# Patient Record
Sex: Female | Born: 1970 | Race: Black or African American | Hispanic: No | Marital: Single | Smoking: Former smoker
Health system: Southern US, Community
[De-identification: ages and names within clinical notes are randomized; demographics above are authoritative.]

## PROBLEM LIST (undated history)

## (undated) DIAGNOSIS — K219 Gastro-esophageal reflux disease without esophagitis: Secondary | ICD-10-CM

## (undated) DIAGNOSIS — I10 Essential (primary) hypertension: Secondary | ICD-10-CM

## (undated) DIAGNOSIS — I420 Dilated cardiomyopathy: Secondary | ICD-10-CM

## (undated) DIAGNOSIS — J302 Other seasonal allergic rhinitis: Secondary | ICD-10-CM

## (undated) DIAGNOSIS — M47816 Spondylosis without myelopathy or radiculopathy, lumbar region: Secondary | ICD-10-CM

## (undated) DIAGNOSIS — I509 Heart failure, unspecified: Secondary | ICD-10-CM

## (undated) DIAGNOSIS — Z5189 Encounter for other specified aftercare: Secondary | ICD-10-CM

## (undated) DIAGNOSIS — M479 Spondylosis, unspecified: Secondary | ICD-10-CM

## (undated) DIAGNOSIS — M47819 Spondylosis without myelopathy or radiculopathy, site unspecified: Secondary | ICD-10-CM

## (undated) HISTORY — DX: Spondylosis without myelopathy or radiculopathy, site unspecified: M47.819

## (undated) HISTORY — DX: Encounter for other specified aftercare: Z51.89

## (undated) HISTORY — DX: Spondylosis without myelopathy or radiculopathy, lumbar region: M47.816

## (undated) HISTORY — DX: Spondylosis, unspecified: M47.9

## (undated) HISTORY — PX: TUBAL LIGATION: SHX77

## (undated) HISTORY — PX: ABDOMINAL HYSTERECTOMY: SHX81

## (undated) HISTORY — PX: SPINE SURGERY: SHX786

---

## 1998-01-28 ENCOUNTER — Encounter: Admission: RE | Admit: 1998-01-28 | Discharge: 1998-04-28 | Payer: Self-pay | Admitting: Orthopedic Surgery

## 1999-03-15 ENCOUNTER — Other Ambulatory Visit: Admission: RE | Admit: 1999-03-15 | Discharge: 1999-03-15 | Payer: Self-pay | Admitting: *Deleted

## 1999-04-11 HISTORY — PX: CHOLECYSTECTOMY: SHX55

## 2000-01-05 ENCOUNTER — Encounter: Payer: Self-pay | Admitting: Gastroenterology

## 2000-01-05 ENCOUNTER — Ambulatory Visit (HOSPITAL_COMMUNITY): Admission: RE | Admit: 2000-01-05 | Discharge: 2000-01-05 | Payer: Self-pay | Admitting: Gastroenterology

## 2000-01-24 ENCOUNTER — Observation Stay (HOSPITAL_COMMUNITY): Admission: RE | Admit: 2000-01-24 | Discharge: 2000-01-25 | Payer: Self-pay | Admitting: *Deleted

## 2000-01-24 ENCOUNTER — Encounter (INDEPENDENT_AMBULATORY_CARE_PROVIDER_SITE_OTHER): Payer: Self-pay

## 2000-04-10 HISTORY — PX: CERVICAL SPINE SURGERY: SHX589

## 2000-04-24 ENCOUNTER — Other Ambulatory Visit: Admission: RE | Admit: 2000-04-24 | Discharge: 2000-04-24 | Payer: Self-pay | Admitting: *Deleted

## 2000-08-22 ENCOUNTER — Encounter: Admission: RE | Admit: 2000-08-22 | Discharge: 2000-09-13 | Payer: Self-pay | Admitting: Orthopedic Surgery

## 2000-09-09 ENCOUNTER — Ambulatory Visit (HOSPITAL_COMMUNITY): Admission: RE | Admit: 2000-09-09 | Discharge: 2000-09-09 | Payer: Self-pay | Admitting: Orthopedic Surgery

## 2000-09-09 ENCOUNTER — Encounter: Payer: Self-pay | Admitting: Orthopedic Surgery

## 2000-09-25 ENCOUNTER — Inpatient Hospital Stay (HOSPITAL_COMMUNITY): Admission: RE | Admit: 2000-09-25 | Discharge: 2000-09-26 | Payer: Self-pay | Admitting: Neurosurgery

## 2000-09-25 ENCOUNTER — Encounter: Payer: Self-pay | Admitting: Neurosurgery

## 2000-10-09 ENCOUNTER — Encounter: Payer: Self-pay | Admitting: Neurosurgery

## 2000-10-09 ENCOUNTER — Encounter: Admission: RE | Admit: 2000-10-09 | Discharge: 2000-10-09 | Payer: Self-pay | Admitting: Neurosurgery

## 2004-03-01 ENCOUNTER — Ambulatory Visit (HOSPITAL_COMMUNITY): Admission: RE | Admit: 2004-03-01 | Discharge: 2004-03-01 | Payer: Self-pay | Admitting: Obstetrics & Gynecology

## 2004-05-11 ENCOUNTER — Ambulatory Visit (HOSPITAL_COMMUNITY): Admission: RE | Admit: 2004-05-11 | Discharge: 2004-05-11 | Payer: Self-pay | Admitting: Obstetrics and Gynecology

## 2004-05-29 ENCOUNTER — Ambulatory Visit (HOSPITAL_COMMUNITY): Admission: RE | Admit: 2004-05-29 | Discharge: 2004-05-29 | Payer: Self-pay | Admitting: Obstetrics and Gynecology

## 2004-08-16 ENCOUNTER — Inpatient Hospital Stay (HOSPITAL_COMMUNITY): Admission: AD | Admit: 2004-08-16 | Discharge: 2004-08-19 | Payer: Self-pay | Admitting: Obstetrics and Gynecology

## 2004-12-04 ENCOUNTER — Emergency Department (HOSPITAL_COMMUNITY): Admission: EM | Admit: 2004-12-04 | Discharge: 2004-12-04 | Payer: Self-pay | Admitting: Emergency Medicine

## 2004-12-08 ENCOUNTER — Ambulatory Visit (HOSPITAL_COMMUNITY): Admission: RE | Admit: 2004-12-08 | Discharge: 2004-12-08 | Payer: Self-pay | Admitting: Orthopaedic Surgery

## 2006-07-24 ENCOUNTER — Emergency Department (HOSPITAL_COMMUNITY): Admission: EM | Admit: 2006-07-24 | Discharge: 2006-07-24 | Payer: Self-pay | Admitting: Emergency Medicine

## 2006-09-12 ENCOUNTER — Ambulatory Visit (HOSPITAL_COMMUNITY): Admission: RE | Admit: 2006-09-12 | Discharge: 2006-09-12 | Payer: Self-pay | Admitting: Obstetrics & Gynecology

## 2006-12-06 ENCOUNTER — Ambulatory Visit: Payer: Self-pay | Admitting: Physician Assistant

## 2006-12-06 ENCOUNTER — Inpatient Hospital Stay (HOSPITAL_COMMUNITY): Admission: AD | Admit: 2006-12-06 | Discharge: 2006-12-06 | Payer: Self-pay | Admitting: Obstetrics and Gynecology

## 2006-12-14 ENCOUNTER — Other Ambulatory Visit: Admission: RE | Admit: 2006-12-14 | Discharge: 2006-12-14 | Payer: Self-pay | Admitting: Obstetrics and Gynecology

## 2007-01-04 ENCOUNTER — Inpatient Hospital Stay (HOSPITAL_COMMUNITY): Admission: AD | Admit: 2007-01-04 | Discharge: 2007-01-04 | Payer: Self-pay | Admitting: Obstetrics and Gynecology

## 2007-02-25 ENCOUNTER — Ambulatory Visit: Payer: Self-pay | Admitting: Obstetrics & Gynecology

## 2007-02-25 ENCOUNTER — Inpatient Hospital Stay (HOSPITAL_COMMUNITY): Admission: AD | Admit: 2007-02-25 | Discharge: 2007-02-26 | Payer: Self-pay | Admitting: Obstetrics & Gynecology

## 2007-03-04 ENCOUNTER — Ambulatory Visit: Payer: Self-pay | Admitting: Physician Assistant

## 2007-03-04 ENCOUNTER — Inpatient Hospital Stay (HOSPITAL_COMMUNITY): Admission: AD | Admit: 2007-03-04 | Discharge: 2007-03-06 | Payer: Self-pay | Admitting: Obstetrics and Gynecology

## 2007-03-14 ENCOUNTER — Inpatient Hospital Stay (HOSPITAL_COMMUNITY): Admission: AD | Admit: 2007-03-14 | Discharge: 2007-03-15 | Payer: Self-pay | Admitting: Obstetrics and Gynecology

## 2007-04-29 ENCOUNTER — Ambulatory Visit (HOSPITAL_COMMUNITY): Admission: RE | Admit: 2007-04-29 | Discharge: 2007-04-29 | Payer: Self-pay | Admitting: Obstetrics & Gynecology

## 2007-05-05 ENCOUNTER — Emergency Department (HOSPITAL_COMMUNITY): Admission: EM | Admit: 2007-05-05 | Discharge: 2007-05-05 | Payer: Self-pay | Admitting: Emergency Medicine

## 2007-12-26 ENCOUNTER — Other Ambulatory Visit: Admission: RE | Admit: 2007-12-26 | Discharge: 2007-12-26 | Payer: Self-pay | Admitting: Obstetrics & Gynecology

## 2008-03-19 DIAGNOSIS — I509 Heart failure, unspecified: Secondary | ICD-10-CM

## 2008-03-19 HISTORY — DX: Heart failure, unspecified: I50.9

## 2008-05-05 ENCOUNTER — Encounter: Payer: Self-pay | Admitting: Internal Medicine

## 2008-05-11 ENCOUNTER — Encounter: Payer: Self-pay | Admitting: Internal Medicine

## 2008-06-08 ENCOUNTER — Encounter: Payer: Self-pay | Admitting: Internal Medicine

## 2009-01-25 ENCOUNTER — Other Ambulatory Visit: Admission: RE | Admit: 2009-01-25 | Discharge: 2009-01-25 | Payer: Self-pay | Admitting: Obstetrics and Gynecology

## 2009-02-04 ENCOUNTER — Emergency Department (HOSPITAL_COMMUNITY): Admission: EM | Admit: 2009-02-04 | Discharge: 2009-02-04 | Payer: Self-pay | Admitting: Emergency Medicine

## 2009-06-07 ENCOUNTER — Emergency Department (HOSPITAL_COMMUNITY): Admission: EM | Admit: 2009-06-07 | Discharge: 2009-06-07 | Payer: Self-pay | Admitting: Emergency Medicine

## 2010-06-29 LAB — URINE CULTURE: Colony Count: >=100000

## 2010-06-29 LAB — URINE MICROSCOPIC-ADD ON

## 2010-06-29 LAB — BASIC METABOLIC PANEL
Creatinine, Ser: 0.79 mg/dL (ref 0.4–1.2)
GFR calc Af Amer: >60 mL/min (ref >60)
Sodium: 133 mEq/L — ABNORMAL LOW (ref 135–145)

## 2010-06-29 LAB — DIFFERENTIAL
Basophils Absolute: 0 10*3/uL (ref 0.0–0.1)
Eosinophils Absolute: 0 10*3/uL (ref 0.0–0.7)
Lymphs Abs: 1.1 10*3/uL (ref 0.7–4.0)
Monocytes Absolute: 0.9 10*3/uL (ref 0.1–1.0)
Monocytes Relative: 6 % (ref 3–12)

## 2010-06-29 LAB — URINALYSIS, ROUTINE W REFLEX MICROSCOPIC
Bilirubin Urine: NEGATIVE
Protein, ur: 100 mg/dL — AB
pH: 6.5 (ref 5.0–8.0)

## 2010-06-29 LAB — RAPID URINE DRUG SCREEN, HOSP PERFORMED
Amphetamines: NOT DETECTED
Barbiturates: NOT DETECTED
Benzodiazepines: POSITIVE — AB
Cocaine: NOT DETECTED

## 2010-06-29 LAB — HEPATIC FUNCTION PANEL: Albumin: 3.4 g/dL — ABNORMAL LOW (ref 3.5–5.2)

## 2010-06-29 LAB — LIPASE, BLOOD: Lipase: 14 U/L (ref 11–59)

## 2010-06-29 LAB — CBC
Hemoglobin: 14.9 g/dL (ref 12.0–15.0)
MCHC: 33.8 g/dL (ref 30.0–36.0)
MCV: 90.6 fL (ref 78.0–100.0)
Platelets: 229 10*3/uL (ref 150–400)
RDW: 14.6 % (ref 11.5–15.5)

## 2010-08-02 ENCOUNTER — Emergency Department (HOSPITAL_COMMUNITY)
Admission: EM | Admit: 2010-08-02 | Discharge: 2010-08-02 | Disposition: A | Discharge status: Home / Self Care | Payer: Medicaid Other | Attending: Emergency Medicine | Admitting: Emergency Medicine

## 2010-08-02 DIAGNOSIS — Z79899 Other long term (current) drug therapy: Secondary | ICD-10-CM | POA: Insufficient documentation

## 2010-08-02 DIAGNOSIS — I10 Essential (primary) hypertension: Secondary | ICD-10-CM | POA: Insufficient documentation

## 2010-08-02 DIAGNOSIS — I509 Heart failure, unspecified: Secondary | ICD-10-CM | POA: Insufficient documentation

## 2010-08-02 DIAGNOSIS — N949 Unspecified condition associated with female genital organs and menstrual cycle: Secondary | ICD-10-CM | POA: Insufficient documentation

## 2010-08-02 LAB — DIFFERENTIAL
Basophils Absolute: 0 10*3/uL (ref 0.0–0.1)
Eosinophils Absolute: 0.1 10*3/uL (ref 0.0–0.7)
Lymphocytes Relative: 45 % (ref 12–46)
Monocytes Absolute: 0.4 10*3/uL (ref 0.1–1.0)
Neutrophils Relative %: 46 % (ref 43–77)

## 2010-08-02 LAB — URINALYSIS, ROUTINE W REFLEX MICROSCOPIC
Bilirubin Urine: NEGATIVE
Glucose, UA: NEGATIVE mg/dL
Leukocytes, UA: NEGATIVE
Nitrite: NEGATIVE
Urobilinogen, UA: 0.2 mg/dL (ref 0.0–1.0)
pH: 6.5 (ref 5.0–8.0)

## 2010-08-02 LAB — CBC
Hemoglobin: 12.7 g/dL (ref 12.0–15.0)
MCH: 30 pg (ref 26.0–34.0)
MCHC: 34.2 g/dL (ref 30.0–36.0)
Platelets: 249 10*3/uL (ref 150–400)
RBC: 4.23 MIL/uL (ref 3.87–5.11)

## 2010-08-02 LAB — URINE MICROSCOPIC-ADD ON

## 2010-08-02 LAB — LIPASE, BLOOD: Lipase: 28 U/L (ref 11–59)

## 2010-08-02 LAB — BASIC METABOLIC PANEL
Chloride: 105 mEq/L (ref 96–112)
Creatinine, Ser: 0.83 mg/dL (ref 0.4–1.2)
Sodium: 137 mEq/L (ref 135–145)

## 2010-08-02 LAB — WET PREP, GENITAL
Trich, Wet Prep: NONE SEEN
Yeast Wet Prep HPF POC: NONE SEEN

## 2010-08-03 LAB — GC/CHLAMYDIA PROBE AMP, GENITAL: GC Probe Amp, Genital: NEGATIVE

## 2010-08-23 NOTE — Discharge Summary (Signed)
NAMEDANYIEL, CRESPIN                ACCOUNT NO.:  [id number]   MEDICAL RECORD NO.:  [id number]          PATIENT TYPE:  INP   LOCATION:  A310                          FACILITY:  [facility]   PHYSICIAN:  Tilda Burrow, M.D. DATE OF BIRTH:  24-Jul-1970   DATE OF ADMISSION:  03/14/2007  DATE OF DISCHARGE:  12/05/2008LH                               DISCHARGE SUMMARY   ADMISSION DIAGNOSIS:  Postpartum endometritis.   POSTOPERATIVE DIAGNOSIS:  Postpartum endometritis, improved.   DISCHARGE MEDICATIONS:  1. Cleocin 300 mg p.o. q.6h. x5 days.  2. Keflex 500 mg p.o. q.i.d. x5 days.   Follow up 3 days [facility] at 9 a.m.   HOSPITAL SUMMARY:  This 40 year old female was admitted from the office  with uterine tenderness and mucopurulent discharge.  White count was  12,000 and sed rate was in the 90s.  She received intravenous cefotetan  and Cleocin 600 mg q.6h. IV q.6h.  Within 24 hours there was dramatic  improvement in uterine and lower abdominal tenderness.  She was afebrile  except for a single temperature elevation at admission.  She was  tolerating a regular diet and bowel sounds were active, so she is sent  home at 3 p.m. on the 5th to follow up on Monday in our office with her  other medicines resumed as per prior to the admission.  Those include  prenatal vitamins daily, stool softener daily, iron tablets daily,  labetalol 100 mg b.i.d., and ibuprofen as needed for pain.      Tilda Burrow, M.D.  Electronically Signed     JVF/MEDQ  D:  03/15/2007  T:  03/15/2007  Job:  [id number]   cc:   [facility]

## 2010-08-23 NOTE — H&P (Signed)
Destiny Manning, Destiny Manning                ACCOUNT NO.:  [id number]   MEDICAL RECORD NO.:  [id number]          PATIENT TYPE:  INP   LOCATION:  A310                          FACILITY:  [facility]   PHYSICIAN:  Tilda Burrow, M.D. DATE OF BIRTH:  02-02-1971   DATE OF ADMISSION:  DATE OF DISCHARGE:  LH                              HISTORY & PHYSICAL   ADMISSION DIAGNOSIS:  Postpartum endometritis.   This 40 year old female now 1-1/2 weeks status post recent vaginal  delivery complicated by postpartum hemorrhage and need for manual  removal of extensive blood clots as well as transfusion of two units of  packed cells who was admitted after being seen in our office and found  to have marked uterine tenderness, abdominal guarding and equivocal  rebound tenderness with mucopurulent cervical discharge and cervical  motion tenderness.  She has been having mild chills at home.  Says the  pain had a dramatic onset on November 31, 2008 and persisting.  She has  been having chills, but has not taken her temperature with a  thermometer.  She has not had a bowel movement since she went home from  the hospital and does not recall having a bowel movement while in the  hospital.  She is tolerating a regular diet.  The patient will go home  to make arrangements for child care and will return for admission and IV  antibiotics.   PAST MEDICAL HISTORY:  1. Chronic hypertension requiring Aldomet during pregnancy as well as      Labetalol.  2. Pregnancy notable in that she had a McDonald cerclage which was      removed at [redacted] weeks gestation.   PRENATAL LABS:  Reviewed and are specifically notable for blood type B  positive, hemoglobin 14, hematocrit 43 initially dropping to 10.6 and  32.7 at 28 weeks.  She had negative cultures for gonorrhea, chlamydia  and had group B strep positive culture late in pregnancy and was treated  with antibiotics while she was in labor.   REVIEW OF SYSTEMS:  No respiratory or  musculoskeletal abnormalities  other than pain.  Pain is in the abdomen only.   PHYSICAL EXAMINATION:  Weight 158, blood pressure 138/80, pulse 80.  She  is a somber, moderately to marked uncomfortable female who rests  somewhat supinely as vertical positioning is uncomfortable.  HEENT:  Pupils equal, round, reactive.  NECK:  Supple.  CHEST:  Clear to auscultation.  Breathing unlabored.  ABDOMEN:  Bowel sounds are present with guarding noted in both lower  quadrants.  There is equivocal rebound.  She guards so much with deep  palpation that the findings are equivocal for rebound tenderness.  EXTERNAL GENITALIA:  Normal without obstetric lacerations, multiparous.  There is a generous profuse mucopurulent discharge with malodor.  Wet  prep is performed and is negative for Trichomonas, but does show  extensive white cells.  The cervix is patulous consistent with  postpartum state and shows no evidence of the cerclage.  There is marked  cervical motion tenderness to cervical contact.  RECTAL:  Deferred.  There is  no stool felt in the posterior rectal  vault.   PLAN:  Admit.  IV antibiotics.  Hopeful quick turn around in the  patient's status.  She will pump in order to maintain breast feeding  status while in the hospital.      Tilda Burrow, M.D.  Electronically Signed     JVF/MEDQ  D:  03/14/2007  T:  03/14/2007  Job:  [id number]

## 2010-08-23 NOTE — Discharge Summary (Signed)
NAMEKARRY, BARRILLEAUX                ACCOUNT NO.:  [id number]   MEDICAL RECORD NO.:  [id number]          PATIENT TYPE:  INP   LOCATION:  9128                          FACILITY:  [facility]   PHYSICIAN:  Phil D. Okey Dupre, M.D.     DATE OF BIRTH:  1970/10/28   DATE OF ADMISSION:  03/04/2007  DATE OF DISCHARGE:  03/06/2007                               DISCHARGE SUMMARY   ADMISSION DIAGNOSES:  1. Intrauterine gestation at 39 weeks 1 day.  2. Spontaneous rupture of membranes.  3. Group B streptococcus positive.  4. History of incompetent cervix, status post cerclage in this      pregnancy that was removed at 37 weeks.   DISCHARGE DIAGNOSES:  1. Postpartum day #2, spontaneous vaginal delivery.  2. Postpartum hemorrhage.  3. Anemia, requiring two units packed red blood cell transfusion.   PROCEDURES:  1. NST performed.  2. Transfusion of two units of packed red blood cells on March 06, 2007.  3. Epidural.   COMPLICATIONS:  The patient did have a postpartum hemorrhage of about  500 mL, requiring Pitocin, bimanual massage and Cytotec.  Her hemoglobin  did drop to 5.8, requiring blood transfusion.   CONSULTATIONS:  None.   PERTINENT LABORATORY FINDINGS:  Patient had admission CBC with white  blood cell count 11.4, hemoglobin 11.4, hematocrit 32.6, platelets 288.  RPR was nonreactive.  She had a followup CBC the morning after delivery  with hemoglobin 10.8, hematocrit 30.6, platelet count 281.  This was  followed after a postpartum hemorrhage with hemoglobin 8.  On postpartum  day #2, hemoglobin was again checked; it was 5.8, hematocrit was 16.7,  platelets were 207.  She was typed and crossed with blood type B-  positive, antibody screen negative prior to transfusion.   BRIEF PERTINENT ADMISSION HISTORY:  This is a 40 year old gravida 3,  para 1-0-1-1 at 59 weeks 1 day gestation, who receives prenatal care at  [facility] in [place], who presents with spontaneous rupture  of  membrane and contractions.  She was found to be grossly ruptured and  cervical exam was 3 cm dilation, 70% effaced with -2 station and vertex.  Patient was admitted at this point.   HOSPITAL COURSE:  Patient was admitted to labor and delivery.  She  received penicillin for GBS positive and got epidural shortly after  admission.  Patient did require Pitocin augmentation of her labor, as  she was progressing rather slowly.  She did progress to complete and  deliver a viable infant female, weighing 7 pounds 12 ounces, with Apgars  of 8 at one minute, 9 at five minutes, at 2205 on November 24.  Placenta  delivered intact with three-vessel cord.  Approximately ten hours after  delivery, patient developed postpartum hemorrhage, consisting of several  large clots.  She required Pitocin 20 units added to 500 mL of normal  saline, run wide open, Cytotec 1000 micrograms per rectum and then  another 20 units of additional Pitocin in a liter of LR.  The bleeding  stopped and was minimal.  She  was followed with hemoglobin at that  point, which dropped to 5.8.  On postpartum day #2, she was transfused  two units packed red blood cells due to her significant hemoglobin drop.  Patient was asymptomatic, without syncope, light-headedness, dizziness.  She did have chronic hypertension and was being treated prenatally with  Aldomet and Labetalol.  Those medicines were held when she was anemic.  She was in stable condition to go home with normal physical examination.  Blood pressure was slightly elevated, 130-140s/80s.   DISCHARGE STATUS:  Stable.   DISCHARGE MEDICATIONS:  1. Ferrous sulfate 325 mg one p.o. daily.  2. Colace 100 mg one tablet p.o. twice daily.  3. Motrin 600 mg one tablet by mouth every 6 hours as needed for pain.  4. Percocet 5/325 one to two tablets every 6 hours as needed for pain,      dispense #25, no refills.  5. Aldomet 1000 mg twice daily.  6. Labetalol 100 mg twice daily.  Of  note:  She is to start her      antihypertensives this evening.  7. She can continue her Effexor as prescribed.   DISCHARGE INSTRUCTIONS:  1. Discharged home.  2. Regular diet.  3. Activity as tolerated.  No sex for six weeks or anything in the      vagina during that time.  No lifting greater than 20 pounds for the      next six weeks.   FOLLOWUP:  Patient is to follow up at the [facility] at 3 p.m.  on March 12, 2007.      Karlton Lemon, MD  Electronically Signed     ______________________________  Javier Glazier. Okey Dupre, M.D.    NS/MEDQ  D:  03/06/2007  T:  03/06/2007  Job:  [id number]   cc:   Lazaro Arms, M.D.  Fax: [phone]

## 2010-08-23 NOTE — Op Note (Signed)
Destiny Manning, Destiny Manning                ACCOUNT NO.:  [id number]   MEDICAL RECORD NO.:  [id number]           PATIENT TYPE:  AMB   LOCATION:  DAY                           FACILITY:  [facility]   PHYSICIAN:  Lazaro Arms, M.D.   DATE OF BIRTH:  January 11, 1971   DATE OF PROCEDURE:  09/12/2006  DATE OF DISCHARGE:                               OPERATIVE REPORT   PREOPERATIVE DIAGNOSES:  1. Intrauterine pregnancy at 14-1/2 weeks.  2. Incompetent cervix.   POSTOPERATIVE DIAGNOSES:  1. Intrauterine pregnancy at 14-1/2 weeks.  2. Incompetent cervix.   PROCEDURE:  Placement of a 5 mm tape, McDonald cerclage.   ANESTHESIA:  Spinal.   FINDINGS:  The patient had a incompetent loss in the past with a  cerclage with her last pregnancy which was successful.  She is admitted  again today for placement of her McDonald cerclage.   DESCRIPTION OF PROCEDURE:  The patient was taken to the operating room,  placed in the sitting position and underwent spinal anesthetic and  placed in the dorsal lithotomy position with candy cane stirrups.  The  lower abdomen, inner thighs, perineum and vagina were  prepped in the  usual fashion.  A weight speculum was placed.  Curved Deavers were used.  The cervix was grasped at 12 o'clock to 6 o'clock with ring forceps.  A  5 mm Mersilene tape was used and placed in a purse-string fashion, in a  counter clock-wise fashion with the entry and exits being __________  around the cervix.  It was tied down to a tight finger tip, got about  2.5 cm cervix.  I took it all the way up to the reflexion of the  bladder.  It was not a really long cervix.  I basically got as much back  anteriorly and posteriorly as I could to the reflexion of the bladder  and rectum.  It was marked with a silk suture and sutures were cut.   The patient tolerated the procedure well.  She was taken to the recovery  room in good stable condition.  There was minimal bleeding.      Lazaro Arms, M.D.  Electronically Signed     LHE/MEDQ  D:  09/12/2006  T:  09/12/2006  Job:  [id number]

## 2010-08-23 NOTE — H&P (Signed)
NAMEUGOCHI, HENZLER NO.:  [id number]   MEDICAL RECORD NO.:  [id number]          PATIENT TYPE:  AMB   LOCATION:  DAY                           FACILITY:  [facility]   PHYSICIAN:  Lazaro Arms, M.D.   DATE OF BIRTH:  March 18, 1971   DATE OF ADMISSION:  09/12/2006  DATE OF DISCHARGE:  LH                              HISTORY & PHYSICAL   Destiny Manning is a 40 year old African American female gravida 3, para 1,  abortus 1, last menstrual period of June 30, 2006 with estimated date  of delivery of March 10, 2007.  She is currently at 14 weeks and 5  days gestation who is admitted for placement of McDonald's cerclage.  She had an incompetent cervix loss in 2003.  She had a cerclage in 2006.  She had a successful term pregnancy without any difficulty.  As a  result, she is admitted for a repeat cerclage placement.  She also has  chronic hypertension.  She is on Aldomet 1000 mg b.i.d. and Labetalol  100 mg b.i.d.   PAST MEDICAL HISTORY:  1. Hypertension.  2. History of depression.   PAST SURGICAL HISTORY:  1. Gallbladder and back surgery.  2. Cerclage.   ALLERGIES:  None.   MEDICATIONS:  Are stated as above plus Effexor XR 150 mg a day.   Blood type is B positive.  Rubella is immuned.  Hepatitis B negative.  HIV is negative.  Serology is nonreactive.  GC and Chlamydia are  negative.  Group B strep is positive from previous pregnancy.   HEENT:  Unremarkable.  Thyroid is normal.  LUNGS:  Clear.  HEART:  Regular rate and rhythm without regurgitation or gallop.  BREASTS:  Without masses, discharge or skin changes.  ABDOMEN:  Benign.  Uterus is gravid, 14 week size.  Adnexa is negative.  EXTREMITIES:  Warm with no edema.  CERVIX:  Long and thick.   IMPRESSION:  1. Intrauterine pregnancy at 14-5/7th weeks gestation.  2. Incompetent cervix.   PLAN:  The patient is admitted for placement of McDonald's cerclage.  She understands the risks, benefits, indications and  alternatives and  will proceed.      Lazaro Arms, M.D.  Electronically Signed     LHE/MEDQ  D:  09/11/2006  T:  09/12/2006  Job:  [id number]

## 2010-08-23 NOTE — Op Note (Signed)
Destiny Manning, [place]                ACCOUNT NO.:  [id number]   MEDICAL RECORD NO.:  [id number]          PATIENT TYPE:  AMB   LOCATION:  DAY                           FACILITY:  [facility]   PHYSICIAN:  Lazaro Arms, M.D.   DATE OF BIRTH:  May 31, 1970   DATE OF PROCEDURE:  04/29/2007  DATE OF DISCHARGE:                               OPERATIVE REPORT   PREOPERATIVE DIAGNOSIS:  Multiparous female who desires permanent  sterilization.   POSTOPERATIVE DIAGNOSIS:  Multiparous female who desires permanent  sterilization.   PROCEDURE:  Laparoscopic tubal ligation using electrocautery.   SURGEON:  Dr. Despina Hidden.   ANESTHESIA:  General endotracheal.   FINDINGS:  Normal uterus, tubes, and ovaries.   DESCRIPTION OF OPERATION:  The patient was taken to the operating room  and placed in a supine position and underwent general endotracheal  anesthesia, placed in the dorsal lithotomy position, prepped and draped  in the usual sterile fashion. Hulka tenaculum was placed for uterine  manipulation. Incision was made in the umbilicus. Veress needle was  used. Peritoneal cavity was inflated. A 10/11 nonbladder trocar was  used, direct visualization, placed in the peritoneal cavity without  difficulty. The tubes were identified, burned to no resistance and  beyond the distal isthmic ampullary region of each tube 3.5 cm  bilaterally.  There was good hemostasis. The instruments were removed.  The gas was allowed to escape. The fascia was closed with a single.  The  skin was closed using skin staples. The patient tolerated the procedure  well. She experienced minimal blood loss and taken to the recovery room  in good and stable condition. All counts were correct x3.      Lazaro Arms, M.D.  Electronically Signed     LHE/MEDQ  D:  04/29/2007  T:  04/29/2007  Job:  [id number]

## 2010-08-26 NOTE — Op Note (Signed)
NAMEJASMAINE, Destiny Manning                ACCOUNT NO.:  [id number]   MEDICAL RECORD NO.:  [id number]          PATIENT TYPE:  AMB   LOCATION:  DAY                           FACILITY:  [facility]   PHYSICIAN:  Lazaro Arms, M.D.   DATE OF BIRTH:  10/15/1970   DATE OF PROCEDURE:  03/01/2004  DATE OF DISCHARGE:                                 OPERATIVE REPORT   PREOPERATIVE DIAGNOSIS:  Incompetent cervix, intrauterine pregnancy at 65-  1/2 weeks.   POSTOPERATIVE DIAGNOSIS:  Incompetent cervix, intrauterine pregnancy at 14-  1/2 weeks.   OPERATION PERFORMED:  Placement of McDonald cerclage.   SURGEON:  Lazaro Arms, M.D.   ANESTHESIA:  Spinal.   FINDINGS:  The patient had an 18 week pregnancy loss with her last pregnancy  which was consistent with an incompetent cervix.  As a result, she is  admitted for a placement of a McDonald cerclage prophylactically.   DESCRIPTION OF PROCEDURE:  The patient was taken to the operating room and  placed in the sitting position where she underwent spinal anesthetic.  She  was then placed in dorsal lithotomy position, prepped and draped in the  usual sterile fashion.  Her bladder was drained prior to the procedure.  A  weighted speculum was placed.  Deaver retractors were placed and the cervix  was grasped with a ring forceps.  A 5 mm Mersilene tape was then used and  then placed at five different points from 12 o'clock counterclockwise around  to 12 o'clock again at five different locations.  It was then tied down  tightly and kept together with a 2-0 silk suture to keep it from unraveling  and to also be able to identify it as the cervix gets larger.  The patient  tolerated the procedure well.  She experienced no blood loss.  She was taken  to recovery room in good stable condition.  All counts were correct.     Luth   LHE/MEDQ  D:  03/01/2004  T:  03/01/2004  Job:  [id number]

## 2010-08-26 NOTE — Discharge Summary (Signed)
Destiny Manning, Destiny Manning                ACCOUNT NO.:  [id number]   MEDICAL RECORD NO.:  [id number]          PATIENT TYPE:  OIB   LOCATION:  LDR1                          FACILITY:  [facility]   PHYSICIAN:  Langley Gauss, MD     DATE OF BIRTH:  1971-03-13   DATE OF ADMISSION:  05/29/2004  DATE OF DISCHARGE:  02/19/2006LH                                 DISCHARGE SUMMARY   HISTORY:  A 40 year old gravida 2, para 1.  Her EDC is September 12, 2004, comes  in complaining of five days duration of sinus congestion, as well as non-  productive cough.  She has recently decreased her cigarette smoking  consumption.  Prenatally, she is complicated by positive marijuana screen,  cigarette smoking, also a history of consistent with incompetent cervix.  She did have a McDonald cerclage placed on March 01, 2004, one stitch was  utilized.  The patient has been prescribed a non-antibiotic medication four  days previously which has failed to give any significant resolution of  symptoms.   PAST MEDICAL HISTORY:  She denies any prior history of asthma, she denies  any current wheezing, shortness of breath, or tightness in the chest.  She  has not utilized any other over-the-counter medications.   PHYSICAL EXAMINATION:  GENERAL:  In no acute distress.  HEENT:  Sinuses are nontender, but according to the patient feel full.  NECK:  Supple.  Thyroid is not palpable.  No significant adenopathy.  LUNGS:  Clear to auscultation.   Fetal heart tones had been auscultated by the nursing staff, noted to be  within the 150s.   ASSESSMENT AND PLAN:  Patient with symptoms consistent with acute bacterial  sinusitis with nonproductive cough.  She is at this point in time given a  prescription for a Z-pack as well as Tussionex 5 mL p.o. b.i.d. p.r.n. for  cough.  She is advised that there is sedative properties associated with the  use of the Tussionex, as she is working as a Surveyor, mining, she is  advised that during daytime  she can continue with the Z-Pak, as well as over-  the-counter Sudafed.  She is advised to continue care and follow up with  [facility] as clinically indicated.      DC/MEDQ  D:  05/29/2004  T:  05/29/2004  Job:  [id number]

## 2010-08-26 NOTE — Op Note (Signed)
[facility]  Patient:    Destiny Manning, Destiny Manning                       MRN: [id number] Proc. Date: 01/24/00 Adm. Date:  [id number] Attending:  Stephenie Acres                           Operative Report  PREOPERATIVE DIAGNOSIS:  Symptomatic chololithiasis.  POSTOPERATIVE DIAGNOSIS:  Symptomatic chololithiasis.  OPERATION:  Laparoscopic cholecystectomy.  SURGEON:  Catalina Lunger, M.D.  ASSISTANT:  Milus Mallick, M.D.  ANESTHESIA:  General.  DESCRIPTION OF PROCEDURE:  The patient was taken to the operating room and placed in the supine position.  After adequate anesthesia was induced using endotracheal tube, the abdomen was prepped and draped in a normal sterile fashion.  Using a transverse infraumbilical incision I dissected down to the fascia.  The fascia was opened vertically.  An 0 Vicryl pursestring suture was placed around the fascial defect.  Hasson trocar was placed in the abdomen and the abdomen was insufflated with carbon dioxide.  Under direct visualization a 10 mm port was placed in the subxiphoid region and two 5 mm ports were placed in the right abdomen.  The gallbladder was identified and retracted cephalad. Dissection began at the neck of the gallbladder until the cystic duct was easily identified, measured approximately 1 to 2 mm in diameter.  It was dissected free, triply clipped and divided.  The cystic artery which laid just posterior to this, was identified, triply clipped and divided.  The gallbladder was taken out of the gallbladder bed using Bovie electrocautery. A small rent was made just back of the gallbladder wall and there was some bilious spillage.  After the gallbladder had been removed off the gallbladder bed it was placed in an EndoCatch bag and removed through the umbilical port. The right upper quadrant was copiously irrigated.  Adequate hemostasis was ensured.  All trocars were removed.  The abdomen was  allowed to deflate. Incisions were injected using 0.5% Marcaine.  Fascial defect was closed with the 0 Vicryl pursestring sutures.  Skin incisions were closed with subcuticular 4-0 Monocryl.  Steri-Strips and sterile dressings were applied. The patient tolerated the procedure well and went to PACU in good condition. DD:  01/24/00 TD:  01/25/00 Job: [id number]

## 2010-08-26 NOTE — H&P (Signed)
[place]. [facility]  Patient:    Destiny Manning, Destiny Manning                         MRN: [id number] Adm. Date:  09/25/00 Attending:  Tanya Nones. Jeral Fruit, M.D.                         History and Physical  HISTORY OF PRESENT ILLNESS:  Ms. Dade is a 40 year old female who about 10 years ago fell at home and from then on, she developed neck pain.  The pain has been getting worse lately, is radiating down to the arm.  Patient has had conservative treatment without any improvement.  The pain is worse in the left side of the neck, going to the left arm, all the way down to the left fingers, associated with numbness, stiffness and swelling.  Although she complains of some pain in the right arm, mostly it is in the left one.  She denies any problem with bladder or bowel.  Lately, she had been having some burning sensation in the right leg.  PAST MEDICAL HISTORY:  Cholecystectomy a year ago.  ALLERGIES:  She is not allergic to any medications.  SOCIAL HISTORY:  She smokes one cigarette a day.  She does drink socially. She is 5 feet 3 inches and she is 171 pounds.  FAMILY HISTORY:  Positive for high blood pressure and diabetes.  REVIEW OF SYSTEMS:  Only positive for neck and arm pain.  PHYSICAL EXAMINATION:  HEENT:  Normal.  NECK:  She is able to flex but extension and lateralization produce pain that goes to the left shoulder.  LUNGS:  Clear.  HEART:  Heart sounds normal.  ABDOMEN:  Normal.  EXTREMITIES:  Normal pulses.  NEUROLOGIC:  Mental status normal.  Cranial nerves normal.  Strength:  I can break easily both biceps and both wrist extensors, with normal deltoids and triceps.  There is no atrophy or fasciculation.  Reflexes are symmetrical and 1+.  Sensation is grossly normal.  Patient is able to walk on tiptoes and heels and squat without any problem.  IMAGING STUDY:  The MRI showed that indeed she has a lack of the normal lordosis and she has a large  herniated disk with foraminal stenosis and narrowing of the canal at the level of 5-6.  CLINICAL IMPRESSION:  C5-C6 herniated disk with foraminal stenosis.  RECOMMENDATION:  The patient is being admitted for anterior cervical diskectomy with bone graft and plate.  She knows about the risks such as infection, CSF leak, worsening of the pain, paralysis, failure of the graft, failure of the plate and need for further surgery. DD:  09/25/00 TD:  09/25/00 Job: [id number]

## 2010-08-26 NOTE — Op Note (Signed)
[place]. [facility]  Patient:    Destiny Manning, Destiny Manning                         MRN: [id number] Proc. Date: 09/25/00 Adm. Date:  09/25/00 Attending:  Tanya Nones. Jeral Fruit, M.D.                           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  PROCEDURE:  Anterior 5-6 diskectomy, decompression of the spinal cord, bilateral foraminotomy, a bone graft, and Preimer plate with fluoroscope. Microscope.  SURGEON:  Tanya Nones. Jeral Fruit, M.D.  ASSISTANT:  Stefani Dama, M.D.  CLINICAL HISTORY:  [name] is a lady who has been complaining of neck and left upper extremity pain for almost 10 years.  She has failed conservative treatment.  Clinically she has weakness in both biceps.  X-rays show stenosis and herniated disk and spondylosis at the L5-6 with foraminal stenosis.  The patient wants to go ahead with surgery.  The patient knew of the risks as explained in the history and physical.  PROCEDURE:  The patient was taken to the OR and after intubation which was done ______ position the left side of the neck was prepped with Betadine. Transverse incision through the skin and platysma was carried out until we found the cervical spine.  X-rays showed that we were at the level of 4-5. From then it was easy to identify the 5-6 space.  The anterior ligament was opened and total gross diskectomy with removal of degenerative disk was accomplished.  Indeed we found quite a bit of spondylosis mostly medial and lateral.  With the drill we drilled the bony spur and we did a bilateral foraminotomy using the 1 and 2, and 3 mm Kerrison punch.  Having good decompression of the spinal cord as well as good foraminotomy the end plate was drilled.  A bone graft of 7 mm in height was inserted.  It was from the  bone bank iliac crest.  Then using a Preimer plate using 4 screws was done.  The first one was a little bit longer and we changed to a smaller plate.  The latest x-ray showed good position of the bone graft of the plate as well as screws.  ______ the esophagus, trachea and carotid were normal. The wound was closed with Vicryl and Steri-Strips. DD:  09/25/00 TD:  09/25/00 Job: [id number]

## 2010-08-26 NOTE — Op Note (Signed)
NAME:  GWENLYN, HOTTINGER NO.:  [id number]   MEDICAL RECORD NO.:  [id number]          PATIENT TYPE:  INP   LOCATION:  LDR1                          FACILITY:  [facility]   PHYSICIAN:  Lazaro Arms, M.D.   DATE OF BIRTH:  [birth year]   DATE OF PROCEDURE:  08/16/2004  DATE OF DISCHARGE:                                 OPERATIVE REPORT   Destiny Manning is a 40 year old African-American female, gravida 2, para 0, abortus  1 at term who came in as a spontaneous rupture of membranes in labor. She is  requesting epidural be placed. She is placed in sitting position. Betadine  prep was used. L2-L2 interspace is identified. One percent lidocaine was  injected. The area was field draped. A 17-gauge Tuohy needle was used, and  loss-of-resistance technique employed, and the epidural space is found  without difficulty. Ten cc of 0.125% bupivacaine plain was given as a test  dose without ill effects. In addition, 10 cc was given as a dose of the  epidural. The epidural catheter was fed without difficulty, taped down 5 cm  into the epidural space. Continuous infusion of 0.125% bupivacaine with 2  mg/cc of fentanyl was given at 12 cc an hour. The patient will undergo  routine pregnancy management from here.      LHE/MEDQ  D:  08/16/2004  T:  08/16/2004  Job:  [id number]

## 2010-08-26 NOTE — H&P (Signed)
NAME:  Destiny Manning, Destiny Manning NO.:  [id number]   MEDICAL RECORD NO.:  [id number]          PATIENT TYPE:  AMB   LOCATION:  DAY                           FACILITY:  [facility]   PHYSICIAN:  Lazaro Arms, M.D.   DATE OF BIRTH:  1971-01-24   DATE OF ADMISSION:  DATE OF DISCHARGE:  LH                                HISTORY & PHYSICAL   October is a 40 year old African-American female, gravida 2, para 0, abortus  1, currently at 14-1/[redacted] weeks gestation who lost her first pregnancy in March  2003 due to an incompetent cervix loss at 18 weeks.  The patient never had  any cervical surgery, never had a D&C but indeed did have a confirmed  incompetent cervix while her cervix in the office was 3.2 cm.   PAST MEDICAL HISTORY:  Depression.   PAST SURGICAL HISTORY:  Cervical disk and cholecystectomy.   PAST OB HISTORY:  As stated above.   ALLERGIES:  None.   MEDICATIONS:  Celexa.   REVIEW OF SYSTEMS:  Negative.   LABORATORY AND X-RAY DATA:  She is group B strep negative, and cultures of  both GC and chlamydia are both negative.   Her ultrasounds have been normal   PHYSICAL EXAMINATION:  HEENT:  Unremarkable.  NECK:  Thyroid is normal  LUNGS:  Clear.  HEART:  Regular rate and rhythm without murmur, regurgitation, or gallop.  BREASTS:  Without mass, discharge, skin changes.  ABDOMEN:  Benign.  PELVIC:  Exam is normal.  EXTREMITIES:  Warm with no edema.  NEUROLOGIC:  Exam is grossly intact.   IMPRESSION:  1.  Intrauterine pregnancy at 14-1/[redacted] weeks gestation.  2.  Incompetent cervix.   PLAN:  The patient is admitted for placement of McDonald cerclage.     [place]   LHE/MEDQ  D:  02/29/2004  T:  02/29/2004  Job:  [id number]

## 2010-08-26 NOTE — Op Note (Signed)
NAMEJENITA, RAYFIELD NO.:  [id number]   MEDICAL RECORD NO.:  [id number]          PATIENT TYPE:  INP   LOCATION:  LDR1                          FACILITY:  [facility]   PHYSICIAN:  Lazaro Arms, M.D.   DATE OF BIRTH:  02-Feb-1971   DATE OF PROCEDURE:  DATE OF DISCHARGE:                                 OPERATIVE REPORT   Naimah developed a sensation of pressure at approximately 7:50.  She pushed  effectively for a little over an hour and had a spontaneous vaginal delivery  at 2100.  The mouth and nose were suctioned on the perineum.  The body  delivered without difficulty.  She delivered a 6 pound, 7 ounce female  infant.  Apgars are 9 and 9.  Pitocin 20 units dilated in 1000 cc of  lactated Ringer's.  Was then infused rapidly IV.  The placenta separates  spontaneously and delivered via controlled cortex at 2100.  It was inspected  and appears to be intact with a three-vessel cord.  The fundus was firm, and  very little blood loss was noted.  The vagina was then inspected, and no  lacerations were found.  The epidural catheter was removed with the blue-tip  visualized as being intact.      FC/MEDQ  D:  08/16/2004  T:  08/16/2004  Job:  [id number]

## 2010-08-26 NOTE — H&P (Signed)
Destiny Manning, Destiny Manning                ACCOUNT NO.:  [id number]   MEDICAL RECORD NO.:  [id number]          PATIENT TYPE:  INP   LOCATION:  LDR1                          FACILITY:  [facility]   PHYSICIAN:  Lazaro Arms, M.D.   DATE OF BIRTH:  04-04-1971   DATE OF ADMISSION:  08/16/2004  DATE OF DISCHARGE:  LH                                HISTORY & PHYSICAL   CHIEF COMPLAINT:  Spontaneous rupture of membranes at 0800.   HISTORY OF PRESENT ILLNESS:  Destiny Manning is a 40 year old gravida 2, para 0 with  an 18-week AB due to incompetent cervix with an EDC of 08/25/04 which was  ultrasound assigned due to size being greater than dates.  That correlates  nicely with her secondary to trimester ultrasound also, placing her at 22-  1/[redacted] weeks gestational age.  Destiny Manning began prenatal care in her first  trimester and has had regular visits throughout.  She did have a McDonald  cerclage placed due to a history of incompetent cervix.  Her prenatal course  has been uncomplicated.   PRENATAL LABS:  Blood type B+.  Urine drug screen positive for marijuana.  Rubella is immune.  HB, SAG, HIV, RPR, gonorrhea and Chlamydia were  negative.  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  She also had a positive  group B strep.  Prenatal blood pressures have been 120s-150s/60s-80s.  Protein in her urine has always been negative.  Total weight gain has been  about 30 pounds with appropriate fundal height growth.  Estimated fetal  weight is about 7-1/2 to 8 pounds by [place].  She did have a history of a  first trimester complete previa which has since moved up and is now low  anterior.  She has had no bleeding episodes throughout the pregnancy.   She was placed on Valtrex 1 gm p.o. b.i.d. yesterday for prophylaxis.  After  careful inspection, there are no identifiable lesions at this time.   PAST MEDICAL HISTORY:  Positive for depression and incompetent cervix.  Also  positive for Chlamydia and Trichomonas which were  treated.   PAST SURGICAL HISTORY:  She had a cholecystectomy and cervical disk fixed.   ALLERGIES:  No known drug allergies.   MEDICATIONS:  Celexa 40 mg a day.   SOCIAL HISTORY:  Engaged to be married, unemployed.  Lives with parents.  She has two stepdaughters, the father of the baby is supportive.  She denies  cigarette smoking, alcohol or drug use.   OB HISTORY:  Positive for an incompetent cervix in March 2003 at 18 weeks in  [place].   PHYSICAL EXAMINATION:  HEENT:  Within normal limits.  HEART:  Regular rate and rhythm.  LUNGS:  Clear.  ABDOMEN:  Soft and nontender.  Having mild contractions every 2-3 minutes.  Fetal heart rate is reactive without decelerations, leaking moderate amount  of clear fluid.  No identifiable HSV lesions.  EXTREMITIES:  Legs, trace edema.   IMPRESSION:  1.  Intrauterine pregnancy at 38-1/2 weeks.  2.  Spontaneous rupture of membranes.  3.  Beginning labor.  PLAN:  The patient has just received an epidural and is quite comfortable.  We will start her on Pitocin to augment her labor seeing as how she has been  2-3 cm for the past 4-5 hours despite contractions.  She is now about 3 cm,  100% effaced, -1 station.      FC/MEDQ  D:  08/16/2004  T:  08/16/2004  Job:  [id number]   cc:   [facility]

## 2010-10-13 ENCOUNTER — Encounter (HOSPITAL_COMMUNITY): Payer: Self-pay

## 2010-10-13 ENCOUNTER — Encounter (HOSPITAL_COMMUNITY)
Admission: RE | Admit: 2010-10-13 | Discharge: 2010-10-13 | Disposition: A | Discharge status: Home / Self Care | Payer: Medicaid Other | Source: Ambulatory Visit | Attending: Obstetrics & Gynecology | Admitting: Obstetrics & Gynecology

## 2010-10-13 ENCOUNTER — Other Ambulatory Visit: Payer: Self-pay | Admitting: Obstetrics & Gynecology

## 2010-10-13 HISTORY — DX: Heart failure, unspecified: I50.9

## 2010-10-13 HISTORY — DX: Other seasonal allergic rhinitis: J30.2

## 2010-10-13 HISTORY — DX: Dilated cardiomyopathy: I42.0

## 2010-10-13 HISTORY — DX: Gastro-esophageal reflux disease without esophagitis: K21.9

## 2010-10-13 HISTORY — DX: Essential (primary) hypertension: I10

## 2010-10-13 LAB — URINALYSIS, ROUTINE W REFLEX MICROSCOPIC
Bilirubin Urine: NEGATIVE
Glucose, UA: NEGATIVE mg/dL
Specific Gravity, Urine: 1.01 (ref 1.005–1.030)
Urobilinogen, UA: 0.2 mg/dL (ref 0.0–1.0)

## 2010-10-13 LAB — CBC
MCH: 29.8 pg (ref 26.0–34.0)
Platelets: 264 10*3/uL (ref 150–400)
RBC: 4.36 MIL/uL (ref 3.87–5.11)
WBC: 7 10*3/uL (ref 4.0–10.5)

## 2010-10-13 LAB — COMPREHENSIVE METABOLIC PANEL
Albumin: 4 g/dL (ref 3.5–5.2)
Alkaline Phosphatase: 61 U/L (ref 39–117)
BUN: 13 mg/dL (ref 6–23)
Creatinine, Ser: 0.94 mg/dL (ref 0.50–1.10)
GFR calc Af Amer: >60 mL/min (ref >60)
Glucose, Bld: 85 mg/dL (ref 70–99)
Potassium: 3.9 mEq/L (ref 3.5–5.1)
Total Protein: 7.6 g/dL (ref 6.0–8.3)

## 2010-10-13 LAB — URINE MICROSCOPIC-ADD ON

## 2010-10-13 LAB — SURGICAL PCR SCREEN
MRSA, PCR: NEGATIVE
Staphylococcus aureus: NEGATIVE

## 2010-10-13 NOTE — Patient Instructions (Signed)
20 Destiny Manning  10/13/2010   Your procedure is scheduled on:10/19/10  Report to [facility] at 10:40 AM.  Call this number if you have problems the morning of surgery: [phone]   Remember:   Do not eat food:After Midnight.  Do not drink clear liquids: After Midnight.  Take these medicines the morning of surgery with A SIP OF WATER: digoxin, coreg, aldactone, singulair, ramipril, nexium, ventolin. Please             Bring inhalers.   Do not wear jewelry, make-up or nail polish.  Do not bring valuables to the hospital.  Contacts, dentures or bridgework may not be worn into surgery.  Leave suitcase in the car. After surgery it may be brought to your room.  For patients admitted to the hospital, checkout time is 11:00 AM the day of discharge.   Patients discharged the day of surgery will not be allowed to drive home.  Name and phone number of your driver: Edgardo Roys, Mom  Special Instructions: CHG Shower Shower 2 days before surgery and 1 day before surgery with Hibiclens.   Please read over the following fact sheets that you were given: Pain Booklet, Coughing and Deep Breathing, MRSA Information, Surgical Site Infection Prevention and Anesthesia Post-op Instructions   Instructions Following General Anesthetic, Adult A nurse specialized in giving anesthesia (anesthetist) or a doctor specialized in giving anesthesia (anesthesiologist) gave you a medicine that made you sleep while a procedure was performed. For as long as 24 hours following this procedure, you may feel:  Dizzy.   Weak.   Drowsy.  AFTER YOUR SURGERY 1. After surgery, you will be taken to the recovery area where a nurse will monitor your progress. You will be allowed to go home when you are awake, stable, taking fluids well, and without complications.  2.  3. For the first 24 hours following an anesthetic:   Have a responsible person with you.   Do not drive a car. If you are alone, do not take public transportation.    Do not drink alcohol.   Do not take medicine that has not been prescribed by your caregiver.   Do not sign important papers or make important decisions.   You may resume normal diet and activities as directed.   Change bandages (dressings) as directed.   Only take over-the-counter or prescription medicines for pain, discomfort, or fever as directed by your caregiver.  If you have questions or problems that seem related to the anesthetic, call the hospital and ask for the anesthetist or anesthesiologist on call. SEEK IMMEDIATE MEDICAL CARE IF:  You develop a rash.   You have difficulty breathing.   You have chest pain.   You develop any allergic problems.  Document Released: 07/03/2000 Document Re-Released: 06/21/2009 [facility] Patient Information 2011 [place], [place].   Keeping You Safe Your doctor wants you to stay safe by knowing about your care and by understanding what is happening to you.  ABOUT YOUR MEDICINES:  At each visit, your doctor will ask about all your medicines. This is a check to be sure there are no mistakes with your medicines. Make sure you tell your doctors, nurses, and pharmacist about all the medicine you are taking including:   Prescription medicine.   Over-the-counter (non-prescription) medicines.   Sample medicines.   Vaccines.   Herbs.   Vitamins.   Nutriceuticals.   Dietary supplements.   Health or energy drinks you are taking.   Medicines you breathe in (  such as an inhaler or diskus).   Bring all your medicines with you to all your visits.   Tell your doctors, nurses and pharmacist about any allergies or bad reactions you have had to any medicines or foods.   Make sure you know about all your medicines. If you have any questions ask before you go home. This is what you should know about each medicine:   What it is called (brand name and generic name).   What it is for.   What it looks like.   When to take it.   How much  to take.   How to take it and how long to take it.   The side effects you might have. What you should do about them.   Foods or drinks you should not have while taking the medicine.   Things you should not do while taking the medicine.   If the medicine is safe to take along with all the other medicines, herbs, vitamins or dietary supplements you are taking.   If you are home and have any questions about your medicines:   Call the number on your medicine bottle.   Call the pharmacy where you get your medicines filled.   If you are getting an IV drip, know how long it should take to run in. Let your nurse know if:   It is not dripping.   It is dripping faster than before.   Your arm begins to swell around the IV needle.  ABOUT THE RISK OF INFECTION:  Hand washing is the best way to keep infections from spreading. Be sure your doctors wash their hands before and after they take care of you. You can remind them to do this, if you need to.   If you have a wound that looks like it is not getting better, or is getting worse, be sure to tell your health care provider.  ABOUT MAKING SURE DOCTORS ARE TREATING THE RIGHT PART OF THE RIGHT PERSON:  Your doctor should always ask your name and check your ID bracelet before:   Taking care of you.   Giving you medicine.   Drawing your blood.  In some clinics you may not have an ID bracelet, so your doctor should ask your name and date of birth.  If you are having surgery, make sure you or the doctor marks the place on your body (the site) where the surgery is to be done.  ABOUT THE RISK OF FALLING:  If you use a walker or need help walking at home, tell your doctor.   If you have just had surgery, or are feeling weak or faint, or there is any chance of you falling, call any doctor to help you get up and walk.   If you are in the hospital, talk to your nurse about having a family member or friend stay with you to help keep you from  falling.  ABOUT THINKING OF SUICIDE:  If you have any thoughts about hurting yourself, or that you would be better off dead, tell your doctor right away.  ABOUT YOUR HEALTH CARE:  Read any medical forms you are asked to sign. If you do not understand something, ask your doctor to explain it. Do not sign until you understand and agree.   When your doctor comes in to take care of you, they should always tell you who they are. They should also tell you what kind of doctor they are, such as  a:   Librarian, academic.   Nurse.   Therapist.   Before you leave the hospital, make sure you and your doctor knows how to take care of you at home. This means knowing about:   Medicines.   Treatments (Some examples are: changing your dressing, tube feeding, using oxygen, caring for your stoma).   Problems for which you should call your doctor, and problems for which you should call an ambulance to take you to the Emergency Room.   How to properly work any equipment you will be using at home.   If you think you may feel too sick to watch out for your own safety, ask your nurse if a trusted family member or friend may stay to help you out and ask questions for you.   If, at any time, you feel something is not going right with you or the way you are being taken care of, let your nurse know.  Easy-to-Read style based on content from [facility], [place], [place] Document Released: 03/09/2008 Document Re-Released: 01/22/2009 [facility] Patient Information 2011 [place], [place].PATIENT INSTRUCTIONS POST-ANESTHESIA  IMMEDIATELY FOLLOWING SURGERY:  Do not drive or operate machinery for the first twenty four hours after surgery.  Do not make any important decisions for twenty four hours after surgery or while taking narcotic pain medications or sedatives.  If you develop intractable nausea and vomiting or a severe headache please notify your doctor immediately.  FOLLOW-UP:  Please make an appointment  with your surgeon as instructed. You do not need to follow up with anesthesia unless specifically instructed to do so.  WOUND CARE INSTRUCTIONS (if applicable):  Keep a dry clean dressing on the anesthesia/puncture wound site if there is drainage.  Once the wound has quit draining you may leave it open to air.  Generally you should leave the bandage intact for twenty four hours unless there is drainage.  If the epidural site drains for more than 36-48 hours please call the anesthesia department.  QUESTIONS?:  Please feel free to call your physician or the hospital operator if you have any questions, and they will be happy to assist you.     [facility] Anesthesia Department [address] [place] [phone]

## 2010-10-18 NOTE — H&P (Signed)
Preoperative History and Physical  Destiny Manning is a 40 y.o. [id number] with LMP 08/12/2010, on Megestrol, admitted for a vaginal hysterectomy.  Destiny Manning has been having heavy, prolonged bleeding along with a great deal of pain since March.  She responded to Megace as far as the bleeding was concerned but not the pain.  I also treated her with antibiotics empirically with no positive results.  Ablation would not be appropriate in this clinical situation and as a result will proceed with a vaginal hysterectomy.  The patient has stable cardiomyopathy with good EF%.  Her cardiologist has approved her for surgery and prefers a spinal anesthetic.   PMH:    Past Medical History  Diagnosis Date  . GERD (gastroesophageal reflux disease)     takes Nexium Daily  . Hypertension   . CHF (congestive heart failure) 03/19/08    EF 35-40%  . Asthma dx 06/04/07    ventolin, singulair  . DCM (dilated cardiomyopathy) dx 06/06/07    EF 10-15%  . Seasonal allergies     takes zyrtec    PSH:     Past Surgical History  Procedure Date  . Cholecystectomy 2001    laparoscopic, [facility]  . Cervical spine surgery 2002    [facility], ruptured disc    Pertinent Ob/Gyn History: Menses: flow is excessive with use of 10 pads or tampons on heaviest days, irregular occurring approximately every 14 days without intermenstrual spotting and with severe dysmenorrhea Bleeding: dysfunctional uterine bleeding and menometrorrhagia Contraception: tubal ligation DES exposure: denies Blood transfusions: none Sexually transmitted diseases: no past history Previous GYN Procedures: Lap BTL  Last mammogram: Not yet age 70 Date: N/A Last pap: normal Date: 08/2010     SH:   History  Substance Use Topics  . Smoking status: Former Smoker    Quit date: 03/14/2009  . Smokeless tobacco: Never Used  . Alcohol Use: No    FH:    Family History  Problem Relation Age of Onset  . Anesthesia problems Neg Hx   . Hypotension Neg Hx   .  Pseudochol deficiency Neg Hx   . Malignant hyperthermia Neg Hx      Allergies: No Known Allergies  Medications:   No current facility-administered medications for this encounter. Current outpatient prescriptions:busPIRone (BUSPAR) 15 MG tablet, Take 30 mg by mouth daily.  , Disp: , Rfl: ;  citalopram (CELEXA) 40 MG tablet, Take 40 mg by mouth at bedtime.  , Disp: , Rfl: ;  fluticasone (FLONASE) 50 MCG/ACT nasal spray, Place 1 spray into the nose daily.  , Disp: , Rfl: ;  carvedilol (COREG) 25 MG tablet, Take 25 mg by mouth 2 (two) times daily with a meal. , Disp: , Rfl:  cetirizine (ZYRTEC) 10 MG tablet, Take 10 mg by mouth daily. , Disp: , Rfl: ;  digoxin (LANOXIN) 0.125 MG tablet, Take 125 mcg by mouth daily. , Disp: , Rfl: ;  esomeprazole (NEXIUM) 40 MG capsule, Take 40 mg by mouth daily before breakfast. , Disp: , Rfl: ;  furosemide (LASIX) 20 MG tablet, Take 20 mg by mouth 2 (two) times daily. , Disp: , Rfl:  mometasone (NASONEX) 50 MCG/ACT nasal spray, Place 2 sprays into the nose daily. For allergies, Disp: , Rfl: ;  montelukast (SINGULAIR) 10 MG tablet, Take 10 mg by mouth at bedtime. , Disp: , Rfl: ;  ramipril (ALTACE) 10 MG capsule, Take 10 mg by mouth daily. , Disp: , Rfl: ;  spironolactone (ALDACTONE) 12.5 mg  TABS, Take by mouth 2 (two) times daily. , Disp: , Rfl:    Review of Systems: - History obtained from the patient General ROS: negative Psychological ROS: negative Allergy and Immunology ROS: negative Hematological and Lymphatic ROS: negative Endocrine ROS: negative Breast ROS: negative for breast lumps Respiratory ROS: no cough, shortness of breath, or wheezing Cardiovascular ROS: no chest pain or dyspnea on exertion Gastrointestinal ROS: no abdominal pain, change in bowel habits, or black or bloody stools Genito-Urinary ROS: no dysuria, trouble voiding, or hematuria As per HPI otherwise Musculoskeletal ROS: negative Neurological ROS: negative  PHYSICAL EXAM:  There  were no vitals taken for this visit.  General: General appearance - alert, well appearing, and in no distress Chest - clear to auscultation, no wheezes, rales or rhonchi, symmetric air entry Heart - normal rate, regular rhythm, normal S1, S2, no murmurs, rubs, clicks or gallops Abdomen - soft, nontender, nondistended, no masses or organomegaly Breasts - breasts appear normal, no suspicious masses, no skin or nipple changes or axillary nodes Neurological - alert, oriented, normal speech, no focal findings or movement disorder noted Focused Gynecological Exam:   Normal external genitalia, vagina pink moist without discharge, cervix normal in appearance, uterus tender to midline palpation and soft consistent with adenomyosis, adnexa is normal sized and non tender.  Labs: Recent Results (from the past 168 hour(s))  TYPE AND SCREEN   Collection Time   10/13/10  1:00 PM      Component Value Range   ABO/RH(D) B POS     Antibody Screen NEG     Sample Expiration 10/27/2010    SURGICAL PCR SCREEN   Collection Time   10/13/10  1:14 PM      Component Value Range   MRSA, PCR NEGATIVE  NEGATIVE    Staphylococcus aureus NEGATIVE  NEGATIVE   CBC   Collection Time   10/13/10  1:15 PM      Component Value Range   WBC 7.0  4.0 - 10.5 (K/uL)   RBC 4.36  3.87 - 5.11 (MIL/uL)   Hemoglobin 13.0  12.0 - 15.0 (g/dL)   HCT 16.1  09.6 - 04.5 (%)   MCV 87.4  78.0 - 100.0 (fL)   MCH 29.8  26.0 - 34.0 (pg)   MCHC 34.1  30.0 - 36.0 (g/dL)   RDW 40.9  81.1 - 91.4 (%)   Platelets 264  150 - 400 (K/uL)  COMPREHENSIVE METABOLIC PANEL   Collection Time   10/13/10  1:15 PM      Component Value Range   Sodium 140  135 - 145 (mEq/L)   Potassium 3.9  3.5 - 5.1 (mEq/L)   Chloride 103  96 - 112 (mEq/L)   CO2 30  19 - 32 (mEq/L)   Glucose, Bld 85  70 - 99 (mg/dL)   BUN 13  6 - 23 (mg/dL)   Creatinine, Ser 7.82  0.50 - 1.10 (mg/dL)   Calcium 9.4  8.4 - 95.6 (mg/dL)   Total Protein 7.6  6.0 - 8.3 (g/dL)   Albumin 4.0   3.5 - 5.2 (g/dL)   AST 18  0 - 37 (U/L)   ALT 7  0 - 35 (U/L)   Alkaline Phosphatase 61  39 - 117 (U/L)   Total Bilirubin 0.2 (*) 0.3 - 1.2 (mg/dL)   GFR calc non Af Amer >60  >60 (mL/min)   GFR calc Af Amer >60  >60 (mL/min)  URINALYSIS, ROUTINE W REFLEX MICROSCOPIC   Collection Time  10/13/10  2:30 PM      Component Value Range   Color, Urine YELLOW  YELLOW    Appearance CLEAR  CLEAR    Specific Gravity, Urine 1.010  1.005 - 1.030    pH 7.0  5.0 - 8.0    Glucose, UA NEGATIVE  NEGATIVE (mg/dL)   Hgb urine dipstick SMALL (*) NEGATIVE    Bilirubin Urine NEGATIVE  NEGATIVE    Ketones NEGATIVE  NEGATIVE (mg/dL)   Protein NEGATIVE  NEGATIVE (mg/dL)   Urobilinogen, UA 0.2  0.0 - 1.0 (mg/dL)   Nitrite NEGATIVE  NEGATIVE    Leukocytes, UA NEGATIVE  NEGATIVE   URINE MICROSCOPIC-ADD ON   Collection Time   10/13/10  2:30 PM      Component Value Range   Squamous Epithelial / LPF RARE  RARE    WBC, UA 0-2  <3 (WBC/hpf)   RBC / HPF 3-6  <3 (RBC/hpf)    EKG: No orders found for this or any previous visit.  Imaging Studies: Patient had office ultrasound which revealed normal uterus with adenomyosis, normal sized ovaries, no pelvic abnormalities.    Assessment: 1.  Pelvic pain, unresponsive to conservative measures 2.  Menometrorrhagia 3.  Dysmenorrhea 4.  CHF, stable 5.  Hypertension,stable 6.  Asthma,stable   Plan: We will proceed with TVH due to patients nonresponse to conservative measures for her pain.  Pt understands the risks of surgery including but not limited t  excessive bleeding requiring transfusion or reoperation, post-operative infection requiring prolonged hospitalization or re-hospitalization and antibiotic therapy, and damage to other organs including bladder, bowel, ureters and major vessels. Patient did have a preoperative visit by her cardiologist in [place], [place].  [place]ow H

## 2010-10-19 ENCOUNTER — Ambulatory Visit (HOSPITAL_COMMUNITY)
Admission: RE | Admit: 2010-10-19 | Discharge: 2010-10-19 | Disposition: A | Discharge status: Home / Self Care | Payer: Medicaid Other | Source: Ambulatory Visit | Attending: Obstetrics & Gynecology | Admitting: Obstetrics & Gynecology

## 2010-10-19 ENCOUNTER — Encounter (HOSPITAL_COMMUNITY): Payer: Self-pay | Admitting: *Deleted

## 2010-10-19 ENCOUNTER — Other Ambulatory Visit: Payer: Self-pay | Admitting: Obstetrics & Gynecology

## 2010-10-19 ENCOUNTER — Encounter (HOSPITAL_COMMUNITY)
Admission: RE | Disposition: A | Discharge status: Home / Self Care | Payer: Self-pay | Source: Ambulatory Visit | Attending: Obstetrics & Gynecology

## 2010-10-19 ENCOUNTER — Ambulatory Visit (HOSPITAL_COMMUNITY): Payer: Medicaid Other | Admitting: Anesthesiology

## 2010-10-19 ENCOUNTER — Encounter (HOSPITAL_COMMUNITY): Payer: Self-pay | Admitting: Anesthesiology

## 2010-10-19 DIAGNOSIS — Z87898 Personal history of other specified conditions: Secondary | ICD-10-CM

## 2010-10-19 DIAGNOSIS — N920 Excessive and frequent menstruation with regular cycle: Secondary | ICD-10-CM | POA: Insufficient documentation

## 2010-10-19 DIAGNOSIS — Z01812 Encounter for preprocedural laboratory examination: Secondary | ICD-10-CM | POA: Insufficient documentation

## 2010-10-19 DIAGNOSIS — Z79899 Other long term (current) drug therapy: Secondary | ICD-10-CM | POA: Insufficient documentation

## 2010-10-19 DIAGNOSIS — R109 Unspecified abdominal pain: Secondary | ICD-10-CM | POA: Insufficient documentation

## 2010-10-19 DIAGNOSIS — N946 Dysmenorrhea, unspecified: Secondary | ICD-10-CM | POA: Insufficient documentation

## 2010-10-19 DIAGNOSIS — I10 Essential (primary) hypertension: Secondary | ICD-10-CM | POA: Insufficient documentation

## 2010-10-19 HISTORY — PX: VAGINAL HYSTERECTOMY: SHX2639

## 2010-10-19 SURGERY — HYSTERECTOMY, VAGINAL
Anesthesia: Spinal | Wound class: Clean Contaminated

## 2010-10-19 MED ORDER — PROPOFOL 10 MG/ML IV EMUL
INTRAVENOUS | Status: AC
  Filled 2010-10-19: qty 20

## 2010-10-19 MED ORDER — KETOROLAC TROMETHAMINE 30 MG/ML IJ SOLN
30.0000 mg | Freq: Once | INTRAMUSCULAR | Status: AC
  Administered 2010-10-19: 30 mg via INTRAVENOUS

## 2010-10-19 MED ORDER — OXYCODONE-ACETAMINOPHEN 5-325 MG PO TABS: 1.0000 | ORAL_TABLET | ORAL | Status: AC | PRN

## 2010-10-19 MED ORDER — FENTANYL CITRATE 0.05 MG/ML IJ SOLN
INTRAMUSCULAR | Status: DC | PRN
  Administered 2010-10-19: 20 ug via INTRATHECAL

## 2010-10-19 MED ORDER — CEFAZOLIN SODIUM 1-5 GM-% IV SOLN: 1.0000 g | INTRAVENOUS | Status: DC

## 2010-10-19 MED ORDER — HYDRALAZINE HCL 20 MG/ML IJ SOLN
5.0000 mg | INTRAMUSCULAR | Status: DC | PRN
  Administered 2010-10-19: 5 mg via INTRAVENOUS

## 2010-10-19 MED ORDER — GLYCOPYRROLATE 0.2 MG/ML IJ SOLN
0.2000 mg | Freq: Once | INTRAMUSCULAR | Status: AC
  Administered 2010-10-19: 0.2 mg via INTRAVENOUS

## 2010-10-19 MED ORDER — CEFAZOLIN SODIUM 1-5 GM-% IV SOLN
INTRAVENOUS | Status: DC | PRN
  Administered 2010-10-19: 1 g via INTRAVENOUS

## 2010-10-19 MED ORDER — CEFAZOLIN SODIUM 1-5 GM-% IV SOLN
INTRAVENOUS | Status: AC
  Filled 2010-10-19: qty 50

## 2010-10-19 MED ORDER — MIDAZOLAM HCL 2 MG/2ML IJ SOLN
INTRAMUSCULAR | Status: AC
  Administered 2010-10-19: 2 mg via INTRAVENOUS
  Filled 2010-10-19: qty 2

## 2010-10-19 MED ORDER — LACTATED RINGERS IV SOLN
INTRAVENOUS | Status: DC
  Administered 2010-10-19: 200 mL via INTRAVENOUS
  Filled 2010-10-19: qty 1000

## 2010-10-19 MED ORDER — MIDAZOLAM HCL 5 MG/ML IJ SOLN: INTRAMUSCULAR | Status: DC | PRN

## 2010-10-19 MED ORDER — KETOROLAC TROMETHAMINE 30 MG/ML IJ SOLN
INTRAMUSCULAR | Status: AC
  Administered 2010-10-19: 30 mg via INTRAVENOUS
  Filled 2010-10-19: qty 1

## 2010-10-19 MED ORDER — BUPIVACAINE HCL 0.75 % IJ SOLN
INTRAMUSCULAR | Status: DC | PRN
  Administered 2010-10-19: 1.6 mL via INTRATHECAL

## 2010-10-19 MED ORDER — LACTATED RINGERS IV SOLN
INTRAVENOUS | Status: DC
  Administered 2010-10-19: 12:00:00 via INTRAVENOUS

## 2010-10-19 MED ORDER — GLYCOPYRROLATE 0.2 MG/ML IJ SOLN
INTRAMUSCULAR | Status: AC
  Administered 2010-10-19: 0.2 mg via INTRAVENOUS
  Filled 2010-10-19: qty 1

## 2010-10-19 MED ORDER — FENTANYL CITRATE 0.05 MG/ML IJ SOLN: 25.0000 ug | INTRAMUSCULAR | Status: DC | PRN

## 2010-10-19 MED ORDER — FENTANYL CITRATE 0.05 MG/ML IJ SOLN
INTRAMUSCULAR | Status: DC | PRN
  Administered 2010-10-19: 50 ug via INTRAVENOUS
  Administered 2010-10-19: 30 ug via INTRAVENOUS

## 2010-10-19 MED ORDER — LIDOCAINE HCL (PF) 1 % IJ SOLN
INTRAMUSCULAR | Status: AC
  Filled 2010-10-19: qty 5

## 2010-10-19 MED ORDER — SODIUM CHLORIDE 0.9 % IV SOLN
INTRAVENOUS | Status: DC
  Filled 2010-10-19: qty 1000

## 2010-10-19 MED ORDER — BUPIVACAINE-EPINEPHRINE PF 0.5-1:200000 % IJ SOLN
INTRAMUSCULAR | Status: AC
  Filled 2010-10-19: qty 10

## 2010-10-19 MED ORDER — CIPROFLOXACIN HCL 500 MG PO TABS: 500.0000 mg | ORAL_TABLET | Freq: Two times a day (BID) | ORAL | Status: AC

## 2010-10-19 MED ORDER — IBUPROFEN 800 MG PO TABS: 800.0000 mg | ORAL_TABLET | Freq: Three times a day (TID) | ORAL | Status: AC | PRN

## 2010-10-19 MED ORDER — ONDANSETRON HCL 4 MG/2ML IJ SOLN
4.0000 mg | Freq: Once | INTRAMUSCULAR | Status: DC | PRN
Start: 2010-10-19 — End: 2010-10-19

## 2010-10-19 MED ORDER — HYDRALAZINE HCL 20 MG/ML IJ SOLN
INTRAMUSCULAR | Status: AC
  Administered 2010-10-19: 5 mg via INTRAVENOUS
  Filled 2010-10-19: qty 1

## 2010-10-19 MED ORDER — PROPOFOL 10 MG/ML IV EMUL
INTRAVENOUS | Status: DC | PRN
  Administered 2010-10-19: 50 ug/kg/min via INTRAVENOUS

## 2010-10-19 MED ORDER — MIDAZOLAM HCL 2 MG/2ML IJ SOLN
1.0000 mg | INTRAMUSCULAR | Status: DC | PRN
  Administered 2010-10-19: 2 mg via INTRAVENOUS

## 2010-10-19 MED ORDER — MIDAZOLAM HCL 2 MG/2ML IJ SOLN
INTRAMUSCULAR | Status: AC
  Filled 2010-10-19: qty 2

## 2010-10-19 MED ORDER — BUPIVACAINE-EPINEPHRINE 0.5% -1:200000 IJ SOLN
INTRAMUSCULAR | Status: DC | PRN
  Administered 2010-10-19: 8 mL

## 2010-10-19 MED ORDER — FENTANYL CITRATE 0.05 MG/ML IJ SOLN
INTRAMUSCULAR | Status: AC
  Filled 2010-10-19: qty 2

## 2010-10-19 MED ORDER — MIDAZOLAM HCL 5 MG/5ML IJ SOLN
INTRAMUSCULAR | Status: DC | PRN
  Administered 2010-10-19: 2 mg via INTRAVENOUS
  Administered 2010-10-19 (×2): 1 mg via INTRAVENOUS

## 2010-10-19 SURGICAL SUPPLY — 40 items
BAG HAMPER (MISCELLANEOUS) ×2 IMPLANT
CLOTH BEACON ORANGE TIMEOUT ST (SAFETY) ×2 IMPLANT
COVER LIGHT HANDLE STERIS (MISCELLANEOUS) ×4 IMPLANT
DECANTER SPIKE VIAL GLASS SM (MISCELLANEOUS) ×2 IMPLANT
DRAPE PROXIMA HALF (DRAPES) ×2 IMPLANT
DRAPE STERI URO 9X17 APER PCH (DRAPES) ×2 IMPLANT
ELECT CAUTERY BLADE 6.4 (BLADE) ×2 IMPLANT
ELECT REM PT RETURN 9FT ADLT (ELECTROSURGICAL) ×2
ELECTRODE REM PT RTRN 9FT ADLT (ELECTROSURGICAL) ×1 IMPLANT
FORMALIN 10 PREFIL 480ML (MISCELLANEOUS) ×2 IMPLANT
GAUZE PACKING 2X5 YD STERILE (GAUZE/BANDAGES/DRESSINGS) ×2 IMPLANT
GLOVE BIOGEL PI IND STRL 8 (GLOVE) ×1 IMPLANT
GLOVE BIOGEL PI INDICATOR 8 (GLOVE) ×1
GLOVE ECLIPSE 6.5 STRL STRAW (GLOVE) ×4 IMPLANT
GLOVE ECLIPSE 7.0 STRL STRAW (GLOVE) ×2 IMPLANT
GLOVE ECLIPSE 8.0 STRL XLNG CF (GLOVE) ×2 IMPLANT
GLOVE INDICATOR 7.0 STRL GRN (GLOVE) ×4 IMPLANT
GLOVE INDICATOR 7.5 STRL GRN (GLOVE) ×2 IMPLANT
GOWN BRE IMP SLV AUR XL STRL (GOWN DISPOSABLE) ×4 IMPLANT
GOWN STRL REIN XL XLG (GOWN DISPOSABLE) ×4 IMPLANT
GOWN SURGICAL X LG (GOWNS) ×4 IMPLANT
KIT ROOM TURNOVER AP CYSTO (KITS) ×2 IMPLANT
MANIFOLD NEPTUNE II (INSTRUMENTS) ×2 IMPLANT
NEEDLE HYPO 22GX1.5 SAFETY (NEEDLE) ×2 IMPLANT
NS IRRIG 1000ML POUR BTL (IV SOLUTION) ×2 IMPLANT
PACK PERI GYN (CUSTOM PROCEDURE TRAY) ×2 IMPLANT
PAD ARMBOARD 7.5X6 YLW CONV (MISCELLANEOUS) ×2 IMPLANT
RETRACTOR LONRSTAR 16.6X16.6CM (MISCELLANEOUS) ×1 IMPLANT
RETRACTOR STAY HOOK 5MM (MISCELLANEOUS) ×2 IMPLANT
RETRACTOR STER APS 16.6X16.6CM (MISCELLANEOUS) ×2
SET BASIN LINEN APH (SET/KITS/TRAYS/PACK) ×2 IMPLANT
SET IV ADMIN VERSALIGHT (MISCELLANEOUS) ×2 IMPLANT
SUT MNCRL+ AB 3-0 CT1 36 (SUTURE) ×1 IMPLANT
SUT MONOCRYL AB 3-0 CT1 36IN (SUTURE) ×1
SUT VIC AB 0 CT1 27 (SUTURE) ×3
SUT VIC AB 0 CT1 27XBRD ANTBC (SUTURE) ×1 IMPLANT
SUT VIC AB 0 CT1 27XCR 8 STRN (SUTURE) ×2 IMPLANT
SYR CONTROL 10ML LL (SYRINGE) ×2 IMPLANT
TRAY FOLEY CATH 14FR (SET/KITS/TRAYS/PACK) ×2 IMPLANT
VERSALIGHT (MISCELLANEOUS) ×2 IMPLANT

## 2010-10-19 NOTE — Anesthesia Procedure Notes (Addendum)
Spinal Block  Patient location during procedure: OR Start time: 10/19/2010 12:54 PM Staffing Performed by: resident/CRNA  Preanesthetic Checklist Completed: patient identified, site marked, surgical consent, pre-op evaluation, timeout performed, IV checked, risks and benefits discussed and monitors and equipment checked Spinal Block Patient position: right lateral decubitus Prep: Betadine Patient monitoring: cardiac monitor, continuous pulse ox and blood pressure Approach: midline Location: L3-4 Injection technique: single-shot Needle Needle type: Pencan  Needle gauge: 24 G Needle length: 10 cm Assessment Sensory level: T8 Additional Notes Lot # [id number], Expiration date 04/2011. Spinal -marcaine .75%, 1.6cc with fentanyl at 12:54

## 2010-10-19 NOTE — Transfer of Care (Signed)
Immediate Anesthesia Transfer of Care Note  Patient: Destiny Manning  Procedure(s) Performed:  HYSTERECTOMY VAGINAL  Patient Location: PACU  Anesthesia Type: Spinal  Level of Consciousness: awake, alert  and oriented  Airway & Oxygen Therapy: Patient Spontanous Breathing  Post-op Assessment: Report given to PACU RN and Post -op Vital signs reviewed and stable  Post vital signs: stable  Complications: No apparent anesthesia complications

## 2010-10-19 NOTE — Op Note (Signed)
Preoperative diagnosis:  1.  Midline pelvic pain,  unresponsive to conservative measures                                         2.  Adenomyosis                                         3.  Menometrorrhagia, responsive to high-dose megestrol                                         4.  Dysmenorrhea, severe, resolved with amenorrhea  Postoperative diagnosis:  Same as above  Procedure:  Vaginal hysterectomy  Surgeon:  Lazaro Arms MD  Anesthesia:  Spinal  Findings:  The patient has been experiencing heavy prolonged vaginal bleeding since April.   Historically she has not had a great deal of problems with her period whoever over the past 6 months or so she's had a significant increase in her pelvic and abdominal pain.  As a result I placed her on high-dose megestrol which significantly improved her instrument bleeding and resultant dysmenorrhea; however her midline chronic pelvic pain has continued.  Additionally continuing in high-dose Megace could be detrimental to her cardiomyopathy and congestive heart.  As a result we are proceeding with a vaginal hysterectomy.  Intraoperative findings were essentially negative.  Both ovaries were normal size no masses.  The uterus was normal size soft consistent with adenomyosis which was found in the office on ultrasound.  Description of operation:  The patient was taken from the preoperative area to the operating room in stable condition. She was placed in the sitting position and underwent a spinal anesthetic. Once an adequate level of anesthesia was attained she was placed in the dorsal lithotomy position. Patient was prepped and draped in the usual sterile fashion and a Foley catheter was placed.  A weighted speculum was placed and the cervix was grasped with thyroid tenaculums both anteriorly and posteriorly.  0.5% Marcaine plain was injected in a circumferential fashion about the cervix and the electrocautery unit was used to incise the vagina and push at  all cervix.  The posterior cul-de-sac was then entered sharply without difficulty.  The uterosacral ligaments were clamped cut and inspection suture ligated and held.  The cardinal ligaments were then clamped cut transfixion suture ligated and cut. The anterior peritoneum was identified the anterior cul-de-sac was entered sharply without difficulty. The anterior and posterior leaves of the broad ligament were plicated and the uterine vessels were clamped cut and suture ligated. Serial pedicles were taken of the fundus with each pedicle being clamped cut and suture ligated. The utero-ovarian ligaments were crossclamped the uterus was removed and both pedicles were transfixion suture ligated. There was good hemostasis of all the pedicles. The peritoneum was then closed in a pursestring fashion using 3-0 Vicryl. The anterior posterior vagina was closed in interrupted fashion with good resultant hemostasis. Our closure the lower pelvis and vagina were irrigated vigorously.  The sponge needle and instrument counts were correct x 3.  The patient received 1 g of Ancef and 30 mg of Toradol IV preoperatively prophylactically.  She was taken to the recovery room in good stable  condition awake alert doing well.

## 2010-10-19 NOTE — Anesthesia Postprocedure Evaluation (Signed)
  Anesthesia Post-op Note  Patient: Destiny Manning  Procedure(s) Performed:  HYSTERECTOMY VAGINAL  Patient Location: PACU  Anesthesia Type: Spinal  Level of Consciousness: awake  Airway and Oxygen Therapy: Patient Spontanous Breathing  Post-op Pain: none  Post-op Assessment: Patient's Cardiovascular Status Stable, Respiratory Function Stable, Patent Airway and No signs of Nausea or vomiting  Post-op Vital Signs: stable  Complications: No apparent anesthesia complications

## 2010-10-19 NOTE — Anesthesia Preprocedure Evaluation (Addendum)
Anesthesia Evaluation  Name, MR# and DOB Patient awake  General Assessment Comment  Reviewed: Allergy & Precautions, H&P , Patient's Chart, lab work & pertinent test results and reviewed documented beta blocker date and time   History of Anesthesia Complications Negative for: history of anesthetic complications  Airway Mallampati: I TM Distance: >3 FB Neck ROM: Full    Dental  (+) Teeth Intact   Pulmonary  asthma (inhaler today)    pulmonary exam normal   Cardiovascular hypertension (coreg today 0530), Pt. on home beta blockers and Pt. on medications +CHF (cardiomyopathy EF 40%  poor exercise tolerance) Regular Normal   Neuro/Psych (+) {AN ROS/MED HX NEURO HEADACHES (+) Depression,   GI/Hepatic/Renal (+)  GERD Medicated and Controlled     Endo/Other   Abdominal   Musculoskeletal  Hematology   Peds  Reproductive/Obstetrics   Anesthesia Other Findings             Anesthesia Physical Anesthesia Plan  ASA: III  Anesthesia Plan: Spinal   Post-op Pain Management:    Induction:   Airway Management Planned: Nasal Cannula  Additional Equipment:   Intra-op Plan:   Post-operative Plan:   Informed Consent: I have reviewed the patients History and Physical, chart, labs and discussed the procedure including the risks, benefits and alternatives for the proposed anesthesia with the patient or authorized representative who has indicated his/her understanding and acceptance.     Plan Discussed with:   Anesthesia Plan Comments:         Anesthesia Quick Evaluation

## 2010-10-26 LAB — TYPE AND SCREEN
ABO/RH(D): B POS
Antibody Screen: NEGATIVE

## 2010-10-31 ENCOUNTER — Encounter (HOSPITAL_COMMUNITY): Payer: Self-pay | Admitting: Obstetrics & Gynecology

## 2010-12-29 LAB — CBC
HCT: 36.1
Hemoglobin: 11.8 — ABNORMAL LOW
MCHC: 32.6
RDW: 14.9

## 2010-12-29 LAB — COMPREHENSIVE METABOLIC PANEL
BUN: 9
Calcium: 8.5
Creatinine, Ser: 1.05
Glucose, Bld: 81
Total Protein: 6.4

## 2010-12-29 LAB — HCG, QUANTITATIVE, PREGNANCY: hCG, Beta Chain, Quant, S: <2

## 2011-01-16 LAB — COMPREHENSIVE METABOLIC PANEL
ALT: 18
Albumin: 2.9 — ABNORMAL LOW
Alkaline Phosphatase: 83
BUN: 10
Chloride: 106
Glucose, Bld: 101 — ABNORMAL HIGH
Potassium: 3.7
Total Bilirubin: 0.4

## 2011-01-16 LAB — DIFFERENTIAL
Basophils Absolute: 0
Basophils Relative: 0
Eosinophils Absolute: 0.1 — ABNORMAL LOW
Monocytes Absolute: 1
Neutro Abs: 9.5 — ABNORMAL HIGH

## 2011-01-16 LAB — CBC
HCT: 28.5 — ABNORMAL LOW
Hemoglobin: 9.6 — ABNORMAL LOW
WBC: 12.2 — ABNORMAL HIGH

## 2011-01-17 LAB — CBC
HCT: 16.7 — ABNORMAL LOW
Hemoglobin: 5.8 — CL
MCHC: 35.1
MCV: 89.5
MCV: 91.4
Platelets: 207
Platelets: 281
Platelets: 288
RBC: 1.82 — ABNORMAL LOW
RBC: 3.41 — ABNORMAL LOW
RBC: 3.6 — ABNORMAL LOW
RDW: 14.9
WBC: 13.3 — ABNORMAL HIGH
WBC: 18.4 — ABNORMAL HIGH

## 2011-01-17 LAB — HEMOGLOBIN: Hemoglobin: 8 — ABNORMAL LOW

## 2011-01-17 LAB — CROSSMATCH

## 2011-01-17 LAB — RPR: RPR Ser Ql: NONREACTIVE

## 2011-01-17 LAB — ABO/RH: ABO/RH(D): B POS

## 2011-01-17 LAB — HEMATOCRIT: HCT: 22.7 — ABNORMAL LOW

## 2011-01-19 LAB — URINALYSIS, ROUTINE W REFLEX MICROSCOPIC
Ketones, ur: 15 — AB
Leukocytes, UA: NEGATIVE
Nitrite: NEGATIVE
Protein, ur: NEGATIVE
pH: 6.5

## 2011-01-19 LAB — COMPREHENSIVE METABOLIC PANEL WITH GFR
ALT: 16
AST: 31
Albumin: 2.9 — ABNORMAL LOW
Alkaline Phosphatase: 80
BUN: 8
CO2: 22
Calcium: 8.9
Chloride: 104
Creatinine, Ser: 0.54
GFR calc Af Amer: >60
GFR calc non Af Amer: >60
Glucose, Bld: 88
Potassium: 4
Sodium: 134 — ABNORMAL LOW
Total Bilirubin: 0.6
Total Protein: 6.2

## 2011-01-19 LAB — URINE MICROSCOPIC-ADD ON

## 2011-01-19 LAB — CBC
HCT: 31.3 — ABNORMAL LOW
Hemoglobin: 10.9 — ABNORMAL LOW
MCHC: 34.8
MCV: 90.4
Platelets: 336
RBC: 3.47 — ABNORMAL LOW
RDW: 14.7 — ABNORMAL HIGH
WBC: 10.7 — ABNORMAL HIGH

## 2011-01-19 LAB — LACTATE DEHYDROGENASE: LDH: 118

## 2011-01-19 LAB — URIC ACID: Uric Acid, Serum: 3.8

## 2011-01-20 LAB — COMPREHENSIVE METABOLIC PANEL
ALT: 10
Alkaline Phosphatase: 57
CO2: 26
Glucose, Bld: 96
Potassium: 4
Sodium: 135
Total Protein: 6.1

## 2011-01-20 LAB — URIC ACID: Uric Acid, Serum: 2.5

## 2011-01-20 LAB — CBC
Hemoglobin: 10.9 — ABNORMAL LOW
RBC: 3.49 — ABNORMAL LOW
RDW: 14.3 — ABNORMAL HIGH
WBC: 13.3 — ABNORMAL HIGH

## 2011-01-20 LAB — LACTATE DEHYDROGENASE: LDH: 100

## 2011-01-26 LAB — DIFFERENTIAL
Lymphocytes Relative: 31
Lymphs Abs: 3.1
Monocytes Relative: 9
Neutro Abs: 6.1
Neutrophils Relative %: 59

## 2011-01-26 LAB — URINALYSIS, ROUTINE W REFLEX MICROSCOPIC
Glucose, UA: NEGATIVE
Specific Gravity, Urine: 1.01

## 2011-01-26 LAB — COMPREHENSIVE METABOLIC PANEL
ALT: 14
BUN: 6
Calcium: 9.2
Creatinine, Ser: 0.59
Glucose, Bld: 95
Sodium: 133 — ABNORMAL LOW
Total Protein: 6.1

## 2011-01-26 LAB — CBC
Hemoglobin: 11.6 — ABNORMAL LOW
MCHC: 35.4
RDW: 14.8 — ABNORMAL HIGH

## 2011-01-26 LAB — URINE MICROSCOPIC-ADD ON

## 2011-03-15 ENCOUNTER — Emergency Department (HOSPITAL_COMMUNITY)
Admission: EM | Admit: 2011-03-15 | Discharge: 2011-03-15 | Disposition: A | Discharge status: Home / Self Care | Payer: Medicaid Other | Attending: Emergency Medicine | Admitting: Emergency Medicine

## 2011-03-15 ENCOUNTER — Emergency Department (HOSPITAL_COMMUNITY): Payer: Medicaid Other

## 2011-03-15 ENCOUNTER — Encounter (HOSPITAL_COMMUNITY): Payer: Self-pay | Admitting: *Deleted

## 2011-03-15 DIAGNOSIS — J301 Allergic rhinitis due to pollen: Secondary | ICD-10-CM | POA: Insufficient documentation

## 2011-03-15 DIAGNOSIS — B349 Viral infection, unspecified: Secondary | ICD-10-CM

## 2011-03-15 DIAGNOSIS — B9789 Other viral agents as the cause of diseases classified elsewhere: Secondary | ICD-10-CM | POA: Insufficient documentation

## 2011-03-15 DIAGNOSIS — I10 Essential (primary) hypertension: Secondary | ICD-10-CM | POA: Insufficient documentation

## 2011-03-15 DIAGNOSIS — I428 Other cardiomyopathies: Secondary | ICD-10-CM | POA: Insufficient documentation

## 2011-03-15 DIAGNOSIS — K219 Gastro-esophageal reflux disease without esophagitis: Secondary | ICD-10-CM | POA: Insufficient documentation

## 2011-03-15 DIAGNOSIS — Z9079 Acquired absence of other genital organ(s): Secondary | ICD-10-CM | POA: Insufficient documentation

## 2011-03-15 DIAGNOSIS — J45909 Unspecified asthma, uncomplicated: Secondary | ICD-10-CM | POA: Insufficient documentation

## 2011-03-15 DIAGNOSIS — I509 Heart failure, unspecified: Secondary | ICD-10-CM | POA: Insufficient documentation

## 2011-03-15 MED ORDER — ACETAMINOPHEN 500 MG PO TABS
1000.0000 mg | ORAL_TABLET | Freq: Once | ORAL | Status: AC
  Administered 2011-03-15: 1000 mg via ORAL
  Filled 2011-03-15: qty 2

## 2011-03-15 MED ORDER — ALBUTEROL SULFATE HFA 108 (90 BASE) MCG/ACT IN AERS
2.0000 | INHALATION_SPRAY | RESPIRATORY_TRACT | Status: AC
  Administered 2011-03-15: 2 via RESPIRATORY_TRACT
  Filled 2011-03-15: qty 6.7

## 2011-03-15 MED ORDER — IBUPROFEN 400 MG PO TABS
400.0000 mg | ORAL_TABLET | Freq: Once | ORAL | Status: AC
  Administered 2011-03-15: 400 mg via ORAL
  Filled 2011-03-15: qty 1

## 2011-03-15 MED ORDER — ALBUTEROL SULFATE HFA 108 (90 BASE) MCG/ACT IN AERS
INHALATION_SPRAY | RESPIRATORY_TRACT | Status: AC
  Administered 2011-03-15: 21:00:00
  Filled 2011-03-15: qty 6.7

## 2011-03-15 NOTE — ED Notes (Signed)
Patient transported to X-ray 

## 2011-03-15 NOTE — ED Provider Notes (Signed)
History     CSN: [id number] Arrival date & time: 03/15/2011  7:17 PM    Chief Complaint  Patient presents with  . Generalized Body Aches  . Chills  . Cough   HPI Pt was seen at 1945.  Per pt, c/o gradual onset and persistence of constant generalized body aches/fatigue, chills, sore throat, cough with post tussive emesis, nausea, and runny/stuffy nose x2 days.  +others in household with same symptoms.  Denies rash, no diarrhea, no abd pain, no CP/SOB.     Past Medical History  Diagnosis Date  . GERD (gastroesophageal reflux disease)     takes Nexium Daily  . Hypertension   . CHF (congestive heart failure) 03/19/08    EF 35-40%  . Asthma dx 06/04/07    ventolin, singulair  . DCM (dilated cardiomyopathy) dx 06/06/07    EF 10-15%  . Seasonal allergies     takes zyrtec    Past Surgical History  Procedure Date  . Cholecystectomy 2001    laparoscopic, [facility]  . Cervical spine surgery 2002    [facility], ruptured disc  . Vaginal hysterectomy 10/19/2010    Procedure: HYSTERECTOMY VAGINAL;  Surgeon: Lazaro Arms, MD;  Location: [facility];  Service: Gynecology;  Laterality: N/A;    Family History  Problem Relation Age of Onset  . Anesthesia problems Neg Hx   . Hypotension Neg Hx   . Pseudochol deficiency Neg Hx   . Malignant hyperthermia Neg Hx     History  Substance Use Topics  . Smoking status: Former Smoker    Quit date: 03/14/2009  . Smokeless tobacco: Never Used  . Alcohol Use: No    Review of Systems ROS: Statement: All systems negative except as marked or noted in the HPI; Constitutional: Negative for fever. +generalized body aches/fatigue, chills ; ; Eyes: Negative for eye pain, redness and discharge. ; ; ENMT: Negative for ear pain, hoarseness, +rhinorrhea, nasal congestion, sinus pressure and sore throat. ; ; Cardiovascular: Negative for chest pain, palpitations, diaphoresis, dyspnea and peripheral edema. ; ; Respiratory: +cough with post-tussive emesis.  Negative for  wheezing and stridor. ; ; Gastrointestinal: +nausea.  Negative for diarrhea, abdominal pain, blood in stool, hematemesis, jaundice and rectal bleeding. . ; ; Genitourinary: Negative for dysuria, flank pain and hematuria. ; ; Musculoskeletal: Negative for back pain and neck pain. Negative for swelling and trauma.; ; Skin: Negative for pruritus, rash, abrasions, blisters, bruising and skin lesion.; ; Neuro: Negative for headache, lightheadedness and neck stiffness. Negative for weakness, altered level of consciousness , altered mental status, extremity weakness, paresthesias, involuntary movement, seizure and syncope.     Allergies  Review of patient's allergies indicates no known allergies.  Home Medications   Current Outpatient Rx  Name Route Sig Dispense Refill  . BUSPIRONE HCL 15 MG PO TABS Oral Take 30 mg by mouth daily.      Marland Kitchen CARVEDILOL 25 MG PO TABS Oral Take 25 mg by mouth 2 (two) times daily with a meal.     . CITALOPRAM HYDROBROMIDE 40 MG PO TABS Oral Take 40 mg by mouth at bedtime.      Marland Kitchen DEXTROMETHORPHAN-GUAIFENESIN 10-100 MG/5ML PO LIQD Oral Take 10 mLs by mouth every 4 (four) hours as needed. For cough     . DIGOXIN 0.125 MG PO TABS Oral Take 125 mcg by mouth daily.     Marland Kitchen ESOMEPRAZOLE MAGNESIUM 40 MG PO CPDR Oral Take 40 mg by mouth daily before breakfast.     .  FLUTICASONE PROPIONATE 50 MCG/ACT NA SUSP Nasal Place 1 spray into the nose daily.      . FUROSEMIDE 20 MG PO TABS Oral Take 20 mg by mouth 2 (two) times daily.     Marland Kitchen RAMIPRIL 10 MG PO CAPS Oral Take 10 mg by mouth daily.     Marland Kitchen SPIRONOLACTONE 12.5 MG HALF TABLET Oral Take 12.5 mg by mouth 2 (two) times daily.       BP 151/90  Pulse 75  Temp(Src) 98.9 F (37.2 C) (Oral)  Resp 18  Ht 5\' 3"  (1.6 m)  Wt 153 lb (69.4 kg)  BMI 27.10 kg/m2  SpO2 96%  LMP 07/04/2010  Physical Exam 1950: Physical examination:  Nursing notes reviewed; Vital signs and O2 SAT reviewed;  Constitutional: Well developed, Well nourished, Well  hydrated, In no acute distress; Head:  Normocephalic, atraumatic; Eyes: EOMI, PERRL, No scleral icterus; ENMT: +clear fluid levels behind TM's bilat.  +edemetous nasal turbinates bilat with clear rhinorrhea. Mouth and pharynx normal, Mucous membranes moist; Neck: Supple, Full range of motion, No lymphadenopathy; Cardiovascular: Regular rate and rhythm, No murmur, rub, or gallop; Respiratory: Breath sounds clear & equal bilaterally, No rales, rhonchi, wheezes, or rub, Speaking full sentences with ease. Normal respiratory effort/excursion; Chest: Nontender, Movement normal; Abdomen: Soft, Nontender, Nondistended, Normal bowel sounds; Extremities: Pulses normal, No tenderness, No edema, No calf edema or asymmetry.; Neuro: AA&Ox3, Major CN grossly intact.  No gross focal motor or sensory deficits in extremities.; Skin: Color normal, Warm, Dry, no rash, no petechiae.    ED Course  Procedures   MDM  MDM Reviewed: nursing note and vitals Interpretation: x-ray   Dg Chest 2 View  03/15/2011  *RADIOLOGY REPORT*  Clinical Data: Nausea, vomiting and cough for 2 days.  CHEST - 2 VIEW  Comparison: 02/04/2009 radiographs.  Findings: The heart size and mediastinal contours are normal. The lungs are clear. There is no pleural effusion or pneumothorax. No acute osseous findings are identified.  There is a mild scoliosis which may be positional.  Postsurgical changes are noted status post lower cervical fusion.  IMPRESSION: No active cardiopulmonary process.  Original Report Authenticated By: Gerrianne Scale, M.D.      8:45 PM:  No CHF or pneumonia on CXR.  VSS.  Appears viral syndrome/URI at this time; will tx symptomatically.  Dx testing d/w pt.  Questions answered.  Verb understanding, agreeable to d/c home with outpt f/u.     Idalee Foxworthy Allison Quarry, DO 03/16/11 1350

## 2011-03-15 NOTE — ED Notes (Signed)
C/o cough, congestion, body aches, n/v x 2 days

## 2011-03-15 NOTE — ED Notes (Signed)
Pt back from x-ray.

## 2011-03-15 NOTE — ED Notes (Signed)
Pt reports having non productive cough, body aches, fever for 2 days. Pt reports fatigue, and reports nausea and vomiting. Reports pain 10/10 generalized body pain.

## 2011-03-15 NOTE — ED Notes (Signed)
Pt given discharge instructions, paperwork, pt verbalized understanding.   

## 2011-09-28 ENCOUNTER — Encounter: Payer: Self-pay | Admitting: Nurse Practitioner

## 2011-10-09 ENCOUNTER — Encounter: Payer: Self-pay | Admitting: Nurse Practitioner

## 2012-06-21 ENCOUNTER — Encounter: Payer: Self-pay | Admitting: Gastroenterology

## 2012-07-08 ENCOUNTER — Ambulatory Visit: Payer: Medicaid Other | Admitting: Gastroenterology

## 2012-07-11 ENCOUNTER — Ambulatory Visit (INDEPENDENT_AMBULATORY_CARE_PROVIDER_SITE_OTHER): Payer: Medicaid Other | Admitting: Gastroenterology

## 2012-07-11 ENCOUNTER — Encounter (HOSPITAL_COMMUNITY): Payer: Self-pay

## 2012-07-11 ENCOUNTER — Emergency Department (HOSPITAL_COMMUNITY)
Admission: EM | Admit: 2012-07-11 | Discharge: 2012-07-12 | Disposition: A | Discharge status: Home / Self Care | Payer: Medicaid Other | Attending: Emergency Medicine | Admitting: Emergency Medicine

## 2012-07-11 ENCOUNTER — Encounter: Payer: Self-pay | Admitting: Gastroenterology

## 2012-07-11 VITALS — BP 140/88 | HR 86 | Temp 97.5°F | Ht 63.0 in | Wt 159.4 lb

## 2012-07-11 DIAGNOSIS — R1084 Generalized abdominal pain: Secondary | ICD-10-CM | POA: Insufficient documentation

## 2012-07-11 DIAGNOSIS — I10 Essential (primary) hypertension: Secondary | ICD-10-CM | POA: Insufficient documentation

## 2012-07-11 DIAGNOSIS — K219 Gastro-esophageal reflux disease without esophagitis: Secondary | ICD-10-CM | POA: Insufficient documentation

## 2012-07-11 DIAGNOSIS — R109 Unspecified abdominal pain: Secondary | ICD-10-CM

## 2012-07-11 DIAGNOSIS — K5900 Constipation, unspecified: Secondary | ICD-10-CM | POA: Insufficient documentation

## 2012-07-11 DIAGNOSIS — R1012 Left upper quadrant pain: Secondary | ICD-10-CM

## 2012-07-11 DIAGNOSIS — Z87891 Personal history of nicotine dependence: Secondary | ICD-10-CM | POA: Insufficient documentation

## 2012-07-11 DIAGNOSIS — J45909 Unspecified asthma, uncomplicated: Secondary | ICD-10-CM | POA: Insufficient documentation

## 2012-07-11 DIAGNOSIS — I509 Heart failure, unspecified: Secondary | ICD-10-CM | POA: Insufficient documentation

## 2012-07-11 DIAGNOSIS — I428 Other cardiomyopathies: Secondary | ICD-10-CM | POA: Insufficient documentation

## 2012-07-11 DIAGNOSIS — R110 Nausea: Secondary | ICD-10-CM

## 2012-07-11 DIAGNOSIS — Z9089 Acquired absence of other organs: Secondary | ICD-10-CM | POA: Insufficient documentation

## 2012-07-11 DIAGNOSIS — Z79899 Other long term (current) drug therapy: Secondary | ICD-10-CM | POA: Insufficient documentation

## 2012-07-11 DIAGNOSIS — Z90710 Acquired absence of both cervix and uterus: Secondary | ICD-10-CM | POA: Insufficient documentation

## 2012-07-11 DIAGNOSIS — IMO0002 Reserved for concepts with insufficient information to code with codable children: Secondary | ICD-10-CM | POA: Insufficient documentation

## 2012-07-11 DIAGNOSIS — E876 Hypokalemia: Secondary | ICD-10-CM

## 2012-07-11 DIAGNOSIS — G8929 Other chronic pain: Secondary | ICD-10-CM | POA: Insufficient documentation

## 2012-07-11 DIAGNOSIS — Z9851 Tubal ligation status: Secondary | ICD-10-CM | POA: Insufficient documentation

## 2012-07-11 LAB — COMPREHENSIVE METABOLIC PANEL
ALT: 8 U/L (ref 0–35)
AST: 20 U/L (ref 0–37)
Alkaline Phosphatase: 72 U/L (ref 39–117)
CO2: 23 mEq/L (ref 19–32)
Calcium: 9.1 mg/dL (ref 8.4–10.5)
GFR calc non Af Amer: >90 mL/min (ref >90)
Potassium: 2.6 mEq/L — CL (ref 3.5–5.1)
Sodium: 138 mEq/L (ref 135–145)
Total Protein: 7.4 g/dL (ref 6.0–8.3)

## 2012-07-11 LAB — URINALYSIS, ROUTINE W REFLEX MICROSCOPIC
Glucose, UA: NEGATIVE mg/dL
Leukocytes, UA: NEGATIVE
Protein, ur: NEGATIVE mg/dL
Specific Gravity, Urine: <1.005 — ABNORMAL LOW (ref 1.005–1.030)
pH: 7 (ref 5.0–8.0)

## 2012-07-11 LAB — URINE MICROSCOPIC-ADD ON

## 2012-07-11 LAB — CBC WITH DIFFERENTIAL/PLATELET
Basophils Absolute: 0 10*3/uL (ref 0.0–0.1)
Eosinophils Relative: 1 % (ref 0–5)
Lymphocytes Relative: 46 % (ref 12–46)
MCV: 85.2 fL (ref 78.0–100.0)
Neutrophils Relative %: 44 % (ref 43–77)
Platelets: 284 10*3/uL (ref 150–400)
RBC: 4.45 MIL/uL (ref 3.87–5.11)
RDW: 13.8 % (ref 11.5–15.5)
WBC: 8.9 10*3/uL (ref 4.0–10.5)

## 2012-07-11 LAB — TROPONIN I: Troponin I: <0.3 ng/mL (ref <0.30)

## 2012-07-11 MED ORDER — DEXLANSOPRAZOLE 60 MG PO CPDR: 60.0000 mg | DELAYED_RELEASE_CAPSULE | Freq: Every day | ORAL | Status: DC

## 2012-07-11 MED ORDER — POTASSIUM CHLORIDE CRYS ER 20 MEQ PO TBCR
40.0000 meq | EXTENDED_RELEASE_TABLET | Freq: Once | ORAL | Status: AC
  Administered 2012-07-11: 40 meq via ORAL
  Filled 2012-07-11: qty 2

## 2012-07-11 MED ORDER — LINACLOTIDE 145 MCG PO CAPS: 145.0000 ug | ORAL_CAPSULE | Freq: Every day | ORAL | Status: DC

## 2012-07-11 MED ORDER — ONDANSETRON HCL 4 MG/2ML IJ SOLN
4.0000 mg | Freq: Once | INTRAMUSCULAR | Status: AC
  Administered 2012-07-11: 4 mg via INTRAVENOUS

## 2012-07-11 NOTE — Assessment & Plan Note (Signed)
Lower abdominal pain, likely secondary to constipation. Patient is s/p partial hysterectomy. Ovaries remain. CT if no improvement with Linzess.

## 2012-07-11 NOTE — ED Notes (Signed)
Much improved, calmer. Her mother and her children at the bedside. Pt remains calm and compliant. Does c/o of increased nausea and mid abd pain

## 2012-07-11 NOTE — ED Notes (Signed)
Patient ambulated to restroom without difficulty. No needs voiced at this time.

## 2012-07-11 NOTE — Assessment & Plan Note (Signed)
New onset, with lower abdominal discomfort relieved after defecation. Miralax caused loose stools. Associated bloating noted. No rectal bleeding or warning signs.  Will trial Linzess 145 mcg daily. No need for colonoscopy unless evidence of rectal bleeding or anything worsens If pain not improved with more aggressive bowel regimen, consider CT.

## 2012-07-11 NOTE — ED Notes (Signed)
Patient unable to void at this time

## 2012-07-11 NOTE — Patient Instructions (Addendum)
Stop Nexium. Start taking Dexilant each morning. This is for reflux. Review the reflux diet.  For your constipation: start taking Linzess 1 capsule 30 minutes before the first meal of the day.  Both of these medications may need to prior authorization. We will help with this.   Please call us back in 1 week with an update. We may need to pursue an upper endoscopy.   Diet for Gastroesophageal Reflux Disease, Adult Reflux (acid reflux) is when acid from your stomach flows up into the esophagus. When acid comes in contact with the esophagus, the acid causes irritation and soreness (inflammation) in the esophagus. When reflux happens often or so severely that it causes damage to the esophagus, it is called gastroesophageal reflux disease (GERD). Nutrition therapy can help ease the discomfort of GERD. FOODS OR DRINKS TO AVOID OR LIMIT  Smoking or chewing tobacco. Nicotine is one of the most potent stimulants to acid production in the gastrointestinal tract.  Caffeinated and decaffeinated coffee and black tea.  Regular or low-calorie carbonated beverages or energy drinks (caffeine-free carbonated beverages are allowed).   Strong spices, such as black pepper, white pepper, red pepper, cayenne, curry powder, and chili powder.  Peppermint or spearmint.  Chocolate.  High-fat foods, including meats and fried foods. Extra added fats including oils, butter, salad dressings, and nuts. Limit these to less than 8 tsp per day.  Fruits and vegetables if they are not tolerated, such as citrus fruits or tomatoes.  Alcohol.  Any food that seems to aggravate your condition. If you have questions regarding your diet, call your caregiver or a registered dietitian. OTHER THINGS THAT MAY HELP GERD INCLUDE:   Eating your meals slowly, in a relaxed setting.  Eating 5 to 6 small meals per day instead of 3 large meals.  Eliminating food for a period of time if it causes distress.  Not lying down until 3  hours after eating a meal.  Keeping the head of your bed raised 6 to 9 inches (15 to 23 cm) by using a foam wedge or blocks under the legs of the bed. Lying flat may make symptoms worse.  Being physically active. Weight loss may be helpful in reducing reflux in overweight or obese adults.  Wear loose fitting clothing EXAMPLE MEAL PLAN This meal plan is approximately 2,000 calories based on https://www.bernard.org/ meal planning guidelines. Breakfast   cup cooked oatmeal.  1 cup strawberries.  1 cup low-fat milk.  1 oz almonds. Snack  1 cup cucumber slices.  6 oz yogurt (made from low-fat or fat-free milk). Lunch  2 slice whole-wheat bread.  2 oz sliced [place].  2 tsp mayonnaise.  1 cup blueberries.  1 cup snap peas. Snack  6 whole-wheat crackers.  1 oz string cheese. Dinner   cup Licea rice.  1 cup mixed veggies.  1 tsp olive oil.  3 oz grilled fish. Document Released: 03/27/2005 Document Revised: 06/19/2011 Document Reviewed: 02/10/2011 [facility] Patient Information 2013 [place], [place].

## 2012-07-11 NOTE — Assessment & Plan Note (Signed)
No association with eating/drinking. Worse at night. Hx of GERD, with prior failure of Protonix, Prilosec, and now Nexium. Trial of Dexilant. Reportedly, she had an EGD ?10 years ago in [place]. Will attempt to retreive records. Contact us in about 1 week. If no improvement, needs EGD. Question if this pain may be associated with constipation/IBS. Will reassess in about 1 week. Pt to call. Discussed risks and benefits of EGD if needed. She states understanding.

## 2012-07-11 NOTE — ED Notes (Signed)
Seen by gastroenterologist today and rx'd with new med. Took her meds and began feeling worse with increased nausea. Got anxious and started hyperventilating. Presents hyperventilating, crying. Pt calmed easily with coaching

## 2012-07-11 NOTE — Progress Notes (Signed)
CC PCP 

## 2012-07-11 NOTE — Progress Notes (Signed)
Referring Provider: Reynolds Bowl, MD Primary Care Physician:  Reynolds Bowl, MD Primary Gastroenterologist:  Dr. Jena Gauss   Chief Complaint  Patient presents with  . Abdominal Pain  . Constipation  . Nausea  . Emesis    HPI:   Ms. Destiny Manning is a 42 year old female presenting today at the request of Emilio Math, [place], secondary to abdominal pain, constipation, nausea, and vomiting. Recent blood work included CBC, which was normal.  Notes months of symptoms. First symptoms were nausea for Nov/Dec. Then in January noted constipation, sometimes loose, LUQ pain, lower abdominal pain that would radiate around lower back. February to now still with all symptoms. No hematochezia. Did note some mucous discharge. No melena. For pain has been taking Ibuprofen, ONLY since onset of symptoms, denies taking in excess prior to symptoms. Intermittent nausea, "gnawing". Pain in LUQ usually occurs at night. No aggravating factors. Will last about 2 hours at a time. No real association with eating. Lower abdominal discomfort prior to BM, much relieved afterward. +abdominal bloating. +wt loss of about 6 lbs. States normal weight is about 135-145. States eating only once per day. History of GERD. Nexium once daily. Tried to get a refill on Nexium, but states she needs prior authorization. Supplementing by using OTC agents. Has tried/failed Prilosec, Protonix.    States she had an upper endoscopy in [place] in remote past, prior to cholecystectomy. Reportedly had an ulcer. ?2002. Colonoscopy in 2002 as well. Through [facility] system.   Past Medical History  Diagnosis Date  . GERD (gastroesophageal reflux disease)     takes Nexium Daily  . Hypertension   . CHF (congestive heart failure) 03/19/08    EF 35-40%  . Asthma dx 06/04/07    ventolin, singulair  . DCM (dilated cardiomyopathy) dx 06/06/07    EF 10-15%  . Seasonal allergies     takes zyrtec    Past Surgical History  Procedure Laterality  Date  . Cholecystectomy  2001    laparoscopic, [facility]  . Cervical spine surgery  2002    [facility], ruptured disc  . Vaginal hysterectomy  10/19/2010    Procedure: HYSTERECTOMY VAGINAL;  Surgeon: Lazaro Arms, MD;  Location: [facility];  Service: Gynecology;  Laterality: N/A;    Current Outpatient Prescriptions  Medication Sig Dispense Refill  . busPIRone (BUSPAR) 15 MG tablet Take 30 mg by mouth daily.        . carvedilol (COREG) 25 MG tablet Take 25 mg by mouth 2 (two) times daily with a meal.       . citalopram (CELEXA) 40 MG tablet Take 40 mg by mouth at bedtime.        . digoxin (LANOXIN) 0.125 MG tablet Take 125 mcg by mouth daily.       Marland Kitchen esomeprazole (NEXIUM) 40 MG capsule Take 40 mg by mouth daily before breakfast.       . fluticasone (FLONASE) 50 MCG/ACT nasal spray Place 1 spray into the nose daily.        . furosemide (LASIX) 20 MG tablet Take 20 mg by mouth 2 (two) times daily.       . montelukast (SINGULAIR) 10 MG tablet Take 10 mg by mouth at bedtime.      Marland Kitchen OLANZapine (ZYPREXA) 10 MG tablet Take 10 mg by mouth at bedtime.      . ramipril (ALTACE) 10 MG capsule Take 10 mg by mouth daily.       Marland Kitchen spironolactone (ALDACTONE) 12.5 mg TABS Take  12.5 mg by mouth 2 (two) times daily.        No current facility-administered medications for this visit.    Allergies as of 07/11/2012  . (No Known Allergies)    Family History  Problem Relation Age of Onset  . Anesthesia problems Neg Hx   . Hypotension Neg Hx   . Pseudochol deficiency Neg Hx   . Malignant hyperthermia Neg Hx     History   Social History  . Marital Status: Single    Spouse Name: N/A    Number of Children: N/A  . Years of Education: N/A   Occupational History  . Not on file.   Social History Main Topics  . Smoking status: Former Smoker    Quit date: 03/14/2009  . Smokeless tobacco: Never Used  . Alcohol Use: No  . Drug Use: No  . Sexually Active:    Other Topics Concern  . Not on file   Social History  Narrative  . No narrative on file    Review of Systems: Gen: SEE HPI CV: negative Resp: +cough GI: SEE HPI GU : Denies urinary burning, urinary frequency, urinary incontinence.  MS: +joint pain Derm: Denies rash, itching, dry skin Psych: Denies depression, anxiety, confusion or memory loss  Heme: Denies bruising, bleeding, and enlarged lymph nodes.  Physical Exam: BP 140/88  Pulse 86  Temp(Src) 97.5 F (36.4 C) (Oral)  Ht 5\' 3"  (1.6 m)  Wt 159 lb 6.4 oz (72.303 kg)  BMI 28.24 kg/m2  LMP 07/04/2010 General:   Alert and oriented. Well-developed, well-nourished, pleasant and cooperative. Head:  Normocephalic and atraumatic. Eyes:  Conjunctiva pink, sclera clear, no icterus.   Conjunctiva pink. Ears:  Normal auditory acuity. Nose:  No deformity, discharge,  or lesions. Mouth:  No deformity or lesions, mucosa pink and moist.  Neck:  Supple, without mass or thyromegaly. Lungs:  Clear to auscultation bilaterally, without wheezing, rales, or rhonchi.  Heart:  S1, S2 present without murmurs noted.  Abdomen:  +BS, soft, non-tender and non-distended. Without mass or HSM. No rebound or guarding. No hernias noted. Rectal:  Deferred  Msk:  Symmetrical without gross deformities. Normal posture. Extremities:  Without clubbing or edema. Neurologic:  Alert and  oriented x4;  grossly normal neurologically. Skin:  Intact, warm and dry without significant lesions or rashes Cervical Nodes:  No significant cervical adenopathy. Psych:  Alert and cooperative. Normal mood and affect.

## 2012-07-11 NOTE — ED Notes (Signed)
CRITICAL VALUE ALERT  Critical value received:  K+ 2.6  Date of notification:  07/11/12  Time of notification:  2305  Critical value read back:  yes Nurse who received alert:  Josph Macho, RN MD notified:  Dr. Fonnie Jarvis  Time:  1308

## 2012-07-12 ENCOUNTER — Telehealth: Payer: Self-pay | Admitting: Internal Medicine

## 2012-07-12 MED ORDER — ONDANSETRON 4 MG PO TBDP: 4.0000 mg | ORAL_TABLET | Freq: Three times a day (TID) | ORAL | Status: DC | PRN

## 2012-07-12 MED ORDER — POTASSIUM CHLORIDE ER 10 MEQ PO TBCR: 10.0000 meq | EXTENDED_RELEASE_TABLET | Freq: Two times a day (BID) | ORAL | Status: DC

## 2012-07-12 NOTE — Telephone Encounter (Signed)
Routing to refill box  

## 2012-07-12 NOTE — Telephone Encounter (Signed)
Per ED note,   She likely has hypokalemia due to lasix. She needs to have this addressed by her PCP. They recommended she had her Potassium rechecked with PCP.  I will send Zofran to [facility]  Will route to [facility] for review as well.

## 2012-07-12 NOTE — ED Provider Notes (Signed)
History     CSN: [id number]  Arrival date & time 07/11/12  2108   First MD Initiated Contact with Patient 07/11/12 2231      Chief Complaint  Patient presents with  . Nausea    (Consider location/radiation/quality/duration/timing/severity/associated sxs/prior treatment) HPI Comments: Patient with hx of chronic lower abdominal pain c/o increased nausea tonight after taking  new medications for constipation and GERD.  She states she was seen by the NP at Dr. Luvenia Starch office today and started on Dexilant and linzess.  She notes that after taking the medications she became very nauseous and hyperventilated.  She states the lower abdominal pain is not affected by food, she denies shortness of breath, chest pain, fever, chills, vomiting or diarrhea.  States the abdominal pain is similar to previous.    Patient is a 42 y.o. female presenting with abdominal pain. The history is provided by the patient.  Abdominal Pain Pain location:  RLQ and LLQ Pain quality: aching   Pain radiates to:  Does not radiate Pain severity:  Mild Onset quality:  Gradual Timing:  Constant Progression:  Unchanged Chronicity:  Chronic Context: laxative use   Context: not alcohol use, not diet changes, not eating, not retching, not sick contacts and not suspicious food intake   Relieved by:  Nothing Associated symptoms: constipation   Associated symptoms: no belching, no chest pain, no chills, no diarrhea, no dysuria, no fatigue, no fever, no flatus, no hematemesis, no hematochezia, no hematuria, no melena, no nausea, no shortness of breath, no vaginal bleeding and no vomiting     Past Medical History  Diagnosis Date  . GERD (gastroesophageal reflux disease)     takes Nexium Daily  . Hypertension   . CHF (congestive heart failure) 03/19/08    EF 35-40%  . Asthma dx 06/04/07    ventolin, singulair  . DCM (dilated cardiomyopathy) dx 06/06/07    EF 10-15%  . Seasonal allergies     takes zyrtec    Past  Surgical History  Procedure Laterality Date  . Cholecystectomy  2001    laparoscopic, [facility]  . Cervical spine surgery  2002    [facility], ruptured disc  . Vaginal hysterectomy  10/19/2010    Procedure: HYSTERECTOMY VAGINAL;  Surgeon: Lazaro Arms, MD;  Location: [facility];  Service: Gynecology;  Laterality: N/A;  . Tubal ligation      Family History  Problem Relation Age of Onset  . Anesthesia problems Neg Hx   . Hypotension Neg Hx   . Pseudochol deficiency Neg Hx   . Malignant hyperthermia Neg Hx   . Colon cancer Neg Hx     History  Substance Use Topics  . Smoking status: Former Smoker    Quit date: 03/14/2009  . Smokeless tobacco: Never Used  . Alcohol Use: No    OB History   Grav Para Term Preterm Abortions TAB SAB Ect Mult Living                  Review of Systems  Constitutional: Negative for fever, chills, appetite change and fatigue.  Respiratory: Negative for chest tightness and shortness of breath.   Cardiovascular: Negative for chest pain.  Gastrointestinal: Positive for abdominal pain and constipation. Negative for nausea, vomiting, diarrhea, blood in stool, melena, hematochezia, abdominal distention, flatus and hematemesis.  Genitourinary: Negative for dysuria, hematuria, flank pain, decreased urine volume, vaginal bleeding and difficulty urinating.  Musculoskeletal: Negative for back pain.  Skin: Negative for color change and rash.  Neurological: Negative for dizziness, weakness and numbness.  Hematological: Negative for adenopathy.  All other systems reviewed and are negative.    Allergies  Review of patient's allergies indicates no known allergies.  Home Medications   Current Outpatient Rx  Name  Route  Sig  Dispense  Refill  . busPIRone (BUSPAR) 15 MG tablet   Oral   Take 30 mg by mouth daily.           . carvedilol (COREG) 25 MG tablet   Oral   Take 25 mg by mouth 2 (two) times daily with a meal.          . citalopram (CELEXA) 40 MG tablet    Oral   Take 40 mg by mouth at bedtime.           Marland Kitchen dexlansoprazole (DEXILANT) 60 MG capsule   Oral   Take 1 capsule (60 mg total) by mouth daily.   30 capsule   3     Tried and failed Prilosec, Protonix, Nexium   . digoxin (LANOXIN) 0.125 MG tablet   Oral   Take 125 mcg by mouth daily.          Marland Kitchen esomeprazole (NEXIUM) 40 MG capsule   Oral   Take 40 mg by mouth daily before breakfast.          . fluticasone (FLONASE) 50 MCG/ACT nasal spray   Nasal   Place 1 spray into the nose daily.           . furosemide (LASIX) 20 MG tablet   Oral   Take 20 mg by mouth 2 (two) times daily.          . Linaclotide (LINZESS) 145 MCG CAPS   Oral   Take 1 capsule (145 mcg total) by mouth daily. 30 minutes before breakfast.   30 capsule   3     Chronic nausea, therefore wanted to hold off on Am ...   . montelukast (SINGULAIR) 10 MG tablet   Oral   Take 10 mg by mouth at bedtime.         Marland Kitchen OLANZapine (ZYPREXA) 10 MG tablet   Oral   Take 10 mg by mouth at bedtime.         . ramipril (ALTACE) 10 MG capsule   Oral   Take 10 mg by mouth daily.          Marland Kitchen spironolactone (ALDACTONE) 12.5 mg TABS   Oral   Take 12.5 mg by mouth 2 (two) times daily.            BP 148/88  Pulse 83  Temp(Src) 98.9 F (37.2 C) (Oral)  Resp 12  Ht 5\' 3"  (1.6 m)  Wt 159 lb (72.122 kg)  BMI 28.17 kg/m2  SpO2 98%  LMP 07/04/2010  Physical Exam  Nursing note and vitals reviewed. Constitutional: She is oriented to person, place, and time. She appears well-developed and well-nourished. No distress.  Patient is smiling and well appearing  HENT:  Head: Normocephalic and atraumatic.  Mouth/Throat: Oropharynx is clear and moist.  Neck: Normal range of motion. Neck supple. No thyromegaly present.  Cardiovascular: Normal rate, regular rhythm, normal heart sounds and intact distal pulses.   No murmur heard. Pulmonary/Chest: Effort normal and breath sounds normal. No respiratory distress.  She exhibits no tenderness.  Abdominal: Soft. Bowel sounds are normal. She exhibits no distension and no mass. There is tenderness in the right lower quadrant and left lower quadrant.  There is no rigidity, no rebound, no guarding, no CVA tenderness and no tenderness at McBurney's point.  Mild , diffuse lower abdominal pain to palpation .  No guarding, rebound tenderness.    Musculoskeletal: She exhibits no edema.  Lymphadenopathy:    She has no cervical adenopathy.  Neurological: She is alert and oriented to person, place, and time.  Skin: Skin is warm and dry.  Psychiatric: She has a normal mood and affect.    ED Course  Procedures (including critical care time)  Results for orders placed during the hospital encounter of 07/11/12  CBC WITH DIFFERENTIAL      Result Value Range   WBC 8.9  4.0 - 10.5 K/uL   RBC 4.45  3.87 - 5.11 MIL/uL   Hemoglobin 13.2  12.0 - 15.0 g/dL   HCT 40.9  81.1 - 91.4 %   MCV 85.2  78.0 - 100.0 fL   MCH 29.7  26.0 - 34.0 pg   MCHC 34.8  30.0 - 36.0 g/dL   RDW 78.2  95.6 - 21.3 %   Platelets 284  150 - 400 K/uL   Neutrophils Relative 44  43 - 77 %   Neutro Abs 3.9  1.7 - 7.7 K/uL   Lymphocytes Relative 46  12 - 46 %   Lymphs Abs 4.0  0.7 - 4.0 K/uL   Monocytes Relative 9  3 - 12 %   Monocytes Absolute 0.8  0.1 - 1.0 K/uL   Eosinophils Relative 1  0 - 5 %   Eosinophils Absolute 0.1  0.0 - 0.7 K/uL   Basophils Relative 0  0 - 1 %   Basophils Absolute 0.0  0.0 - 0.1 K/uL  COMPREHENSIVE METABOLIC PANEL      Result Value Range   Sodium 138  135 - 145 mEq/L   Potassium 2.6 (*) 3.5 - 5.1 mEq/L   Chloride 102  96 - 112 mEq/L   CO2 23  19 - 32 mEq/L   Glucose, Bld 106 (*) 70 - 99 mg/dL   BUN 4 (*) 6 - 23 mg/dL   Creatinine, Ser 0.86  0.50 - 1.10 mg/dL   Calcium 9.1  8.4 - 57.8 mg/dL   Total Protein 7.4  6.0 - 8.3 g/dL   Albumin 4.1  3.5 - 5.2 g/dL   AST 20  0 - 37 U/L   ALT 8  0 - 35 U/L   Alkaline Phosphatase 72  39 - 117 U/L   Total Bilirubin 0.3   0.3 - 1.2 mg/dL   GFR calc non Af Amer >90  >90 mL/min   GFR calc Af Amer >90  >90 mL/min  LIPASE, BLOOD      Result Value Range   Lipase 30  11 - 59 U/L  URINALYSIS, ROUTINE W REFLEX MICROSCOPIC      Result Value Range   Color, Urine YELLOW  YELLOW   APPearance CLEAR  CLEAR   Specific Gravity, Urine <1.005 (*) 1.005 - 1.030   pH 7.0  5.0 - 8.0   Glucose, UA NEGATIVE  NEGATIVE mg/dL   Hgb urine dipstick MODERATE (*) NEGATIVE   Bilirubin Urine NEGATIVE  NEGATIVE   Ketones, ur NEGATIVE  NEGATIVE mg/dL   Protein, ur NEGATIVE  NEGATIVE mg/dL   Urobilinogen, UA 0.2  0.0 - 1.0 mg/dL   Nitrite NEGATIVE  NEGATIVE   Leukocytes, UA NEGATIVE  NEGATIVE  TROPONIN I      Result Value Range   Troponin I <  0.30  <0.30 ng/mL  URINE MICROSCOPIC-ADD ON      Result Value Range   Squamous Epithelial / LPF FEW (*) RARE   WBC, UA 0-2  <3 WBC/hpf   RBC / HPF 0-2  <3 RBC/hpf         MDM    Date: 07/12/2012  Rate:100  Rhythm: normal sinus rhythm  QRS Axis: normal  Intervals: normal  ST/T Wave abnormalities: nonspecific ST/T changes  Conduction Disutrbances:none  Narrative Interpretation:   Old EKG Reviewed: none available    EKG read by Dr. Fleet Contras and reviewed by Dr. Fonnie Jarvis also    Patient c/o diffuse lower abdominal pain that is chronic, was seen by GI earlier today for same.  Patient began new medication for chronic constipation and GERD today.  Per the GI NP's note today, it is believed that the lower abd pain may be related to constipation.     Patient has been resting, smiling and alert, vitals stable,  No vomiting , fever, rebound tenderness or guarding.  No CP or dyspnea.  Labs reviewed, patient is hypokalemic and currently taking Lasix which is likely source.  Will give po potassium tonight .  She is feeling better after zofran and potassium.  Appears stable for d/c.  She agrees to call GI tomorrow .  I will prescribe potassium and she agrees to have K+ rechecked in few days  by her PMD or GI.  The patient appears reasonably screened and/or stabilized for discharge and I doubt any other medical condition or other [facility] requiring further screening, evaluation, or treatment in the ED at this time prior to discharge.   Merleen Picazo L. Mayeli Bornhorst, PA-C 07/12/12 0045

## 2012-07-12 NOTE — Telephone Encounter (Signed)
Pt was seen yesterday and then she went to the ED. She called this morning saying the ED wanted her to follow up with [place]. She is aware of OV with [facility] on 4/17 at 10, but then asked if we could call in a prescription to [facility] for Pot. Chloride and zofran because she will not have enough until she is seen here again on 4/17. Please advise [phone]

## 2012-07-15 NOTE — Telephone Encounter (Signed)
Pt is aware.  

## 2012-07-16 ENCOUNTER — Telehealth: Payer: Self-pay

## 2012-07-16 NOTE — Telephone Encounter (Signed)
Received fax from pharmacy. Pt needs PA for linzess, but pt has not tried and failed amitiza. According to Medicaid she will have to try amitiza before they will pay for linzess. Pt uses [facility].

## 2012-07-16 NOTE — Telephone Encounter (Signed)
Will trial Amitiza. po BID.  Let's give her samples and see how she does.

## 2012-07-16 NOTE — Telephone Encounter (Signed)
Noted. Follow up as planned

## 2012-07-17 NOTE — Telephone Encounter (Signed)
Pt aware. Samples at the front desk.

## 2012-07-22 NOTE — ED Provider Notes (Signed)
Medical screening examination/treatment/procedure(s) were performed by non-physician practitioner and as supervising physician I was immediately available for consultation/collaboration.  Dinesh Ulysse M Westlynn Fifer, MD 07/22/12 1608 

## 2012-07-25 ENCOUNTER — Telehealth: Payer: Self-pay | Admitting: Gastroenterology

## 2012-07-25 ENCOUNTER — Ambulatory Visit: Payer: Medicaid Other | Admitting: Gastroenterology

## 2012-07-25 NOTE — Telephone Encounter (Signed)
Pt was a no show

## 2012-07-29 ENCOUNTER — Telehealth: Payer: Self-pay

## 2012-07-29 MED ORDER — LINACLOTIDE 145 MCG PO CAPS: 145.0000 ug | ORAL_CAPSULE | Freq: Every day | ORAL | Status: DC

## 2012-07-29 NOTE — Telephone Encounter (Signed)
She no-showed her most recent appt. Should be taking a PPI daily.  Let's try Linzess again. I sent prescription to pharmacy. Will likely need PA, but she has tried/failed Amitiza, so we can let medicaid know this.

## 2012-07-29 NOTE — Telephone Encounter (Signed)
The Amitiza is not working. She has been vomiting and now is constipated. Please advise

## 2012-07-29 NOTE — Telephone Encounter (Signed)
Pt is aware. Working on [employer] for [employer]. Advised pt to stop amitiza, #2 boxes of linzess at the front desk for pt to pick up.

## 2012-08-02 ENCOUNTER — Telehealth: Payer: Self-pay | Admitting: Gastroenterology

## 2012-08-02 MED ORDER — ONDANSETRON 4 MG PO TBDP: 4.0000 mg | ORAL_TABLET | Freq: Three times a day (TID) | ORAL | Status: DC | PRN

## 2012-08-02 NOTE — Telephone Encounter (Signed)
Patient is needing her Zofran refilled, having a lot of nausea and vomiting this week, please send to [facility]

## 2012-08-02 NOTE — Addendum Note (Signed)
Addended by: Tiffany Kocher on: 08/02/2012 09:55 AM   Modules accepted: Orders

## 2012-08-02 NOTE — Telephone Encounter (Signed)
zofran rx sent to pharmacy. She needs to reschedule her missed appointment. Continue Linzess. Recommend she use Fleet or tap water enema.  She can also use dulcolax 3 tablets oral today to get things started.

## 2012-08-02 NOTE — Telephone Encounter (Signed)
I called pt. She said she has not had a BM since Sunday. She started the Linzess on Wed. She is feeling very bloated and uncomfortable. She is nauseated and has been vomiting some. Said she hasn't been able to eat much. Tried apple and banana and that didn't stay down. Her mo made her some potato soup with onion and that didn't stay down either. She would like Zofran sent to the pharmacy. Please advise!

## 2012-08-02 NOTE — Telephone Encounter (Signed)
Called and informed pt. Faxed the Zofran Rx to [place] ( it printed out ). OV with [facility] on 08/13/2012 at 10:00 AM.

## 2012-08-13 ENCOUNTER — Encounter: Payer: Self-pay | Admitting: Gastroenterology

## 2012-08-13 ENCOUNTER — Encounter (HOSPITAL_COMMUNITY): Payer: Self-pay | Admitting: Pharmacy Technician

## 2012-08-13 ENCOUNTER — Ambulatory Visit (INDEPENDENT_AMBULATORY_CARE_PROVIDER_SITE_OTHER): Payer: Medicaid Other | Admitting: Gastroenterology

## 2012-08-13 VITALS — BP 174/97 | HR 78 | Temp 98.2°F | Ht 61.0 in | Wt 166.6 lb

## 2012-08-13 DIAGNOSIS — R1310 Dysphagia, unspecified: Secondary | ICD-10-CM | POA: Insufficient documentation

## 2012-08-13 DIAGNOSIS — K219 Gastro-esophageal reflux disease without esophagitis: Secondary | ICD-10-CM | POA: Insufficient documentation

## 2012-08-13 DIAGNOSIS — K5900 Constipation, unspecified: Secondary | ICD-10-CM

## 2012-08-13 MED ORDER — DEXLANSOPRAZOLE 60 MG PO CPDR: 60.0000 mg | DELAYED_RELEASE_CAPSULE | Freq: Two times a day (BID) | ORAL | Status: DC

## 2012-08-13 MED ORDER — LINACLOTIDE 290 MCG PO CAPS: 1.0000 | ORAL_CAPSULE | Freq: Every day | ORAL | Status: DC

## 2012-08-13 NOTE — Patient Instructions (Addendum)
Increase Dexilant to twice a day. I have sent in the prescription.  Start taking Linzess 290 mcg each day. This is a higher dose than what you were taking. We have given samples and sent to the pharmacy.   We have set you up for an upper endoscopy with possible dilation with Dr. Jena Gauss in the near future. Further recommendations to follow!   HAPPY BIRTHDAY!!!!

## 2012-08-13 NOTE — Assessment & Plan Note (Signed)
Query secondary to uncontrolled GERD, possible web, ring, or stricture.

## 2012-08-13 NOTE — Assessment & Plan Note (Signed)
42 year old female with chronic GERD, now with solid food and pill dysphagia. No prior dilations to her knowledge, and the last EGD was >10 years ago (unclear who performed or where). She has no weight loss; in fact, she has been steadily gaining weight. GERD symptoms are uncontrolled on once daily Dexilant. She has tried/failed numerous others prior. Vague LUQ discomfort/LLQ discomfort which is likely a component of constipation. Recommend EGD due to vague dysphagia, which will also help identify if any underlying gastritis, less likely PUD contributing to vague LUQ discomfort. At this point, I feel abdominal pain is secondary to constipation (see constipation). Dysphagia likely uncontrolled GERD but unable to rule out esophageal web, ring, or stricture.  Proceed with upper endoscopy with dilation in the near future with Dr. Jena Gauss. The risks, benefits, and alternatives have been discussed in detail with patient. They have stated understanding and desire to proceed.  Increase Dexilant to BID for short-term Weight loss recommended

## 2012-08-13 NOTE — Assessment & Plan Note (Signed)
Failed Amitiza. Linzess 145 mcg not adequately treating constipation. Increase to Linzess 290 mcg. High fiber diet as previously instructed. No rectal bleeding, no need for colonoscopy until age 42. Likely vague left-sided discomfort related to constipation/IBS. No concerning signs/symptoms noted.

## 2012-08-13 NOTE — Progress Notes (Signed)
Referring Provider: Comstock, Lloyd, MD Primary Care Physician:  CLAGGETT,ELIN, PA-C Primary GI: Dr. Rourk   Chief Complaint  Patient presents with  . Follow-up    HPI:   41-year-old female presenting in follow-up for constipation and LUQ pain. Started on Dexilant and Linzess at last visit. Last good BM Friday last week (which was 5 days ago). Notes LUQ discomfort, LLQ discomfort, sharp. Not worse with constipation or associated with bowel habits. Noted at night is worse. No association with eating/drinking. Mild nausea. No rectal bleeding. Dexilant not helping, still feels acid in chest. Anything that she eats, drinks, any pills will get "lodged". Has to drink extra water. Last EGD about 15 years ago, but she is unsure about any dilations. No fever or chills.   Steady weight gain.   Past Medical History  Diagnosis Date  . GERD (gastroesophageal reflux disease)     takes Nexium Daily  . Hypertension   . CHF (congestive heart failure) 03/19/08    EF 35-40%  . Asthma dx 06/04/07    ventolin, singulair  . DCM (dilated cardiomyopathy) dx 06/06/07    EF 10-15%  . Seasonal allergies     takes zyrtec    Past Surgical History  Procedure Laterality Date  . Cholecystectomy  2001    laparoscopic, [facility]  . Cervical spine surgery  2002    [facility], ruptured disc  . Vaginal hysterectomy  10/19/2010    Procedure: HYSTERECTOMY VAGINAL;  Surgeon: Luther H Eure, MD;  Location: [facility];  Service: Gynecology;  Laterality: N/A;  . Tubal ligation      Current Outpatient Prescriptions  Medication Sig Dispense Refill  . busPIRone (BUSPAR) 15 MG tablet Take 30 mg by mouth daily.        . carvedilol (COREG) 25 MG tablet Take 25 mg by mouth 2 (two) times daily with a meal.       . citalopram (CELEXA) 40 MG tablet Take 40 mg by mouth at bedtime.        . dexlansoprazole (DEXILANT) 60 MG capsule Take 1 capsule (60 mg total) by mouth daily.  30 capsule  3  . digoxin (LANOXIN) 0.125 MG tablet Take 125 mcg by  mouth daily.       . fluticasone (FLONASE) 50 MCG/ACT nasal spray Place 1 spray into the nose daily.        . furosemide (LASIX) 20 MG tablet Take 20 mg by mouth 2 (two) times daily.       . Linaclotide (LINZESS) 145 MCG CAPS Take 1 capsule (145 mcg total) by mouth daily. 30 minutes before breakfast.  30 capsule  3  . montelukast (SINGULAIR) 10 MG tablet Take 10 mg by mouth at bedtime.      . OLANZapine (ZYPREXA) 10 MG tablet Take 10 mg by mouth at bedtime.      . ondansetron (ZOFRAN ODT) 4 MG disintegrating tablet Take 1 tablet (4 mg total) by mouth every 8 (eight) hours as needed for nausea.  12 tablet  0  . potassium chloride (K-DUR) 10 MEQ tablet Take 1 tablet (10 mEq total) by mouth 2 (two) times daily.  6 tablet  0  . ramipril (ALTACE) 10 MG capsule Take 10 mg by mouth daily.       . spironolactone (ALDACTONE) 12.5 mg TABS Take 12.5 mg by mouth 2 (two) times daily.       . esomeprazole (NEXIUM) 40 MG capsule Take 40 mg by mouth daily before breakfast.          No current facility-administered medications for this visit.    Allergies as of 08/13/2012  . (No Known Allergies)    Family History  Problem Relation Age of Onset  . Anesthesia problems Neg Hx   . Hypotension Neg Hx   . Pseudochol deficiency Neg Hx   . Malignant hyperthermia Neg Hx   . Colon cancer Neg Hx     History   Social History  . Marital Status: Single    Spouse Name: N/A    Number of Children: N/A  . Years of Education: N/A   Social History Main Topics  . Smoking status: Former Smoker    Quit date: 03/14/2009  . Smokeless tobacco: Never Used  . Alcohol Use: No  . Drug Use: No  . Sexually Active: None   Other Topics Concern  . None   Social History Narrative  . None    Review of Systems: Negative unless mentioned in HPI.   Physical Exam: BP 174/97  Pulse 78  Temp(Src) 98.2 F (36.8 C) (Oral)  Ht 5' 1" (1.549 m)  Wt 166 lb 9.6 oz (75.569 kg)  BMI 31.49 kg/m2  LMP 07/04/2010 General:    Alert and oriented. No distress noted. Pleasant and cooperative.  Head:  Normocephalic and atraumatic. Eyes:  Conjuctiva clear without scleral icterus. Mouth:  Oral mucosa pink and moist. Good dentition. No lesions. Heart:  S1, S2 present without murmurs, rubs, or gallops. Regular rate and rhythm. Abdomen:  +BS, soft, non-tender and non-distended. No rebound or guarding. No HSM or masses noted. Msk:  Symmetrical without gross deformities. Normal posture. Extremities:  Without edema. Neurologic:  Alert and  oriented x4;  grossly normal neurologically. Skin:  Intact without significant lesions or rashes. Psych:  Alert and cooperative. Normal mood and affect.  

## 2012-08-26 ENCOUNTER — Telehealth: Payer: Self-pay | Admitting: Internal Medicine

## 2012-08-26 NOTE — Telephone Encounter (Signed)
Pt called to [facility] her procedure that is scheduled for 5/22 with RMR. Her son is graduating this day and she needs to schedule for another day. Please call her [phone]

## 2012-08-26 NOTE — Telephone Encounter (Signed)
RS

## 2012-09-05 ENCOUNTER — Ambulatory Visit (HOSPITAL_COMMUNITY)
Admission: RE | Admit: 2012-09-05 | Discharge: 2012-09-05 | Disposition: A | Discharge status: Home / Self Care | Payer: Medicaid Other | Source: Ambulatory Visit | Attending: Internal Medicine | Admitting: Internal Medicine

## 2012-09-05 ENCOUNTER — Encounter (HOSPITAL_COMMUNITY): Payer: Self-pay

## 2012-09-05 ENCOUNTER — Encounter (HOSPITAL_COMMUNITY)
Admission: RE | Disposition: A | Discharge status: Home / Self Care | Payer: Self-pay | Source: Ambulatory Visit | Attending: Internal Medicine

## 2012-09-05 DIAGNOSIS — Z79899 Other long term (current) drug therapy: Secondary | ICD-10-CM | POA: Insufficient documentation

## 2012-09-05 DIAGNOSIS — K449 Diaphragmatic hernia without obstruction or gangrene: Secondary | ICD-10-CM | POA: Insufficient documentation

## 2012-09-05 DIAGNOSIS — Z87891 Personal history of nicotine dependence: Secondary | ICD-10-CM | POA: Insufficient documentation

## 2012-09-05 DIAGNOSIS — R1310 Dysphagia, unspecified: Secondary | ICD-10-CM

## 2012-09-05 DIAGNOSIS — K5900 Constipation, unspecified: Secondary | ICD-10-CM | POA: Insufficient documentation

## 2012-09-05 DIAGNOSIS — R110 Nausea: Secondary | ICD-10-CM | POA: Insufficient documentation

## 2012-09-05 DIAGNOSIS — I10 Essential (primary) hypertension: Secondary | ICD-10-CM | POA: Insufficient documentation

## 2012-09-05 DIAGNOSIS — K219 Gastro-esophageal reflux disease without esophagitis: Secondary | ICD-10-CM

## 2012-09-05 DIAGNOSIS — I509 Heart failure, unspecified: Secondary | ICD-10-CM | POA: Insufficient documentation

## 2012-09-05 DIAGNOSIS — R635 Abnormal weight gain: Secondary | ICD-10-CM | POA: Insufficient documentation

## 2012-09-05 DIAGNOSIS — I428 Other cardiomyopathies: Secondary | ICD-10-CM | POA: Insufficient documentation

## 2012-09-05 DIAGNOSIS — R1012 Left upper quadrant pain: Secondary | ICD-10-CM | POA: Insufficient documentation

## 2012-09-05 DIAGNOSIS — J45909 Unspecified asthma, uncomplicated: Secondary | ICD-10-CM | POA: Insufficient documentation

## 2012-09-05 HISTORY — PX: ESOPHAGOGASTRODUODENOSCOPY (EGD) WITH ESOPHAGEAL DILATION: SHX5812

## 2012-09-05 SURGERY — ESOPHAGOGASTRODUODENOSCOPY (EGD) WITH ESOPHAGEAL DILATION
Anesthesia: Moderate Sedation

## 2012-09-05 MED ORDER — MEPERIDINE HCL 100 MG/ML IJ SOLN
INTRAMUSCULAR | Status: AC
  Filled 2012-09-05: qty 1

## 2012-09-05 MED ORDER — MIDAZOLAM HCL 5 MG/5ML IJ SOLN
INTRAMUSCULAR | Status: DC | PRN
  Administered 2012-09-05 (×3): 2 mg via INTRAVENOUS
  Administered 2012-09-05: 1 mg via INTRAVENOUS

## 2012-09-05 MED ORDER — ONDANSETRON HCL 4 MG/2ML IJ SOLN
INTRAMUSCULAR | Status: DC | PRN
  Administered 2012-09-05: 4 mg via INTRAVENOUS

## 2012-09-05 MED ORDER — SODIUM CHLORIDE 0.9 % IV SOLN
INTRAVENOUS | Status: DC
  Administered 2012-09-05: 11:00:00 via INTRAVENOUS

## 2012-09-05 MED ORDER — MIDAZOLAM HCL 5 MG/5ML IJ SOLN
INTRAMUSCULAR | Status: AC
  Filled 2012-09-05: qty 10

## 2012-09-05 MED ORDER — MEPERIDINE HCL 100 MG/ML IJ SOLN
INTRAMUSCULAR | Status: DC | PRN
  Administered 2012-09-05: 25 mg via INTRAVENOUS
  Administered 2012-09-05 (×2): 50 mg via INTRAVENOUS

## 2012-09-05 MED ORDER — ONDANSETRON HCL 4 MG/2ML IJ SOLN
INTRAMUSCULAR | Status: AC
  Filled 2012-09-05: qty 2

## 2012-09-05 MED ORDER — BUTAMBEN-TETRACAINE-BENZOCAINE 2-2-14 % EX AERO
INHALATION_SPRAY | CUTANEOUS | Status: DC | PRN
  Administered 2012-09-05: 2 via TOPICAL

## 2012-09-05 MED ORDER — STERILE WATER FOR IRRIGATION IR SOLN
Status: DC | PRN
  Administered 2012-09-05: 12:00:00

## 2012-09-05 NOTE — H&P (View-Only) (Signed)
Referring Provider: Reynolds Bowl, MD Primary Care Physician:  Alleen Borne Primary GI: Dr. Jena Gauss   Chief Complaint  Patient presents with  . Follow-up    HPI:   42 year old female presenting in follow-up for constipation and LUQ pain. Started on Dexilant and Linzess at last visit. Last good BM Friday last week (which was 5 days ago). Notes LUQ discomfort, LLQ discomfort, sharp. Not worse with constipation or associated with bowel habits. Noted at night is worse. No association with eating/drinking. Mild nausea. No rectal bleeding. Dexilant not helping, still feels acid in chest. Anything that she eats, drinks, any pills will get "lodged". Has to drink extra water. Last EGD about 15 years ago, but she is unsure about any dilations. No fever or chills.   Steady weight gain.   Past Medical History  Diagnosis Date  . GERD (gastroesophageal reflux disease)     takes Nexium Daily  . Hypertension   . CHF (congestive heart failure) 03/19/08    EF 35-40%  . Asthma dx 06/04/07    ventolin, singulair  . DCM (dilated cardiomyopathy) dx 06/06/07    EF 10-15%  . Seasonal allergies     takes zyrtec    Past Surgical History  Procedure Laterality Date  . Cholecystectomy  2001    laparoscopic, [facility]  . Cervical spine surgery  2002    [facility], ruptured disc  . Vaginal hysterectomy  10/19/2010    Procedure: HYSTERECTOMY VAGINAL;  Surgeon: Lazaro Arms, MD;  Location: [facility];  Service: Gynecology;  Laterality: N/A;  . Tubal ligation      Current Outpatient Prescriptions  Medication Sig Dispense Refill  . busPIRone (BUSPAR) 15 MG tablet Take 30 mg by mouth daily.        . carvedilol (COREG) 25 MG tablet Take 25 mg by mouth 2 (two) times daily with a meal.       . citalopram (CELEXA) 40 MG tablet Take 40 mg by mouth at bedtime.        Marland Kitchen dexlansoprazole (DEXILANT) 60 MG capsule Take 1 capsule (60 mg total) by mouth daily.  30 capsule  3  . digoxin (LANOXIN) 0.125 MG tablet Take 125 mcg by  mouth daily.       . fluticasone (FLONASE) 50 MCG/ACT nasal spray Place 1 spray into the nose daily.        . furosemide (LASIX) 20 MG tablet Take 20 mg by mouth 2 (two) times daily.       . Linaclotide (LINZESS) 145 MCG CAPS Take 1 capsule (145 mcg total) by mouth daily. 30 minutes before breakfast.  30 capsule  3  . montelukast (SINGULAIR) 10 MG tablet Take 10 mg by mouth at bedtime.      Marland Kitchen OLANZapine (ZYPREXA) 10 MG tablet Take 10 mg by mouth at bedtime.      . ondansetron (ZOFRAN ODT) 4 MG disintegrating tablet Take 1 tablet (4 mg total) by mouth every 8 (eight) hours as needed for nausea.  12 tablet  0  . potassium chloride (K-DUR) 10 MEQ tablet Take 1 tablet (10 mEq total) by mouth 2 (two) times daily.  6 tablet  0  . ramipril (ALTACE) 10 MG capsule Take 10 mg by mouth daily.       Marland Kitchen spironolactone (ALDACTONE) 12.5 mg TABS Take 12.5 mg by mouth 2 (two) times daily.       Marland Kitchen esomeprazole (NEXIUM) 40 MG capsule Take 40 mg by mouth daily before breakfast.  No current facility-administered medications for this visit.    Allergies as of 08/13/2012  . (No Known Allergies)    Family History  Problem Relation Age of Onset  . Anesthesia problems Neg Hx   . Hypotension Neg Hx   . Pseudochol deficiency Neg Hx   . Malignant hyperthermia Neg Hx   . Colon cancer Neg Hx     History   Social History  . Marital Status: Single    Spouse Name: N/A    Number of Children: N/A  . Years of Education: N/A   Social History Main Topics  . Smoking status: Former Smoker    Quit date: 03/14/2009  . Smokeless tobacco: Never Used  . Alcohol Use: No  . Drug Use: No  . Sexually Active: None   Other Topics Concern  . None   Social History Narrative  . None    Review of Systems: Negative unless mentioned in HPI.   Physical Exam: BP 174/97  Pulse 78  Temp(Src) 98.2 F (36.8 C) (Oral)  Ht 5\' 1"  (1.549 m)  Wt 166 lb 9.6 oz (75.569 kg)  BMI 31.49 kg/m2  LMP 07/04/2010 General:    Alert and oriented. No distress noted. Pleasant and cooperative.  Head:  Normocephalic and atraumatic. Eyes:  Conjuctiva clear without scleral icterus. Mouth:  Oral mucosa pink and moist. Good dentition. No lesions. Heart:  S1, S2 present without murmurs, rubs, or gallops. Regular rate and rhythm. Abdomen:  +BS, soft, non-tender and non-distended. No rebound or guarding. No HSM or masses noted. Msk:  Symmetrical without gross deformities. Normal posture. Extremities:  Without edema. Neurologic:  Alert and  oriented x4;  grossly normal neurologically. Skin:  Intact without significant lesions or rashes. Psych:  Alert and cooperative. Normal mood and affect.

## 2012-09-05 NOTE — Interval H&P Note (Signed)
History and Physical Interval Note:  09/05/2012 12:12 PM  Destiny Manning  has presented today for surgery, with the diagnosis of GERD, Dysphagia, Constipation  The various methods of treatment have been discussed with the patient and family. After consideration of risks, benefits and other options for treatment, the patient has consented to  Procedure(s) with comments: ESOPHAGOGASTRODUODENOSCOPY (EGD) WITH ESOPHAGEAL DILATION (N/A) - 12:15-rescheduled to 11:30 Destiny Manning notified pt as a surgical intervention .  The patient's history has been reviewed, patient examined, no change in status, stable for surgery.  I have reviewed the patient's chart and labs.  Questions were answered to the patient's satisfaction.     Destiny Manning  EGD with esophageal dilation as appropriate.The risks, benefits, limitations, alternatives and imponderables have been reviewed with the patient. Potential for esophageal dilation, biopsy, etc. have also been reviewed.  Questions have been answered. All parties agreeable.

## 2012-09-05 NOTE — Op Note (Signed)
[facility] [address] [place], [address]   ENDOSCOPY PROCEDURE REPORT  PATIENT: Destiny Manning, Destiny Manning  MR#: [id number] BIRTHDATE: 1970/11/02 , 42  yrs. old GENDER: Female ENDOSCOPIST: R.  Roetta Sessions, MD FACP [facility] REFERRED BY:  Reynolds Bowl, M.D. PROCEDURE DATE:  09/05/2012 PROCEDURE:    EGD with Elease Hashimoto dilation  INDICATIONS:   GERD; esophageal dysphagia -  constipation and GERD symptoms better with recent treatment  INFORMED CONSENT:   The risks, benefits, limitations, alternatives and imponderables have been discussed.  The potential for biopsy, esophogeal dilation, etc. have also been reviewed.  Questions have been answered.  All parties agreeable.  Please see the history and physical in the medical record for more information.  MEDICATIONS:   Versed 7 mg IV and Demerol 125 mg by Cetacaine spray.  DESCRIPTION OF PROCEDURE:   The EG-[id number] ([id number])  endoscope was introduced through the mouth and advanced to the second portion of the duodenum without difficulty or limitations.  The mucosal surfaces were surveyed very carefully during advancement of the scope and upon withdrawal.  Retroflexion view of the proximal stomach and esophagogastric junction was performed.      FINDINGS: Normal-appearing, patent tubular esophagus. Stomach empty. Small hiatal hernia. Normal gastric mucosa. Patent pylorus. Normal first and second portion of the duodenum.  THERAPEUTIC / DIAGNOSTIC MANEUVERS PERFORMED:  A 54 French Maloney dilator was passed to full insertion easily. A look back revealed a superfical tear trhough the UES mucosa..   COMPLICATIONS:  None  IMPRESSION:  Small hiatal hernia. Status post esophageal dilation. We as described above  RECOMMENDATIONS:  Continue twice a day Dexilant. Continues Linzess; Office followup 4 weeks.    _______________________________ R. Roetta Sessions, MD FACP [facility] eSigned:  R. Roetta Sessions, MD FACP [facility] 09/05/2012  12:47 PM     CC:  PATIENT NAME:  Destiny Manning, Destiny Manning MR#: [id number]

## 2012-09-06 ENCOUNTER — Encounter (HOSPITAL_COMMUNITY): Payer: Self-pay | Admitting: Internal Medicine

## 2012-10-01 ENCOUNTER — Encounter: Payer: Self-pay | Admitting: Internal Medicine

## 2012-10-02 ENCOUNTER — Ambulatory Visit (INDEPENDENT_AMBULATORY_CARE_PROVIDER_SITE_OTHER): Payer: Medicaid Other | Admitting: Gastroenterology

## 2012-10-02 ENCOUNTER — Encounter: Payer: Self-pay | Admitting: Gastroenterology

## 2012-10-02 ENCOUNTER — Other Ambulatory Visit: Payer: Self-pay | Admitting: Gastroenterology

## 2012-10-02 VITALS — BP 158/97 | HR 75 | Temp 98.4°F | Ht 63.0 in | Wt 155.0 lb

## 2012-10-02 DIAGNOSIS — R1319 Other dysphagia: Secondary | ICD-10-CM

## 2012-10-02 DIAGNOSIS — K5900 Constipation, unspecified: Secondary | ICD-10-CM

## 2012-10-02 DIAGNOSIS — R1310 Dysphagia, unspecified: Secondary | ICD-10-CM

## 2012-10-02 NOTE — Progress Notes (Signed)
Referring Provider: Claggett, Autumn Patty, PA-C Primary Care Physician:  Alleen Borne Primary GI: Dr. Jena Gauss   Chief Complaint  Patient presents with  . Follow-up    HPI:   42 year old female presents today in follow-up after EGD/ED due to dysphagia. Empiric dilation with 54-F Elease Hashimoto performed. Linzess increased to 290 mcg at visit in May 2014.   Constipation: Much improvement with Linzess 290 mcg. No rectal bleeding. If doesn't take Linzess, no bowel movement.    GERD: Controlled with Dexilant. States when she eats/drinks notes sensation of something stuck mid-esophagus. Only occurs with eating/drinking. Dysphagia improved somewhat after dilation.    Past Medical History  Diagnosis Date  . GERD (gastroesophageal reflux disease)     takes Nexium Daily  . Hypertension   . CHF (congestive heart failure) 03/19/08    EF 35-40%  . Asthma dx 06/04/07    ventolin, singulair  . DCM (dilated cardiomyopathy) dx 06/06/07    EF 10-15%  . Seasonal allergies     takes zyrtec    Past Surgical History  Procedure Laterality Date  . Cholecystectomy  2001    laparoscopic, [facility]  . Cervical spine surgery  2002    [facility], ruptured disc  . Vaginal hysterectomy  10/19/2010    Procedure: HYSTERECTOMY VAGINAL;  Surgeon: Lazaro Arms, MD;  Location: [facility];  Service: Gynecology;  Laterality: N/A;  . Tubal ligation    . Esophagogastroduodenoscopy (egd) with esophageal dilation N/A 09/05/2012    WRU:EAVWU hiatal hernia s/p esophageal dilation with 54 F Maloney.     Current Outpatient Prescriptions  Medication Sig Dispense Refill  . busPIRone (BUSPAR) 15 MG tablet Take 30 mg by mouth daily.        . carvedilol (COREG) 25 MG tablet Take 25 mg by mouth 2 (two) times daily with a meal.       . citalopram (CELEXA) 40 MG tablet Take 40 mg by mouth at bedtime.        Marland Kitchen dexlansoprazole (DEXILANT) 60 MG capsule Take 1 capsule (60 mg total) by mouth 2 (two) times daily.  60 capsule  3  . digoxin (LANOXIN)  0.125 MG tablet Take 125 mcg by mouth daily.       . fluticasone (FLONASE) 50 MCG/ACT nasal spray Place 1 spray into the nose daily.        . furosemide (LASIX) 20 MG tablet Take 20 mg by mouth 2 (two) times daily.       . Linaclotide (LINZESS) 290 MCG CAPS Take 1 capsule by mouth daily.  30 capsule  3  . montelukast (SINGULAIR) 10 MG tablet Take 10 mg by mouth at bedtime.      Marland Kitchen OLANZapine (ZYPREXA) 10 MG tablet Take 10 mg by mouth at bedtime.      . ondansetron (ZOFRAN ODT) 4 MG disintegrating tablet Take 1 tablet (4 mg total) by mouth every 8 (eight) hours as needed for nausea.  12 tablet  0  . potassium chloride (K-DUR) 10 MEQ tablet Take 1 tablet (10 mEq total) by mouth 2 (two) times daily.  6 tablet  0  . ramipril (ALTACE) 10 MG capsule Take 10 mg by mouth daily.       Marland Kitchen spironolactone (ALDACTONE) 12.5 mg TABS Take 12.5 mg by mouth 2 (two) times daily.        No current facility-administered medications for this visit.    Allergies as of 10/02/2012  . (No Known Allergies)    Family History  Problem Relation  Age of Onset  . Anesthesia problems Neg Hx   . Hypotension Neg Hx   . Pseudochol deficiency Neg Hx   . Malignant hyperthermia Neg Hx   . Colon cancer Neg Hx     History   Social History  . Marital Status: Single    Spouse Name: N/A    Number of Children: N/A  . Years of Education: N/A   Social History Main Topics  . Smoking status: Former Smoker    Quit date: 03/14/2009  . Smokeless tobacco: Never Used  . Alcohol Use: No  . Drug Use: No  . Sexually Active: None   Other Topics Concern  . None   Social History Narrative  . None    Review of Systems: Negative unless mentioned in HPI.  Physical Exam: BP 158/97  Pulse 75  Temp(Src) 98.4 F (36.9 C) (Oral)  Ht 5\' 3"  (1.6 m)  Wt 155 lb (70.308 kg)  BMI 27.46 kg/m2  LMP 07/04/2010 General:   Alert and oriented. No distress noted. Pleasant and cooperative.  Head:  Normocephalic and atraumatic. Eyes:   Conjuctiva clear without scleral icterus. Abdomen:  +BS, soft, very mild TTP left-sided abdomen and non-distended. No rebound or guarding. No HSM or masses noted. Msk:  Symmetrical without gross deformities. Normal posture. Extremities:  Without edema. Neurologic:  Alert and  oriented x4;  grossly normal neurologically. Skin:  Intact without significant lesions or rashes. Psych:  Alert and cooperative. Normal mood and affect.

## 2012-10-02 NOTE — Assessment & Plan Note (Signed)
Persistent despite recent dilation. Occurs with eating/drinking. Question underlying motility disorder. Continue Dexilant, proceed with BPE. 3 mos return

## 2012-10-02 NOTE — Assessment & Plan Note (Signed)
Excellent results with Linzess 290 mcg daily. Discussed high fiber diet. First screening colonoscopy at age 42 unless otherwise indicated. 3 month return.

## 2012-10-02 NOTE — Progress Notes (Signed)
Cc PCP 

## 2012-10-02 NOTE — Patient Instructions (Addendum)
Follow a high fiber diet and drink at least 8 glasses of water daily.   Continue Dexilant and Linzess.   We will see you back in 3 months. The xray we have ordered will further assess your esophagus. Further recommendations once this is completed.   High-Fiber Diet Fiber is found in fruits, vegetables, and grains. A high-fiber diet encourages the addition of more whole grains, legumes, fruits, and vegetables in your diet. The recommended amount of fiber for adult males is 38 g per day. For adult females, it is 25 g per day. Pregnant and lactating women should get 28 g of fiber per day. If you have a digestive or bowel problem, ask your caregiver for advice before adding high-fiber foods to your diet. Eat a variety of high-fiber foods instead of only a select few type of foods.  PURPOSE  To increase stool bulk.  To make bowel movements more regular to prevent constipation.  To lower cholesterol.  To prevent overeating. WHEN IS THIS DIET USED?  It may be used if you have constipation and hemorrhoids.  It may be used if you have uncomplicated diverticulosis (intestine condition) and irritable bowel syndrome.  It may be used if you need help with weight management.  It may be used if you want to add it to your diet as a protective measure against atherosclerosis, diabetes, and cancer. SOURCES OF FIBER  Whole-grain breads and cereals.  Fruits, such as apples, oranges, bananas, berries, prunes, and pears.  Vegetables, such as green peas, carrots, sweet potatoes, beets, broccoli, cabbage, spinach, and artichokes.  Legumes, such split peas, soy, lentils.  Almonds. FIBER CONTENT IN FOODS Starches and Grains / Dietary Fiber (g)  Cheerios, 1 cup / 3 g  Corn Flakes cereal, 1 cup / 0.7 g  Rice crispy treat cereal, 1 cup / 0.3 g  Instant oatmeal (cooked),  cup / 2 g  Frosted wheat cereal, 1 cup / 5.1 g  [place], long-grain rice (cooked), 1 cup / 3.5 g  White, long-grain rice  (cooked), 1 cup / 0.6 g  Enriched macaroni (cooked), 1 cup / 2.5 g Legumes / Dietary Fiber (g)  Baked beans (canned, plain, or vegetarian),  cup / 5.2 g  Kidney beans (canned),  cup / 6.8 g  Pinto beans (cooked),  cup / 5.5 g Breads and Crackers / Dietary Fiber (g)  Plain or honey graham crackers, 2 squares / 0.7 g  Saltine crackers, 3 squares / 0.3 g  Plain, salted pretzels, 10 pieces / 1.8 g  Whole-wheat bread, 1 slice / 1.9 g  White bread, 1 slice / 0.7 g  Raisin bread, 1 slice / 1.2 g  Plain bagel, 3 oz / 2 g  Flour tortilla, 1 oz / 0.9 g  Corn tortilla, 1 small / 1.5 g  Hamburger or hotdog bun, 1 small / 0.9 g Fruits / Dietary Fiber (g)  Apple with skin, 1 medium / 4.4 g  Sweetened applesauce,  cup / 1.5 g  Banana,  medium / 1.5 g  Grapes, 10 grapes / 0.4 g  Orange, 1 small / 2.3 g  Raisin, 1.5 oz / 1.6 g  Melon, 1 cup / 1.4 g Vegetables / Dietary Fiber (g)  Green beans (canned),  cup / 1.3 g  Carrots (cooked),  cup / 2.3 g  Broccoli (cooked),  cup / 2.8 g  Peas (cooked),  cup / 4.4 g  Mashed potatoes,  cup / 1.6 g  Lettuce, 1 cup /  0.5 g  Corn (canned),  cup / 1.6 g  Tomato,  cup / 1.1 g Document Released: 03/27/2005 Document Revised: 09/26/2011 Document Reviewed: 06/29/2011 [facility] Patient Information 2014 [employer], [place].

## 2012-10-04 ENCOUNTER — Ambulatory Visit (HOSPITAL_COMMUNITY)
Admission: RE | Admit: 2012-10-04 | Discharge: 2012-10-04 | Disposition: A | Discharge status: Home / Self Care | Payer: Medicaid Other | Source: Ambulatory Visit | Attending: Gastroenterology | Admitting: Gastroenterology

## 2012-10-04 DIAGNOSIS — R1310 Dysphagia, unspecified: Secondary | ICD-10-CM | POA: Insufficient documentation

## 2012-10-04 DIAGNOSIS — R1319 Other dysphagia: Secondary | ICD-10-CM

## 2012-10-17 NOTE — Progress Notes (Signed)
Quick Note:  BPE reviewed:  No evidence of motility disorder.  No mass or stricture, tablet passes without delay.  May increase Dexilant to BID for a month, then back to once daily. Refer to ENT if no improvement with PPI BID. ______

## 2013-01-02 ENCOUNTER — Ambulatory Visit: Payer: Medicaid Other | Admitting: Gastroenterology

## 2013-01-06 ENCOUNTER — Ambulatory Visit: Payer: Medicaid Other | Admitting: Gastroenterology

## 2013-01-06 ENCOUNTER — Telehealth: Payer: Self-pay | Admitting: Gastroenterology

## 2013-01-06 ENCOUNTER — Encounter: Payer: Self-pay | Admitting: Gastroenterology

## 2013-01-06 NOTE — Telephone Encounter (Signed)
Pt was a no show

## 2013-01-06 NOTE — Telephone Encounter (Signed)
MAILED LETTER °

## 2013-01-13 ENCOUNTER — Other Ambulatory Visit: Payer: Self-pay | Admitting: Obstetrics & Gynecology

## 2013-02-06 ENCOUNTER — Other Ambulatory Visit: Payer: Self-pay | Admitting: Obstetrics & Gynecology

## 2013-03-03 ENCOUNTER — Encounter (INDEPENDENT_AMBULATORY_CARE_PROVIDER_SITE_OTHER): Payer: Self-pay

## 2013-03-03 ENCOUNTER — Other Ambulatory Visit: Payer: Self-pay | Admitting: Obstetrics & Gynecology

## 2013-03-19 ENCOUNTER — Other Ambulatory Visit: Payer: Self-pay | Admitting: Obstetrics & Gynecology

## 2013-03-20 ENCOUNTER — Other Ambulatory Visit: Payer: Self-pay | Admitting: Gastroenterology

## 2013-03-20 NOTE — Telephone Encounter (Signed)
She really needs to get back down to once daily Dexilant. New RX given.

## 2013-03-21 ENCOUNTER — Other Ambulatory Visit: Payer: Self-pay | Admitting: Obstetrics & Gynecology

## 2013-03-27 ENCOUNTER — Other Ambulatory Visit: Payer: Self-pay | Admitting: Obstetrics & Gynecology

## 2013-04-11 NOTE — Progress Notes (Signed)
REVIEWED.  

## 2013-04-17 ENCOUNTER — Other Ambulatory Visit: Payer: Self-pay | Admitting: Obstetrics & Gynecology

## 2013-05-09 ENCOUNTER — Other Ambulatory Visit: Payer: Self-pay | Admitting: Obstetrics & Gynecology

## 2013-05-22 ENCOUNTER — Encounter: Payer: Self-pay | Admitting: Obstetrics & Gynecology

## 2013-05-22 ENCOUNTER — Ambulatory Visit (INDEPENDENT_AMBULATORY_CARE_PROVIDER_SITE_OTHER): Payer: Medicaid Other | Admitting: Obstetrics & Gynecology

## 2013-05-22 ENCOUNTER — Encounter (INDEPENDENT_AMBULATORY_CARE_PROVIDER_SITE_OTHER): Payer: Self-pay

## 2013-05-22 VITALS — BP 150/100 | Ht 63.0 in | Wt 133.0 lb

## 2013-05-22 DIAGNOSIS — Z01419 Encounter for gynecological examination (general) (routine) without abnormal findings: Secondary | ICD-10-CM

## 2013-05-22 DIAGNOSIS — N760 Acute vaginitis: Secondary | ICD-10-CM

## 2013-05-22 DIAGNOSIS — Z0000 Encounter for general adult medical examination without abnormal findings: Secondary | ICD-10-CM

## 2013-05-22 MED ORDER — METRONIDAZOLE 500 MG PO TABS: 500.0000 mg | ORAL_TABLET | Freq: Two times a day (BID) | ORAL | Status: DC

## 2013-05-22 NOTE — Progress Notes (Signed)
Patient ID: Destiny Manning, female   DOB: [birth year], 43 y.o.   MRN: [id number] Subjective:     Destiny Manning is a 43 y.o. female here for a routine exam.  Patient's last menstrual period was 07/04/2010. No obstetric history on file. Birth Control Method:  S/p vag hyst Menstrual Calendar(currently): na  Current complaints: none.   Current acute medical issues:  none   Recent Gynecologic History Patient's last menstrual period was 07/04/2010. Last Pap: ,   Last mammogram: needs one,    Past Medical History  Diagnosis Date  . GERD (gastroesophageal reflux disease)     takes Nexium Daily  . Hypertension   . CHF (congestive heart failure) 03/19/08    EF 35-40%  . Asthma dx 06/04/07    ventolin, singulair  . DCM (dilated cardiomyopathy) dx 06/06/07    EF 10-15%  . Seasonal allergies     takes zyrtec    Past Surgical History  Procedure Laterality Date  . Cholecystectomy  2001    laparoscopic, [facility]  . Cervical spine surgery  2002    [facility], ruptured disc  . Vaginal hysterectomy  10/19/2010    Procedure: HYSTERECTOMY VAGINAL;  Surgeon: [name], MD;  Location: [facility];  Service: Gynecology;  Laterality: N/A;  . Tubal ligation    . Esophagogastroduodenoscopy (egd) with esophageal dilation N/A 09/05/2012    ATF:TDDUK hiatal hernia s/p esophageal dilation with 73 F Maloney.     OB History   Grav Para Term Preterm Abortions TAB SAB Ect Mult Living                  History   Social History  . Marital Status: Single    Spouse Name: N/A    Number of Children: N/A  . Years of Education: N/A   Social History Main Topics  . Smoking status: Former Smoker    Quit date: 03/14/2009  . Smokeless tobacco: Never Used  . Alcohol Use: No  . Drug Use: No  . Sexual Activity: None   Other Topics Concern  . None   Social History Narrative  . None    Family History  Problem Relation Age of Onset  . Anesthesia problems Neg Hx   . Hypotension Neg Hx   . Pseudochol deficiency  Neg Hx   . Malignant hyperthermia Neg Hx   . Colon cancer Neg Hx      Review of Systems  Review of Systems  Constitutional: Negative for fever, chills, weight loss, malaise/fatigue and diaphoresis.  HENT: Negative for hearing loss, ear pain, nosebleeds, congestion, sore throat, neck pain, tinnitus and ear discharge.   Eyes: Negative for blurred vision, double vision, photophobia, pain, discharge and redness.  Respiratory: Negative for cough, hemoptysis, sputum production, shortness of breath, wheezing and stridor.   Cardiovascular: Negative for chest pain, palpitations, orthopnea, claudication, leg swelling and PND.  Gastrointestinal: negative for abdominal pain. Negative for heartburn, nausea, vomiting, diarrhea, constipation, blood in stool and melena.  Genitourinary: Negative for dysuria, urgency, frequency, hematuria and flank pain.  Musculoskeletal: Negative for myalgias, back pain, joint pain and falls.  Skin: Negative for itching and rash.  Neurological: Negative for dizziness, tingling, tremors, sensory change, speech change, focal weakness, seizures, loss of consciousness, weakness and headaches.  Endo/Heme/Allergies: Negative for environmental allergies and polydipsia. Does not bruise/bleed easily.  Psychiatric/Behavioral: Negative for depression, suicidal ideas, hallucinations, memory loss and substance abuse. The patient is not nervous/anxious and does not have insomnia.  Objective:    Physical Exam  Vitals reviewed. Constitutional: She is oriented to person, place, and time. She appears well-developed and well-nourished.  HENT:  Head: Normocephalic and atraumatic.        Right Ear: External ear normal.  Left Ear: External ear normal.  Nose: Nose normal.  Mouth/Throat: Oropharynx is clear and moist.  Eyes: Conjunctivae and EOM are normal. Pupils are equal, round, and reactive to light. Right eye exhibits no discharge. Left eye exhibits no discharge. No scleral  icterus.  Neck: Normal range of motion. Neck supple. No tracheal deviation present. No thyromegaly present.  Cardiovascular: Normal rate, regular rhythm, normal heart sounds and intact distal pulses.  Exam reveals no gallop and no friction rub.   No murmur heard. Respiratory: Effort normal and breath sounds normal. No respiratory distress. She has no wheezes. She has no rales. She exhibits no tenderness.  GI: Soft. Bowel sounds are normal. She exhibits no distension and no mass. There is no tenderness. There is no rebound and no guarding.  Genitourinary:  Breasts no masses skin changes or nipple changes bilaterally      Vulva is normal without lesions Vagina is pink moist with discharge wet prep +BV Cervix absent Uterus absent Adnexa is negative with normal sized ovaries    Musculoskeletal: Normal range of motion. She exhibits no edema and no tenderness.  Neurological: She is alert and oriented to person, place, and time. She has normal reflexes. She displays normal reflexes. No cranial nerve deficit. She exhibits normal muscle tone. Coordination normal.  Skin: Skin is warm and dry. No rash noted. No erythema. No pallor.  Psychiatric: She has a normal mood and affect. Her behavior is normal. Judgment and thought content normal.       Assessment:    Healthy female exam.    Plan:    Mammogram ordered. Follow up in: 1 year. metronidazole 500 bid

## 2014-03-17 ENCOUNTER — Other Ambulatory Visit: Payer: Self-pay | Admitting: Gastroenterology

## 2014-05-11 ENCOUNTER — Other Ambulatory Visit: Payer: Self-pay | Admitting: Gastroenterology

## 2014-05-25 ENCOUNTER — Other Ambulatory Visit: Payer: Medicaid Other | Admitting: Obstetrics & Gynecology

## 2014-06-03 ENCOUNTER — Other Ambulatory Visit: Payer: Medicaid Other | Admitting: Adult Health

## 2014-07-07 ENCOUNTER — Encounter: Payer: Self-pay | Admitting: Obstetrics & Gynecology

## 2014-07-07 ENCOUNTER — Ambulatory Visit (INDEPENDENT_AMBULATORY_CARE_PROVIDER_SITE_OTHER): Payer: Medicaid Other | Admitting: Obstetrics & Gynecology

## 2014-07-07 VITALS — BP 160/80 | HR 76 | Ht 63.0 in | Wt 146.0 lb

## 2014-07-07 DIAGNOSIS — N760 Acute vaginitis: Secondary | ICD-10-CM

## 2014-07-07 DIAGNOSIS — Z0000 Encounter for general adult medical examination without abnormal findings: Secondary | ICD-10-CM

## 2014-07-07 DIAGNOSIS — A499 Bacterial infection, unspecified: Secondary | ICD-10-CM | POA: Diagnosis not present

## 2014-07-07 DIAGNOSIS — B9689 Other specified bacterial agents as the cause of diseases classified elsewhere: Secondary | ICD-10-CM

## 2014-07-07 DIAGNOSIS — Z01419 Encounter for gynecological examination (general) (routine) without abnormal findings: Secondary | ICD-10-CM

## 2014-07-07 DIAGNOSIS — R1032 Left lower quadrant pain: Secondary | ICD-10-CM

## 2014-07-07 MED ORDER — METRONIDAZOLE 500 MG PO TABS: 500.0000 mg | ORAL_TABLET | Freq: Two times a day (BID) | ORAL | Status: DC

## 2014-07-07 NOTE — Progress Notes (Signed)
Patient ID: Destiny Manning, female   DOB: 1970-11-29, 44 y.o.   MRN: [id number] Subjective:     Destiny Manning is a 44 y.o. female here for a routine exam.  Patient's last menstrual period was 07/04/2010. No obstetric history on file. Birth Control Method:  Na hysterectomy 2012 Menstrual Calendar(currently): na  Current complaints: LLQ pain x 1 week.   Current acute medical issues:  none   Recent Gynecologic History Patient's last menstrual period was 07/04/2010. Last Pap: 2012,  normal Last mammogram: never,    Past Medical History  Diagnosis Date  . GERD (gastroesophageal reflux disease)     takes Nexium Daily  . Hypertension   . CHF (congestive heart failure) 03/19/08    EF 35-40%  . Asthma dx 06/04/07    ventolin, singulair  . DCM (dilated cardiomyopathy) dx 06/06/07    EF 10-15%  . Seasonal allergies     takes zyrtec    Past Surgical History  Procedure Laterality Date  . Cholecystectomy  2001    laparoscopic, [facility]  . Cervical spine surgery  2002    [facility], ruptured disc  . Vaginal hysterectomy  10/19/2010    Procedure: HYSTERECTOMY VAGINAL;  Surgeon: [name], MD;  Location: [facility];  Service: Gynecology;  Laterality: N/A;  . Tubal ligation    . Esophagogastroduodenoscopy (egd) with esophageal dilation N/A 09/05/2012    LOV:FIEPP hiatal hernia s/p esophageal dilation with 3 F Maloney.     OB History    No data available      History   Social History  . Marital Status: Single    Spouse Name: N/A  . Number of Children: N/A  . Years of Education: N/A   Social History Main Topics  . Smoking status: Former Smoker    Quit date: 03/14/2009  . Smokeless tobacco: Never Used  . Alcohol Use: No  . Drug Use: No  . Sexual Activity: Not on file   Other Topics Concern  . None   Social History Narrative    Family History  Problem Relation Age of Onset  . Anesthesia problems Neg Hx   . Hypotension Neg Hx   . Pseudochol deficiency Neg Hx   . Malignant  hyperthermia Neg Hx   . Colon cancer Neg Hx      Current outpatient prescriptions:  .  carvedilol (COREG) 25 MG tablet, Take 25 mg by mouth 2 (two) times daily with a meal. , Disp: , Rfl:  .  DEXILANT 60 MG capsule, TAKE ONE CAPSULE BY MOUTH ONCE DAILY., Disp: 30 capsule, Rfl: 5 .  digoxin (LANOXIN) 0.125 MG tablet, Take 125 mcg by mouth daily. , Disp: , Rfl:  .  fluticasone (FLONASE) 50 MCG/ACT nasal spray, Place 1 spray into the nose daily.  , Disp: , Rfl:  .  furosemide (LASIX) 20 MG tablet, Take 20 mg by mouth 2 (two) times daily. , Disp: , Rfl:  .  LINZESS 290 MCG CAPS capsule, TAKE ONE CAPSULE BY MOUTH DAILY., Disp: 30 capsule, Rfl: 11 .  montelukast (SINGULAIR) 10 MG tablet, Take 10 mg by mouth at bedtime., Disp: , Rfl:  .  ondansetron (ZOFRAN ODT) 4 MG disintegrating tablet, Take 1 tablet (4 mg total) by mouth every 8 (eight) hours as needed for nausea., Disp: 12 tablet, Rfl: 0 .  ramipril (ALTACE) 10 MG capsule, Take 10 mg by mouth daily. , Disp: , Rfl:  .  spironolactone (ALDACTONE) 12.5 mg TABS, Take 12.5 mg by  mouth 2 (two) times daily. , Disp: , Rfl:  .  busPIRone (BUSPAR) 15 MG tablet, Take 30 mg by mouth daily.  , Disp: , Rfl:  .  citalopram (CELEXA) 40 MG tablet, Take 40 mg by mouth at bedtime.  , Disp: , Rfl:  .  metroNIDAZOLE (FLAGYL) 500 MG tablet, Take 1 tablet (500 mg total) by mouth 2 (two) times daily. (Patient not taking: Reported on 07/07/2014), Disp: 14 tablet, Rfl: 0 .  OLANZapine (ZYPREXA) 10 MG tablet, Take 10 mg by mouth at bedtime., Disp: , Rfl:  .  potassium chloride (K-DUR) 10 MEQ tablet, Take 1 tablet (10 mEq total) by mouth 2 (two) times daily. (Patient not taking: Reported on 07/07/2014), Disp: 6 tablet, Rfl: 0  Review of Systems  Review of Systems  Constitutional: Negative for fever, chills, weight loss, malaise/fatigue and diaphoresis.  HENT: Negative for hearing loss, ear pain, nosebleeds, congestion, sore throat, neck pain, tinnitus and ear discharge.    Eyes: Negative for blurred vision, double vision, photophobia, pain, discharge and redness.  Respiratory: Negative for cough, hemoptysis, sputum production, shortness of breath, wheezing and stridor.   Cardiovascular: Negative for chest pain, palpitations, orthopnea, claudication, leg swelling and PND.  Gastrointestinal: negative for abdominal pain. Negative for heartburn, nausea, vomiting, diarrhea, constipation, blood in stool and melena.  Genitourinary: Negative for dysuria, urgency, frequency, hematuria and flank pain.  Musculoskeletal: Negative for myalgias, back pain, joint pain and falls.  Skin: Negative for itching and rash.  Neurological: Negative for dizziness, tingling, tremors, sensory change, speech change, focal weakness, seizures, loss of consciousness, weakness and headaches.  Endo/Heme/Allergies: Negative for environmental allergies and polydipsia. Does not bruise/bleed easily.  Psychiatric/Behavioral: Negative for depression, suicidal ideas, hallucinations, memory loss and substance abuse. The patient is not nervous/anxious and does not have insomnia.        Objective:  Blood pressure 160/80, pulse 76, height 5\' 3"  (1.6 m), weight 146 lb (66.225 kg), last menstrual period 07/04/2010.   Physical Exam  Vitals reviewed. Constitutional: She is oriented to person, place, and time. She appears well-developed and well-nourished.  HENT:  Head: Normocephalic and atraumatic.        Right Ear: External ear normal.  Left Ear: External ear normal.  Nose: Nose normal.  Mouth/Throat: Oropharynx is clear and moist.  Eyes: Conjunctivae and EOM are normal. Pupils are equal, round, and reactive to light. Right eye exhibits no discharge. Left eye exhibits no discharge. No scleral icterus.  Neck: Normal range of motion. Neck supple. No tracheal deviation present. No thyromegaly present.  Cardiovascular: Normal rate, regular rhythm, normal heart sounds and intact distal pulses.  Exam reveals  no gallop and no friction rub.   No murmur heard. Respiratory: Effort normal and breath sounds normal. No respiratory distress. She has no wheezes. She has no rales. She exhibits no tenderness.  GI: Soft. Bowel sounds are normal. She exhibits no distension and no mass. There is no tenderness. There is no rebound and no guarding.  Genitourinary:  Breasts no masses skin changes or nipple changes bilaterally      Vulva is normal without lesions Vagina is pink moist with heavy discharge  Wet Prep:   A sample of vaginal discharge was obtained from the posterior fornix using a cotton swab. 2 drops of saline were placed on a slide and the cotton swab was immersed in the saline. Microscopic evaluation was performed and results were as follows:  Negative  for yeast  Positive for clue cells ,  consistent with Bacterial vaginosis Negative for trichomonas  Normal WBC population   Whiff test: Positive  Cervix absent Uterus is absent Adnexa is negative with normal sized ovaries no fullness in the LLQ  Musculoskeletal: Normal range of motion. She exhibits no edema and no tenderness.  Neurological: She is alert and oriented to person, place, and time. She has normal reflexes. She displays normal reflexes. No cranial nerve deficit. She exhibits normal muscle tone. Coordination normal.  Skin: Skin is warm and dry. No rash noted. No erythema. No pallor.  Psychiatric: She has a normal mood and affect. Her behavior is normal. Judgment and thought content normal.       Assessment:    Healthy female exam.   BV LLQ pain Plan:    Follow up in: 2 weeks. Metronidazole 500 BID x 7 days for BV    Schedule sonogram in 2 weeks for evaluation LLQ pain

## 2014-07-20 ENCOUNTER — Other Ambulatory Visit: Payer: Self-pay | Admitting: Obstetrics & Gynecology

## 2014-07-20 DIAGNOSIS — R1032 Left lower quadrant pain: Secondary | ICD-10-CM

## 2014-07-21 ENCOUNTER — Ambulatory Visit (INDEPENDENT_AMBULATORY_CARE_PROVIDER_SITE_OTHER): Payer: Medicaid Other

## 2014-07-21 ENCOUNTER — Ambulatory Visit: Payer: Medicaid Other | Admitting: Obstetrics & Gynecology

## 2014-07-21 DIAGNOSIS — R1032 Left lower quadrant pain: Secondary | ICD-10-CM | POA: Diagnosis not present

## 2014-07-21 NOTE — Progress Notes (Signed)
US pelvis s/p hysterectomy 2012,normal vag cuff,normal ov's bilat,no free fluid seen

## 2014-07-23 ENCOUNTER — Ambulatory Visit (INDEPENDENT_AMBULATORY_CARE_PROVIDER_SITE_OTHER): Payer: Medicaid Other | Admitting: Obstetrics & Gynecology

## 2014-07-23 ENCOUNTER — Encounter: Payer: Self-pay | Admitting: Obstetrics & Gynecology

## 2014-07-23 VITALS — BP 180/80 | HR 100 | Ht 63.0 in | Wt 141.0 lb

## 2014-07-23 DIAGNOSIS — R1032 Left lower quadrant pain: Secondary | ICD-10-CM | POA: Diagnosis not present

## 2014-07-23 MED ORDER — NYSTATIN-TRIAMCINOLONE 100000-0.1 UNIT/GM-% EX OINT: 1.0000 "application " | TOPICAL_OINTMENT | Freq: Two times a day (BID) | CUTANEOUS | Status: DC

## 2014-07-23 NOTE — Progress Notes (Signed)
Patient ID: Destiny Manning, female   DOB: 01-13-1971, 44 y.o.   MRN: [id number]  Discussed sonogram report with patient  Questions were answered I viewed the images directly  Her pain is less severe and less frequent, may have been related to a walking issue  Will follow up prn US Transvaginal Non-ob  07/23/2014   GYNECOLOGIC SONOGRAM   Destiny Manning is a 44 y.o. s/p hysterectomy 2012,now here for a pelvic  sonogram for LLQ pain.  Vag Cuff                  wnl   Right ovary             2.6 x 2.7 x 1.8 cm, wnl  Left ovary                2.4 x 2.78 x 3.22 cm, wnl dominate follicle 1.0C  5.8N 2.7PO    Technician Comments:  US pelvis s/p hysterectomy 2012,normal vag cuff,normal ov's bilat,no free  fluid seen,lt ov dominate follicle 2.4M3.6x1.6cm    Silver Huguenin 07/21/2014 11:24 AM  Clinical Impression and recommendations:  I have reviewed the sonogram results above, combined with the patient's  current clinical course, below are my impressions and any appropriate  recommendations for management based on the sonographic findings.  Normal sonogram, physiological cyst left ovary, which I doubt is  responsible for the patient's pain   Luva Metzger H 07/23/2014 11:19 AM    US Pelvis Complete  07/23/2014   GYNECOLOGIC SONOGRAM   Destiny Manning is a 44 y.o. s/p hysterectomy 2012,now here for a pelvic  sonogram for LLQ pain.  Vag Cuff                  wnl   Right ovary             2.6 x 2.7 x 1.8 cm, wnl  Left ovary                2.4 x 2.78 x 3.22 cm, wnl dominate follicle 5.3I  1.4E 3.1VQ    Technician Comments:  US pelvis s/p hysterectomy 2012,normal vag cuff,normal ov's bilat,no free  fluid seen,lt ov dominate follicle 0.0Q6.6x1.6cm    Silver Huguenin 07/21/2014 11:24 AM  Clinical Impression and recommendations:  I have reviewed the sonogram results above, combined with the patient's  current clinical course, below are my impressions and any appropriate  recommendations for management based on the sonographic findings.   Normal sonogram, physiological cyst left ovary, which I doubt is  responsible for the patient's pain   Zair Borawski H 07/23/2014 11:19 AM        Face to face time:  10 minutes  Greater than 50% of the visit time was spent in counseling and coordination of care with the patient.  The summary and outline of the counseling and care coordination is summarized in the note above.   All questions were answered.

## 2014-12-28 ENCOUNTER — Telehealth: Payer: Self-pay

## 2014-12-28 NOTE — Telephone Encounter (Signed)
Pt has an office visit set up

## 2014-12-28 NOTE — Telephone Encounter (Signed)
We have not seen her in over 2 years. Would need office visit.

## 2014-12-28 NOTE — Telephone Encounter (Signed)
Pt is calling to see if she can she have something different then the [place] because her insurance is making her have a PA for it. She goes to [employer]. Please advise

## 2015-01-18 ENCOUNTER — Ambulatory Visit: Payer: Medicaid Other | Admitting: Gastroenterology

## 2015-02-05 ENCOUNTER — Encounter: Payer: Self-pay | Admitting: Gastroenterology

## 2015-02-05 ENCOUNTER — Ambulatory Visit (INDEPENDENT_AMBULATORY_CARE_PROVIDER_SITE_OTHER): Payer: Medicaid Other | Admitting: Gastroenterology

## 2015-02-05 VITALS — BP 147/96 | HR 107 | Temp 99.2°F | Ht 63.0 in | Wt 139.6 lb

## 2015-02-05 DIAGNOSIS — K219 Gastro-esophageal reflux disease without esophagitis: Secondary | ICD-10-CM | POA: Diagnosis not present

## 2015-02-05 DIAGNOSIS — K5900 Constipation, unspecified: Secondary | ICD-10-CM | POA: Diagnosis not present

## 2015-02-05 MED ORDER — LUBIPROSTONE 24 MCG PO CAPS: 24.0000 ug | ORAL_CAPSULE | Freq: Two times a day (BID) | ORAL | Status: DC

## 2015-02-05 MED ORDER — DEXLANSOPRAZOLE 60 MG PO CPDR: DELAYED_RELEASE_CAPSULE | ORAL | Status: DC

## 2015-02-05 NOTE — Progress Notes (Signed)
Primary Care Physician: [facility]  Primary Gastroenterologist:  Garfield Cornea, MD   Chief Complaint  Patient presents with  . Medication Management  . Constipation    HPI: Destiny Manning is a 44 y.o. female here for follow-up of GERD and constipation. She was last seen in June 2014. Was doing well until unable to get Dexilant and Linzess. Dexilant not covered per patient. She has Medicaid and likely needs prior authorization. Has been on OTC acid reducers without relief. Complains of heartburn. No vomiting or dysphagia. Some upper abdominal pain. Recently took prednisone and has been on naprosyn for neck/back pain. Noted constipation few weeks ago. Started after started naprosyn. Went a week without BM, took Fleet enema yesterday. Out of Linzess for one week. Had been going well before this. No melena, brbpr.      Current Outpatient Prescriptions  Medication Sig Dispense Refill  . carvedilol (COREG) 25 MG tablet Take 25 mg by mouth 2 (two) times daily with a meal.     . cyclobenzaprine (FLEXERIL) 10 MG tablet Take 10 mg by mouth 3 (three) times daily as needed for muscle spasms.    . DEXILANT 60 MG capsule TAKE ONE CAPSULE BY MOUTH ONCE DAILY. 30 capsule 5  . digoxin (LANOXIN) 0.125 MG tablet Take 125 mcg by mouth daily.     . fluticasone (FLONASE) 50 MCG/ACT nasal spray Place 1 spray into the nose daily.      . furosemide (LASIX) 20 MG tablet Take 20 mg by mouth 2 (two) times daily.     Marland Kitchen LINZESS 290 MCG CAPS capsule TAKE ONE CAPSULE BY MOUTH DAILY. 30 capsule 11  . montelukast (SINGULAIR) 10 MG tablet Take 10 mg by mouth at bedtime.    . naproxen (NAPROSYN) 375 MG tablet Take 375 mg by mouth 2 (two) times daily as needed.    . ramipril (ALTACE) 10 MG capsule Take 10 mg by mouth daily.     Marland Kitchen spironolactone (ALDACTONE) 12.5 mg TABS Take 12.5 mg by mouth 2 (two) times daily.      No current facility-administered medications for this visit.     Allergies as of 02/05/2015  . (No Known Allergies)    ROS:  General: Negative for anorexia, weight loss, fever, chills, fatigue, weakness. ENT: Negative for hoarseness, difficulty swallowing , nasal congestion. CV: Negative for chest pain, angina, palpitations, dyspnea on exertion, peripheral edema.  Respiratory: Negative for dyspnea at rest, dyspnea on exertion, cough, sputum, wheezing.  GI: See history of present illness. GU:  Negative for dysuria, hematuria, urinary incontinence, urinary frequency, nocturnal urination.  Endo: Negative for unusual weight change.    Physical Examination:   BP 147/96 mmHg  Pulse 107  Temp(Src) 99.2 F (37.3 C) (Oral)  Ht 5\' 3"  (1.6 m)  Wt 139 lb 9.6 oz (63.322 kg)  BMI 24.74 kg/m2  LMP 07/04/2010  General: Well-nourished, well-developed in no acute distress.  Eyes: No icterus. Mouth: Oropharyngeal mucosa moist and pink , no lesions erythema or exudate. Lungs: Clear to auscultation bilaterally.  Heart: Regular rate and rhythm, no murmurs rubs or gallops.  Abdomen: Bowel sounds are normal, mild epigastric tenderness, nondistended, no hepatosplenomegaly or masses, no abdominal bruits or hernia , no rebound or guarding.   Extremities: No lower extremity edema. No clubbing or deformities. Neuro: Alert and oriented x 4   Skin: Warm and dry, no jaundice.   Psych: Alert and cooperative, normal mood and affect.

## 2015-02-05 NOTE — Progress Notes (Signed)
CC'ED TO PCP 

## 2015-02-05 NOTE — Assessment & Plan Note (Signed)
Trial of Amitiza 24 g twice a day with food.

## 2015-02-05 NOTE — Patient Instructions (Signed)
1. Continue Dexilant one daily before breakfast.  2. Stop Linzess. Begin Amitiza 55mcg twice daily with food for constipation.  3. Call if you have ongoing problems with heartburn, abdominal pain or constipation.  4. Return to the office in one year.

## 2015-02-05 NOTE — Assessment & Plan Note (Signed)
Restart Dexilant 60mg  daily. Samples provided while we work on insurance issues. If symptoms do not settle down with medication, she will let us know. Likewise regarding constipation. Otherwise we will see her back in one year.

## 2015-12-08 ENCOUNTER — Encounter: Payer: Self-pay | Admitting: Internal Medicine

## 2016-02-14 ENCOUNTER — Other Ambulatory Visit: Payer: Medicaid Other | Admitting: Obstetrics & Gynecology

## 2016-03-06 ENCOUNTER — Encounter: Payer: Self-pay | Admitting: Adult Health

## 2016-03-06 ENCOUNTER — Encounter (INDEPENDENT_AMBULATORY_CARE_PROVIDER_SITE_OTHER): Payer: Self-pay

## 2016-03-06 ENCOUNTER — Ambulatory Visit (INDEPENDENT_AMBULATORY_CARE_PROVIDER_SITE_OTHER): Payer: Medicaid Other | Admitting: Adult Health

## 2016-03-06 VITALS — BP 144/80 | HR 88 | Ht 63.5 in | Wt 138.0 lb

## 2016-03-06 DIAGNOSIS — Z1211 Encounter for screening for malignant neoplasm of colon: Secondary | ICD-10-CM | POA: Diagnosis not present

## 2016-03-06 DIAGNOSIS — Z0000 Encounter for general adult medical examination without abnormal findings: Secondary | ICD-10-CM | POA: Diagnosis not present

## 2016-03-06 DIAGNOSIS — Z01419 Encounter for gynecological examination (general) (routine) without abnormal findings: Secondary | ICD-10-CM | POA: Diagnosis not present

## 2016-03-06 LAB — HEMOCCULT GUIAC POC 1CARD (OFFICE): FECAL OCCULT BLD: NEGATIVE

## 2016-03-06 NOTE — Progress Notes (Signed)
Patient ID: Destiny Manning, female   DOB: [birth year], 45 y.o.   MRN: McDonald:[id number] History of Present Illness: Destiny Manning is a 45 year old black female in for well woman gyn exam, she is sp hysterectomy. PCP is [facility] and cardiologist is Dr Carolynn Sayers in [place].She had mammogram on mobile unit at [facility], and labs every 3 months.   Current Medications, Allergies, Past Medical History, Past Surgical History, Family History and Social History were reviewed in [employer] record.     Review of Systems: Patient denies any headaches, hearing loss, fatigue, blurred vision, shortness of breath, chest pain, abdominal pain, problems with bowel movements, urination, or intercourse. No joint pain or mood swings.    Physical Exam:BP (!) 144/80 (BP Location: Left Arm, Patient Position: Sitting, Cuff Size: Normal)   Pulse 88   Ht 5' 3.5" (1.613 m)   Wt 138 lb (62.6 kg)   LMP 07/04/2010   BMI 24.06 kg/m  General:  Well developed, well nourished, no acute distress Skin:  Warm and dry Neck:  Midline trachea, normal thyroid, good ROM, no lymphadenopathy Lungs; Clear to auscultation bilaterally Breast:  No dominant palpable mass, retraction, or nipple discharge Cardiovascular: Regular rate and rhythm Abdomen:  Soft, non tender, no hepatosplenomegaly Pelvic:  External genitalia is normal in appearance, no lesions.  The vagina is normal in appearance. Urethra has no lesions or masses. The cervix and uterus are absent. No adnexal masses or tenderness noted.Bladder is non tender, no masses felt. Rectal: Good sphincter tone, no polyps, or hemorrhoids felt.  Hemoccult negative. Extremities/musculoskeletal:  No swelling or varicosities noted, no clubbing or cyanosis Psych:  No mood changes, alert and cooperative,seems happy PHQ 2 score 0.  Impression:  1. Well woman exam with routine gynecological exam      Plan:  Physical in 1 year Mammogram yearly Labs with PCP

## 2016-03-06 NOTE — Patient Instructions (Addendum)
Physical in 1 year Mammogram yearly Labs with PCP 

## 2016-03-21 ENCOUNTER — Encounter: Payer: Self-pay | Admitting: Gastroenterology

## 2016-03-21 ENCOUNTER — Ambulatory Visit (INDEPENDENT_AMBULATORY_CARE_PROVIDER_SITE_OTHER): Payer: Medicaid Other | Admitting: Gastroenterology

## 2016-03-21 VITALS — BP 154/90 | HR 96 | Temp 97.9°F | Ht 63.5 in | Wt 135.8 lb

## 2016-03-21 DIAGNOSIS — K5900 Constipation, unspecified: Secondary | ICD-10-CM

## 2016-03-21 DIAGNOSIS — K219 Gastro-esophageal reflux disease without esophagitis: Secondary | ICD-10-CM

## 2016-03-21 MED ORDER — LUBIPROSTONE 24 MCG PO CAPS: 24.0000 ug | ORAL_CAPSULE | Freq: Two times a day (BID) | ORAL | 3 refills | Status: DC

## 2016-03-21 MED ORDER — DEXLANSOPRAZOLE 60 MG PO CPDR: DELAYED_RELEASE_CAPSULE | ORAL | 3 refills | Status: DC

## 2016-03-21 NOTE — Assessment & Plan Note (Addendum)
Continue Amitiza 24 mcg po BID with food. Return in 1 year. Screening colonoscopy at age 45.

## 2016-03-21 NOTE — Progress Notes (Signed)
Referring Provider: [facility] Primary Care Physician:  Abran Richard, MD  Primary GI: Dr. Gala Romney   Chief Complaint  Patient presents with  . Medication Refill    HPI:   Destiny Manning is a 45 y.o. female presenting today with a history of GERD and constipation, last seen Oct 2016. Here for routine 1 year follow-up. Previously on Dexilant and Amitiza 24 mcg with food BID.   Taking Dexilant once daily, which is doing well. No dysphagia. Has been out of [place] for awhile so she feels "off" with her appetite.   Amitiza 24 mcg BID works well for constipation. Last colonoscopy in [place] at least 10 years ago and was normal.     Past Medical History:  Diagnosis Date  . Asthma dx 06/04/07   ventolin, singulair  . CHF (congestive heart failure) ([place]) 03/19/08   EF 35-40%  . DCM (dilated cardiomyopathy) ([place]) dx 06/06/07   EF 10-15%  . GERD (gastroesophageal reflux disease)    takes Nexium Daily  . Hypertension   . Seasonal allergies    takes zyrtec    Past Surgical History:  Procedure Laterality Date  . CERVICAL SPINE SURGERY  2002   [facility], ruptured disc  . CHOLECYSTECTOMY  2001   laparoscopic, [facility]  . ESOPHAGOGASTRODUODENOSCOPY (EGD) WITH ESOPHAGEAL DILATION N/A 09/05/2012   [id number] hiatal hernia s/p esophageal dilation with 38 F Maloney.   . TUBAL LIGATION    . VAGINAL HYSTERECTOMY  10/19/2010   Procedure: HYSTERECTOMY VAGINAL;  Surgeon: [name], MD;  Location: [facility];  Service: Gynecology;  Laterality: N/A;    Current Outpatient Prescriptions  Medication Sig Dispense Refill  . carvedilol (COREG) 25 MG tablet Take 25 mg by mouth 2 (two) times daily with a meal.     . Cetirizine HCl (ZYRTEC PO) Take by mouth daily.    Marland Kitchen dexlansoprazole (DEXILANT) 60 MG capsule TAKE ONE CAPSULE BY MOUTH ONCE DAILY. 30 capsule 11  . digoxin (LANOXIN) 0.125 MG tablet Take 125 mcg by mouth daily.     . fluticasone (FLONASE) 50 MCG/ACT nasal spray Place 1 spray into  the nose daily.      . furosemide (LASIX) 20 MG tablet Take 20 mg by mouth 2 (two) times daily.     Marland Kitchen lubiprostone (AMITIZA) 24 MCG capsule Take 24 mcg by mouth 2 (two) times daily with a meal.    . ramipril (ALTACE) 10 MG capsule Take 10 mg by mouth daily.     Marland Kitchen spironolactone (ALDACTONE) 12.5 mg TABS Take 12.5 mg by mouth 2 (two) times daily.      No current facility-administered medications for this visit.     Allergies as of 03/21/2016  . (No Known Allergies)    Family History  Problem Relation Age of Onset  . Congestive Heart Failure Father   . Dementia Maternal Grandmother   . Congestive Heart Failure Paternal Grandmother   . Congestive Heart Failure Paternal Grandfather   . Anesthesia problems Neg Hx   . Hypotension Neg Hx   . Pseudochol deficiency Neg Hx   . Malignant hyperthermia Neg Hx   . Colon cancer Neg Hx   . Colon polyps Neg Hx     Social History   Social History  . Marital status: Single    Spouse name: N/A  . Number of children: N/A  . Years of education: N/A   Social History Main Topics  . Smoking status: Former Smoker  Types: Cigarettes    Quit date: 03/14/2009  . Smokeless tobacco: Never Used  . Alcohol use No  . Drug use: No  . Sexual activity: Not Currently    Birth control/ protection: Surgical     Comment: hyst   Other Topics Concern  . Not on file   Social History Narrative  . No narrative on file    Review of Systems: Negative unless mentioned in HPI.   Physical Exam: BP (!) 154/90   Pulse 96   Temp 97.9 F (36.6 C) (Oral)   Ht 5' 3.5" (1.613 m)   Wt 135 lb 12.8 oz (61.6 kg)   LMP 07/04/2010   BMI 23.68 kg/m  General:   Alert and oriented. No distress noted. Pleasant and cooperative.  Head:  Normocephalic and atraumatic. Eyes:  Conjuctiva clear without scleral icterus. Mouth:  Oral mucosa pink and moist. Good dentition. No lesions. Abdomen:  +BS, soft, non-tender and non-distended. No rebound or guarding. No HSM or  masses noted. Msk:  Symmetrical without gross deformities. Normal posture. Extremities:  Without edema. Neurologic:  Alert and  oriented x4 Psych:  Alert and cooperative. Normal mood and affect.

## 2016-03-21 NOTE — Assessment & Plan Note (Signed)
Continue Dexilant once daily. No alarm features. Return in 1 year.

## 2016-03-21 NOTE — Patient Instructions (Addendum)
Continue Amitiza twice a day with food.   Continue Dexilant once daily. You may take Zantac as needed in the evenings.  We will see you back in 1 year.    Food Choices for Gastroesophageal Reflux Disease, Adult When you have gastroesophageal reflux disease (GERD), the foods you eat and your eating habits are very important. Choosing the right foods can help ease your discomfort. What guidelines do I need to follow?  Choose fruits, vegetables, whole grains, and low-fat dairy products.  Choose low-fat meat, fish, and poultry.  Limit fats such as oils, salad dressings, butter, nuts, and avocado.  Keep a food diary. This helps you identify foods that cause symptoms.  Avoid foods that cause symptoms. These may be different for everyone.  Eat small meals often instead of 3 large meals a day.  Eat your meals slowly, in a place where you are relaxed.  Limit fried foods.  Cook foods using methods other than frying.  Avoid drinking alcohol.  Avoid drinking large amounts of liquids with your meals.  Avoid bending over or lying down until 2-3 hours after eating. What foods are not recommended? These are some foods and drinks that may make your symptoms worse: Vegetables  Tomatoes. Tomato juice. Tomato and spaghetti sauce. Chili peppers. Onion and garlic. Horseradish. Fruits  Oranges, grapefruit, and lemon (fruit and juice). Meats  High-fat meats, fish, and poultry. This includes hot dogs, ribs, ham, sausage, salami, and bacon. Dairy  Whole milk and chocolate milk. Sour cream. Cream. Butter. Ice cream. Cream cheese. Drinks  Coffee and tea. Bubbly (carbonated) drinks or energy drinks. Condiments  Hot sauce. Barbecue sauce. Sweets/Desserts  Chocolate and cocoa. Donuts. Peppermint and spearmint. Fats and Oils  High-fat foods. This includes [place] fries and potato chips. Other  Vinegar. Strong spices. This includes black pepper, white pepper, red pepper, cayenne, curry powder,  cloves, ginger, and chili powder. The items listed above may not be a complete list of foods and drinks to avoid. Contact your dietitian for more information.  This information is not intended to replace advice given to you by your health care provider. Make sure you discuss any questions you have with your health care provider. Document Released: 09/26/2011 Document Revised: 09/02/2015 Document Reviewed: 01/29/2013 Elsevier Interactive Patient Education  2017 [employer].

## 2016-03-21 NOTE — Progress Notes (Signed)
CC'ED TO PCP 

## 2016-06-01 DIAGNOSIS — E785 Hyperlipidemia, unspecified: Secondary | ICD-10-CM | POA: Diagnosis not present

## 2016-06-01 DIAGNOSIS — I10 Essential (primary) hypertension: Secondary | ICD-10-CM | POA: Diagnosis not present

## 2016-06-01 DIAGNOSIS — I509 Heart failure, unspecified: Secondary | ICD-10-CM | POA: Diagnosis not present

## 2016-06-01 DIAGNOSIS — I2510 Atherosclerotic heart disease of native coronary artery without angina pectoris: Secondary | ICD-10-CM | POA: Diagnosis not present

## 2016-06-01 DIAGNOSIS — R0602 Shortness of breath: Secondary | ICD-10-CM | POA: Diagnosis not present

## 2016-06-01 DIAGNOSIS — R0600 Dyspnea, unspecified: Secondary | ICD-10-CM | POA: Diagnosis not present

## 2016-06-15 DIAGNOSIS — M4692 Unspecified inflammatory spondylopathy, cervical region: Secondary | ICD-10-CM | POA: Diagnosis not present

## 2016-09-14 ENCOUNTER — Encounter: Payer: Self-pay | Admitting: Adult Health

## 2016-09-14 ENCOUNTER — Ambulatory Visit (INDEPENDENT_AMBULATORY_CARE_PROVIDER_SITE_OTHER): Payer: Medicaid Other | Admitting: Adult Health

## 2016-09-14 VITALS — BP 132/80 | HR 82 | Ht 63.0 in | Wt 125.5 lb

## 2016-09-14 DIAGNOSIS — N898 Other specified noninflammatory disorders of vagina: Secondary | ICD-10-CM | POA: Diagnosis not present

## 2016-09-14 DIAGNOSIS — B9689 Other specified bacterial agents as the cause of diseases classified elsewhere: Secondary | ICD-10-CM

## 2016-09-14 DIAGNOSIS — N760 Acute vaginitis: Secondary | ICD-10-CM

## 2016-09-14 DIAGNOSIS — L298 Other pruritus: Secondary | ICD-10-CM | POA: Diagnosis not present

## 2016-09-14 LAB — POCT WET PREP (WET MOUNT): CLUE CELLS WET PREP WHIFF POC: NEGATIVE

## 2016-09-14 MED ORDER — METRONIDAZOLE 0.75 % VA GEL: 1.0000 | Freq: Every day | VAGINAL | 0 refills | Status: DC

## 2016-09-14 MED ORDER — NYSTATIN-TRIAMCINOLONE 100000-0.1 UNIT/GM-% EX OINT: 1.0000 "application " | TOPICAL_OINTMENT | Freq: Two times a day (BID) | CUTANEOUS | 0 refills | Status: DC

## 2016-09-14 NOTE — Progress Notes (Signed)
Subjective:     Patient ID: Destiny Manning, female   DOB: 08/13/1970, 46 y.o.   MRN: [id number]  HPI Destiny Manning is a 46 year old black female in complaining of vaginal itching, has not noticed discharge.   Review of Systems Vaginal itching  Reviewed past medical,surgical, social and family history. Reviewed medications and allergies.     Objective:   Physical Exam BP 132/80 (BP Location: Left Arm, Patient Position: Sitting, Cuff Size: Normal)   Pulse 82   Ht 5\' 3"  (1.6 m)   Wt 125 lb 8 oz (56.9 kg)   LMP 07/04/2010   BMI 22.23 kg/m  Skin warm and dry.Pelvic: external genitalia is normal in appearance no lesions, vagina: white discharge without odor,urethra has no lesions or masses noted, cervix and uterus are absent, adnexa: no masses or tenderness noted. Bladder is non tender and no masses felt. Wet prep: + for clue cells and +WBCs.     Assessment:     1. Vaginal itching   2. Vaginal discharge   3. BV (bacterial vaginosis)       Plan:     Meds ordered this encounter  Medications  . metroNIDAZOLE (METROGEL VAGINAL) 0.75 % vaginal gel    Sig: Place 1 Applicatorful vaginally at bedtime.    Dispense:  70 g    Refill:  0    Order Specific Question:   Supervising Provider    Answer:   EURE, LUTHER H [[id number]]  . nystatin-triamcinolone ointment (MYCOLOG)    Sig: Apply 1 application topically 2 (two) times daily.    Dispense:  30 g    Refill:  0    Order Specific Question:   Supervising Provider    Answer:   Tania Ade H [[id number]]  Follow up prn

## 2016-11-16 ENCOUNTER — Ambulatory Visit (INDEPENDENT_AMBULATORY_CARE_PROVIDER_SITE_OTHER): Payer: Medicaid Other | Admitting: Family Medicine

## 2016-11-16 ENCOUNTER — Encounter: Payer: Self-pay | Admitting: Family Medicine

## 2016-11-16 VITALS — BP 168/98 | HR 84 | Temp 97.7°F | Resp 16 | Ht 64.0 in | Wt 132.0 lb

## 2016-11-16 DIAGNOSIS — J4530 Mild persistent asthma, uncomplicated: Secondary | ICD-10-CM | POA: Diagnosis not present

## 2016-11-16 DIAGNOSIS — Z9889 Other specified postprocedural states: Secondary | ICD-10-CM | POA: Diagnosis not present

## 2016-11-16 DIAGNOSIS — M47816 Spondylosis without myelopathy or radiculopathy, lumbar region: Secondary | ICD-10-CM | POA: Diagnosis not present

## 2016-11-16 DIAGNOSIS — J301 Allergic rhinitis due to pollen: Secondary | ICD-10-CM | POA: Diagnosis not present

## 2016-11-16 DIAGNOSIS — Z1231 Encounter for screening mammogram for malignant neoplasm of breast: Secondary | ICD-10-CM | POA: Diagnosis not present

## 2016-11-16 DIAGNOSIS — J302 Other seasonal allergic rhinitis: Secondary | ICD-10-CM | POA: Insufficient documentation

## 2016-11-16 DIAGNOSIS — O903 Peripartum cardiomyopathy: Secondary | ICD-10-CM | POA: Diagnosis not present

## 2016-11-16 DIAGNOSIS — G8929 Other chronic pain: Secondary | ICD-10-CM | POA: Insufficient documentation

## 2016-11-16 DIAGNOSIS — I10 Essential (primary) hypertension: Secondary | ICD-10-CM

## 2016-11-16 DIAGNOSIS — M545 Low back pain, unspecified: Secondary | ICD-10-CM | POA: Insufficient documentation

## 2016-11-16 DIAGNOSIS — Z1239 Encounter for other screening for malignant neoplasm of breast: Secondary | ICD-10-CM

## 2016-11-16 HISTORY — DX: Spondylosis without myelopathy or radiculopathy, lumbar region: M47.816

## 2016-11-16 MED ORDER — BACLOFEN 10 MG PO TABS: 10.0000 mg | ORAL_TABLET | Freq: Three times a day (TID) | ORAL | 0 refills | Status: DC

## 2016-11-16 NOTE — Progress Notes (Signed)
Chief Complaint  Patient presents with  . Hypertension   New to office Had cardiomyopathy associated with her second pregnancy 9 years ago and remains under the care of a cardiologist on medication.  Initially her EF was as low as 15 %, now she says it is 45-50%.  She considers herself disabled from working. She has hypertension - did not take her medicine yet today She has back and neck pain and requests a refill of her baclofen.  Had cervical disc surgery 20 years ago after a fall and injury.  Also today complains of bilateral knee pain.  Awakens her at night.   She has well controlled asthma.  Seasonal allergies. Sees gastroenterology for GERD, chronic constipation.  Due for mammogram Does not need PAPs Refuses immunizations/flu shots.  This is discussed and strongly encouraged  Patient Active Problem List   Diagnosis Date Noted  . 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 Hypertension 11/16/2016  . Seasonal allergies 11/16/2016  . Asthma in adult, mild persistent, uncomplicated 56/25/6389  . DJD (degenerative joint disease), lumbar 11/16/2016  . H/O cervical spine surgery 11/16/2016  . GERD (gastroesophageal reflux disease) 08/13/2012  . Dysphagia, unspecified(787.20) 08/13/2012  . Unspecified constipation 07/11/2012    Outpatient Encounter Prescriptions as of 11/16/2016  Medication Sig  . carvedilol (COREG) 25 MG tablet Take 25 mg by mouth 2 (two) times daily with a meal.   . Cetirizine HCl (ZYRTEC PO) Take by mouth daily.  Marland Kitchen dexlansoprazole (DEXILANT) 60 MG capsule TAKE ONE CAPSULE BY MOUTH ONCE DAILY.  . digoxin (LANOXIN) 0.125 MG tablet Take 125 mcg by mouth daily.   . fluticasone (FLONASE) 50 MCG/ACT nasal spray Place 1 spray into the nose daily.    . Fluticasone-Salmeterol (ADVAIR) 250-50 MCG/DOSE AEPB Inhale 1 puff into the lungs 2 (two) times daily.  . furosemide (LASIX) 20 MG tablet Take 20 mg by mouth 2 (two) times daily.   Marland Kitchen lubiprostone (AMITIZA) 24 MCG  capsule Take 1 capsule (24 mcg total) by mouth 2 (two) times daily with a meal.  . ramipril (ALTACE) 10 MG capsule Take 10 mg by mouth daily.   Marland Kitchen spironolactone (ALDACTONE) 12.5 mg TABS Take 12.5 mg by mouth 2 (two) times daily.   . baclofen (LIORESAL) 10 MG tablet Take 1 tablet (10 mg total) by mouth 3 (three) times daily.   No facility-administered encounter medications on file as of 11/16/2016.     Past Medical History:  Diagnosis Date  . Asthma dx 06/04/07   ventolin, singulair  . Blood transfusion without reported diagnosis   . CHF (congestive heart failure) ([place]) 03/19/08   EF 35-40%  . DCM (dilated cardiomyopathy) ([place]) dx 06/06/07   EF 10-15%  . Degeneration of spine   . DJD (degenerative joint disease), lumbar 11/16/2016  . GERD (gastroesophageal reflux disease)    takes Nexium Daily  . Hypertension   . Seasonal allergies    takes zyrtec    Past Surgical History:  Procedure Laterality Date  . ABDOMINAL HYSTERECTOMY     fibroids  . CERVICAL SPINE SURGERY  2002   [facility], ruptured disc  . CHOLECYSTECTOMY  2001   laparoscopic, [facility]  . ESOPHAGOGASTRODUODENOSCOPY (EGD) WITH ESOPHAGEAL DILATION N/A 09/05/2012   [id number] hiatal hernia s/p esophageal dilation with 23 F Maloney.   Marland Kitchen SPINE SURGERY    . TUBAL LIGATION    . VAGINAL HYSTERECTOMY  10/19/2010   Procedure: HYSTERECTOMY VAGINAL;  Surgeon: [name], MD;  Location: [facility];  Service: Gynecology;  Laterality: N/A;    Social History   Social History  . Marital status: Single    Spouse name: N/A  . Number of children: 2  . Years of education: 16   Occupational History  . disabled    Social History Main Topics  . Smoking status: Former Smoker    Types: Cigarettes    Quit date: 03/14/2009  . Smokeless tobacco: Never Used  . Alcohol use No  . Drug use: No  . Sexual activity: Yes    Birth control/ protection: Surgical     Comment: hyst   Other Topics Concern  . Not on file   Social History Narrative    College degree data entry   Currently disabled   Lives with 2 children    Family History  Problem Relation Age of Onset  . Diabetes Mother   . Hypertension Mother   . Congestive Heart Failure Father 78  . Arthritis Father   . Dementia Maternal Grandmother   . Congestive Heart Failure Paternal Grandmother   . Congestive Heart Failure Paternal Grandfather   . Cancer Maternal Uncle   . Anesthesia problems Neg Hx   . Hypotension Neg Hx   . Pseudochol deficiency Neg Hx   . Malignant hyperthermia Neg Hx   . Colon cancer Neg Hx   . Colon polyps Neg Hx     Review of Systems  Constitutional: Negative for chills, fever and weight loss.  HENT: Negative for congestion and hearing loss.   Eyes: Negative for blurred vision and pain.  Respiratory: Positive for shortness of breath. Negative for cough.   Cardiovascular: Negative for chest pain and leg swelling.  Gastrointestinal: Negative for abdominal pain, constipation, diarrhea and heartburn.  Genitourinary: Negative for dysuria and frequency.  Musculoskeletal: Positive for back pain, joint pain and neck pain. Negative for falls and myalgias.  Neurological: Negative for dizziness, seizures and headaches.  Psychiatric/Behavioral: Negative for depression. The patient is not nervous/anxious and does not have insomnia.     BP (!) 168/98 (BP Location: Right Arm, Patient Position: Sitting, Cuff Size: Normal)   Pulse 84   Temp 97.7 F (36.5 C) (Temporal)   Resp 16   Ht 5\' 4"  (1.626 m)   Wt 132 lb 0.6 oz (59.9 kg)   LMP 07/04/2010   SpO2 100%   BMI 22.66 kg/m   Physical Exam  Constitutional: She is oriented to person, place, and time. She appears well-developed and well-nourished.  HENT:  Head: Normocephalic and atraumatic.  Mouth/Throat: Oropharynx is clear and moist.  Eyes: Pupils are equal, round, and reactive to light. Conjunctivae are normal.  Neck: Normal range of motion. Neck supple. No thyromegaly present.  Cardiovascular:  Normal rate, regular rhythm and normal heart sounds.   Pulmonary/Chest: Effort normal and breath sounds normal. No respiratory distress.  Abdominal: Soft. Bowel sounds are normal.  Musculoskeletal: Normal range of motion. She exhibits no edema.  Knees examined.  Other than mild patellofemoral crepitus, nothing abnormal.  Lymphadenopathy:    She has no cervical adenopathy.  Neurological: She is alert and oriented to person, place, and time. She displays normal reflexes.  Gait normal  Skin: Skin is warm and dry.  Psychiatric: She has a normal mood and affect. Her behavior is normal. Thought content normal.  Nursing note and vitals reviewed. ASSESSMENT/PLAN:   1. 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 Essential hypertension Not compliant - didn't take meds  3. Seasonal allergic rhinitis due  to pollen controlled  4. Asthma in adult, mild persistent, uncomplicated controlled  5. Osteoarthritis of lumbar spine, unspecified spinal osteoarthritis complication status Complaint of chronic back pain  6. H/O cervical spine surgery    Patient Instructions  Need old records from cardiologist in [place] ([place]) Need record from PCP [place] current medicines  See me in 2-3 months   Raylene Everts, MD

## 2016-11-16 NOTE — Patient Instructions (Signed)
Need old records from cardiologist in [place] ([place]) Need record from PCP [place] current medicines  See me in 2-3 months

## 2016-12-08 ENCOUNTER — Ambulatory Visit (HOSPITAL_COMMUNITY): Payer: Medicaid Other

## 2016-12-18 ENCOUNTER — Other Ambulatory Visit: Payer: Self-pay | Admitting: Family Medicine

## 2016-12-18 DIAGNOSIS — Z1231 Encounter for screening mammogram for malignant neoplasm of breast: Secondary | ICD-10-CM

## 2016-12-25 ENCOUNTER — Ambulatory Visit (HOSPITAL_COMMUNITY): Payer: Self-pay

## 2017-02-01 DIAGNOSIS — I509 Heart failure, unspecified: Secondary | ICD-10-CM | POA: Diagnosis not present

## 2017-02-01 DIAGNOSIS — R0602 Shortness of breath: Secondary | ICD-10-CM | POA: Diagnosis not present

## 2017-02-01 DIAGNOSIS — I2510 Atherosclerotic heart disease of native coronary artery without angina pectoris: Secondary | ICD-10-CM | POA: Diagnosis not present

## 2017-02-01 DIAGNOSIS — I10 Essential (primary) hypertension: Secondary | ICD-10-CM | POA: Diagnosis not present

## 2017-02-01 DIAGNOSIS — R0600 Dyspnea, unspecified: Secondary | ICD-10-CM | POA: Diagnosis not present

## 2017-02-12 ENCOUNTER — Telehealth: Payer: Self-pay | Admitting: *Deleted

## 2017-02-12 MED ORDER — FLUTICASONE PROPIONATE 50 MCG/ACT NA SUSP: 1.0000 | Freq: Every day | NASAL | 3 refills | Status: DC

## 2017-02-12 NOTE — Telephone Encounter (Signed)
Patient called to get a refill on her flonase sent to [place]. Please advise

## 2017-02-12 NOTE — Telephone Encounter (Signed)
Seen [phone] 

## 2017-02-16 ENCOUNTER — Ambulatory Visit: Payer: Medicaid Other | Admitting: Family Medicine

## 2017-02-19 ENCOUNTER — Encounter: Payer: Self-pay | Admitting: Family Medicine

## 2017-02-19 ENCOUNTER — Other Ambulatory Visit: Payer: Self-pay

## 2017-02-19 ENCOUNTER — Ambulatory Visit (INDEPENDENT_AMBULATORY_CARE_PROVIDER_SITE_OTHER): Payer: Medicaid Other | Admitting: Family Medicine

## 2017-02-19 VITALS — BP 144/96 | HR 80 | Temp 97.6°F | Resp 16 | Ht 64.0 in | Wt 135.0 lb

## 2017-02-19 DIAGNOSIS — I10 Essential (primary) hypertension: Secondary | ICD-10-CM

## 2017-02-19 DIAGNOSIS — O903 Peripartum cardiomyopathy: Secondary | ICD-10-CM

## 2017-02-19 DIAGNOSIS — Z23 Encounter for immunization: Secondary | ICD-10-CM | POA: Diagnosis not present

## 2017-02-19 DIAGNOSIS — M47816 Spondylosis without myelopathy or radiculopathy, lumbar region: Secondary | ICD-10-CM | POA: Diagnosis not present

## 2017-02-19 MED ORDER — GABAPENTIN 300 MG PO CAPS: 300.0000 mg | ORAL_CAPSULE | Freq: Three times a day (TID) | ORAL | 3 refills | Status: DC

## 2017-02-19 NOTE — Patient Instructions (Signed)
Need records from the spine doctors  Take the gabapentin for pain Take one a day at night, as tolerated increase to two a day then three a day  Need new records from recent cardiology visit  See me in a month

## 2017-02-19 NOTE — Progress Notes (Signed)
Chief Complaint  Patient presents with  . Follow-up    3 month  Patient is here for follow-up.  She states that she went for a cardiology follow-up a couple weeks back.  She states that her cardiologist tell her her cardiomyopathy is doing well.  Her blood pressure is not well controlled.  She did not take her blood pressure medication this morning.  The importance of daily medical compliance is emphasized to her. Patient has DJD in her low back and neck.  She tells me these have been more painful for her recently.  The pain in her low back goes down her left leg.  We discussed pain management.  Anti-inflammatories, only limited fashion are safe.  She has a muscle relaxer.  She has been through physical therapy.  She should do exercises at home.  Ice may help.  I will start her on gabapentin to see whether this helps her.  If not she may need to go back to her spine specialist. Her GERD is well controlled. Her constipation is managed. Only current complaint is of low back and leg pain. Patient Active Problem List   Diagnosis Date Noted  . 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 Hypertension 11/16/2016  . Seasonal allergies 11/16/2016  . Asthma in adult, mild persistent, uncomplicated 17/61/6073  . DJD (degenerative joint disease), lumbar 11/16/2016  . H/O cervical spine surgery 11/16/2016  . GERD (gastroesophageal reflux disease) 08/13/2012  . Dysphagia, unspecified(787.20) 08/13/2012  . Unspecified constipation 07/11/2012    Outpatient Encounter Medications as of 02/19/2017  Medication Sig  . baclofen (LIORESAL) 10 MG tablet Take 1 tablet (10 mg total) by mouth 3 (three) times daily.  . carvedilol (COREG) 25 MG tablet Take 25 mg by mouth 2 (two) times daily with a meal.   . Cetirizine HCl (ZYRTEC PO) Take by mouth daily.  Marland Kitchen dexlansoprazole (DEXILANT) 60 MG capsule TAKE ONE CAPSULE BY MOUTH ONCE DAILY.  . digoxin (LANOXIN) 0.125 MG tablet Take 125 mcg by mouth daily.   .  fluticasone (FLONASE) 50 MCG/ACT nasal spray Place 1 spray daily into both nostrils.  . Fluticasone-Salmeterol (ADVAIR) 250-50 MCG/DOSE AEPB Inhale 1 puff into the lungs 2 (two) times daily.  . furosemide (LASIX) 20 MG tablet Take 20 mg by mouth 2 (two) times daily.   Marland Kitchen lubiprostone (AMITIZA) 24 MCG capsule Take 1 capsule (24 mcg total) by mouth 2 (two) times daily with a meal.  . ramipril (ALTACE) 10 MG capsule Take 10 mg by mouth daily.   Marland Kitchen spironolactone (ALDACTONE) 12.5 mg TABS Take 12.5 mg by mouth 2 (two) times daily.   Marland Kitchen gabapentin (NEURONTIN) 300 MG capsule Take 1 capsule (300 mg total) 3 (three) times daily by mouth.   No facility-administered encounter medications on file as of 02/19/2017.     No Known Allergies  Review of Systems  Constitutional: Negative for activity change, appetite change and unexpected weight change.  HENT: Negative for congestion, dental problem, postnasal drip and rhinorrhea.   Eyes: Negative for redness and visual disturbance.  Respiratory: Negative for cough and shortness of breath.   Cardiovascular: Negative for chest pain, palpitations and leg swelling.  Gastrointestinal: Negative for abdominal pain, constipation and diarrhea.  Genitourinary: Negative for difficulty urinating and frequency.  Musculoskeletal: Positive for back pain, gait problem and neck stiffness. Negative for arthralgias.  Neurological: Negative for dizziness, light-headedness and headaches.  Psychiatric/Behavioral: Negative for dysphoric mood and sleep disturbance. The patient is not nervous/anxious.  BP (!) 144/96 (BP Location: Left Arm, Patient Position: Sitting, Cuff Size: Normal)   Pulse 80   Temp 97.6 F (36.4 C) (Temporal)   Resp 16   Ht 5\' 4"  (1.626 m)   Wt 135 lb 0.6 oz (61.3 kg)   LMP 07/04/2010   SpO2 98%   BMI 23.18 kg/m   Physical Exam  Constitutional: She is oriented to person, place, and time. She appears well-developed and well-nourished.  Mildly  uncomfortable.  Sits with left leg extended.  HENT:  Head: Normocephalic and atraumatic.  Mouth/Throat: Oropharynx is clear and moist.  Eyes: Conjunctivae are normal. Pupils are equal, round, and reactive to light.  Neck: Normal range of motion. Neck supple. No thyromegaly present.  Cardiovascular: Normal rate, regular rhythm and normal heart sounds.  Pulmonary/Chest: Effort normal and breath sounds normal. No respiratory distress.  Abdominal: Soft. Bowel sounds are normal.  Musculoskeletal: Normal range of motion. She exhibits no edema.  Mild tenderness lumbar spine in the muscles.  No tenderness of the bones.  Strength sensation range of motion and reflexes are symmetric in both lower extremities  Lymphadenopathy:    She has no cervical adenopathy.  Neurological: She is alert and oriented to person, place, and time. She displays normal reflexes.  Gait normal  Skin: Skin is warm and dry.  Psychiatric: She has a normal mood and affect. Her behavior is normal. Thought content normal.  Nursing note and vitals reviewed.   ASSESSMENT/PLAN:  1. Essential hypertension Not well controlled.  Not compliant with medication.  2. Osteoarthritis of lumbar spine, unspecified spinal osteoarthritis complication status Long-standing history of back pain with leg pain  3. 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  Need records  4. Need for influenza vaccination Done today. - Flu Vaccine QUAD 36+ mos IM   Patient Instructions  Need records from the spine doctors  Take the gabapentin for pain Take one a day at night, as tolerated increase to two a day then three a day  Need new records from recent cardiology visit  See me in a month     Raylene Everts, MD

## 2017-02-21 ENCOUNTER — Encounter: Payer: Self-pay | Admitting: Family Medicine

## 2017-02-21 DIAGNOSIS — I2510 Atherosclerotic heart disease of native coronary artery without angina pectoris: Secondary | ICD-10-CM | POA: Insufficient documentation

## 2017-02-22 ENCOUNTER — Encounter: Payer: Self-pay | Admitting: Internal Medicine

## 2017-02-28 ENCOUNTER — Other Ambulatory Visit: Payer: Self-pay

## 2017-02-28 ENCOUNTER — Encounter: Payer: Self-pay | Admitting: Family Medicine

## 2017-02-28 ENCOUNTER — Ambulatory Visit: Payer: Medicaid Other | Admitting: Family Medicine

## 2017-02-28 VITALS — BP 136/84 | HR 80 | Temp 98.9°F | Resp 18 | Ht 64.0 in | Wt 141.0 lb

## 2017-02-28 DIAGNOSIS — J069 Acute upper respiratory infection, unspecified: Secondary | ICD-10-CM

## 2017-02-28 DIAGNOSIS — B9789 Other viral agents as the cause of diseases classified elsewhere: Secondary | ICD-10-CM

## 2017-02-28 DIAGNOSIS — J4530 Mild persistent asthma, uncomplicated: Secondary | ICD-10-CM

## 2017-02-28 MED ORDER — BENZONATATE 100 MG PO CAPS: 100.0000 mg | ORAL_CAPSULE | Freq: Two times a day (BID) | ORAL | 0 refills | Status: DC | PRN

## 2017-02-28 NOTE — Progress Notes (Signed)
Chief Complaint  Patient presents with  . URI    since sunday   Patient is here for cough and cold.  Both of her children have viruses.  They have been to their doctor.  She has runny and stuffy nose, sore throat, ear pressure and pain.  The fatigue.  No fever or chills.  No nausea or vomiting.  Cough.  No sputum.  She is using her inhalers.  She has increased use of albuterol.  Patient Active Problem List   Diagnosis Date Noted  . CAD in native artery 02/21/2017  . 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 Hypertension 11/16/2016  . Seasonal allergies 11/16/2016  . Asthma in adult, mild persistent, uncomplicated 16/01/9603  . DJD (degenerative joint disease), lumbar 11/16/2016  . H/O cervical spine surgery 11/16/2016  . GERD (gastroesophageal reflux disease) 08/13/2012  . Dysphagia, unspecified(787.20) 08/13/2012  . Unspecified constipation 07/11/2012    Outpatient Encounter Medications as of 02/28/2017  Medication Sig  . baclofen (LIORESAL) 10 MG tablet Take 1 tablet (10 mg total) by mouth 3 (three) times daily.  . carvedilol (COREG) 25 MG tablet Take 25 mg by mouth 2 (two) times daily with a meal.   . Cetirizine HCl (ZYRTEC PO) Take by mouth daily.  Marland Kitchen dexlansoprazole (DEXILANT) 60 MG capsule TAKE ONE CAPSULE BY MOUTH ONCE DAILY.  . digoxin (LANOXIN) 0.125 MG tablet Take 125 mcg by mouth daily.   . fluticasone (FLONASE) 50 MCG/ACT nasal spray Place 1 spray daily into both nostrils.  . Fluticasone-Salmeterol (ADVAIR) 250-50 MCG/DOSE AEPB Inhale 1 puff into the lungs 2 (two) times daily.  . furosemide (LASIX) 20 MG tablet Take 20 mg by mouth 2 (two) times daily.   Marland Kitchen gabapentin (NEURONTIN) 300 MG capsule Take 1 capsule (300 mg total) 3 (three) times daily by mouth.  . lubiprostone (AMITIZA) 24 MCG capsule Take 1 capsule (24 mcg total) by mouth 2 (two) times daily with a meal.  . ramipril (ALTACE) 10 MG capsule Take 10 mg by mouth daily.   Marland Kitchen spironolactone (ALDACTONE) 12.5  mg TABS Take 12.5 mg by mouth 2 (two) times daily.   . benzonatate (TESSALON) 100 MG capsule Take 1 capsule (100 mg total) by mouth 2 (two) times daily as needed for cough.   No facility-administered encounter medications on file as of 02/28/2017.     No Known Allergies  Review of Systems  Constitutional: Negative for activity change, appetite change, chills and fever.  HENT: Positive for congestion, ear pain, postnasal drip, rhinorrhea and sinus pain.   Eyes: Negative for redness and visual disturbance.  Respiratory: Positive for cough, shortness of breath and wheezing.   Cardiovascular: Negative for chest pain and palpitations.  Gastrointestinal: Negative for diarrhea, nausea and vomiting.  Musculoskeletal: Negative for neck pain and neck stiffness.  Neurological: Negative for dizziness and headaches.  Psychiatric/Behavioral: Positive for sleep disturbance.    BP 136/84 (BP Location: Right Arm, Patient Position: Sitting, Cuff Size: Normal)   Pulse 80   Temp 98.9 F (37.2 C) (Temporal)   Resp 18   Ht 5\' 4"  (1.626 m)   Wt 141 lb (64 kg)   LMP 07/04/2010   SpO2 100%   BMI 24.20 kg/m   Physical Exam  Constitutional: She is oriented to person, place, and time. She appears well-developed and well-nourished. No distress.  Mildly ill  HENT:  Head: Normocephalic and atraumatic.  Right Ear: External ear normal.  Left Ear: External ear normal.  Mouth/Throat: Oropharynx is  clear and moist.  Clear rhinorrhea  Eyes: Conjunctivae are normal. Pupils are equal, round, and reactive to light.  Neck: Normal range of motion.  Cardiovascular: Normal rate, regular rhythm and normal heart sounds.  Pulmonary/Chest: Effort normal and breath sounds normal. She has no wheezes. She has no rales.  Musculoskeletal: Normal range of motion. She exhibits no edema.  Lymphadenopathy:    She has cervical adenopathy.  Neurological: She is alert and oriented to person, place, and time.  Psychiatric: She  has a normal mood and affect. Her behavior is normal.    ASSESSMENT/PLAN:  1. Asthma in adult, mild persistent, uncomplicated Controlled  2. Viral URI with cough Discussed viral bronchitis, viral respiratory infections.  No need for antibiotics.  Symptomatic care.  Push fluids.  Call if not better by next week.  Watch for signs of bacterial infection such as purulent sputum, high fever, persistent symptoms   Patient Instructions  Use tessalon for the coughing Continue flonase for the nasal congestion push fluids Rest Call if not better by next week   Raylene Everts, MD

## 2017-02-28 NOTE — Patient Instructions (Signed)
Use tessalon for the coughing Continue flonase for the nasal congestion push fluids Rest Call if not better by next week

## 2017-03-16 ENCOUNTER — Other Ambulatory Visit: Payer: Self-pay | Admitting: Gastroenterology

## 2017-03-21 ENCOUNTER — Ambulatory Visit: Payer: Medicaid Other | Admitting: Family Medicine

## 2017-03-23 ENCOUNTER — Ambulatory Visit: Payer: Medicaid Other | Admitting: Gastroenterology

## 2017-04-05 ENCOUNTER — Other Ambulatory Visit: Payer: Self-pay

## 2017-04-05 ENCOUNTER — Ambulatory Visit (INDEPENDENT_AMBULATORY_CARE_PROVIDER_SITE_OTHER): Payer: Medicaid Other | Admitting: Family Medicine

## 2017-04-05 ENCOUNTER — Encounter: Payer: Self-pay | Admitting: Family Medicine

## 2017-04-05 VITALS — BP 130/80 | HR 76 | Temp 98.7°F | Resp 16 | Ht 64.0 in | Wt 143.1 lb

## 2017-04-05 DIAGNOSIS — Z91199 Patient's noncompliance with other medical treatment and regimen due to unspecified reason: Secondary | ICD-10-CM | POA: Insufficient documentation

## 2017-04-05 DIAGNOSIS — J4530 Mild persistent asthma, uncomplicated: Secondary | ICD-10-CM | POA: Diagnosis not present

## 2017-04-05 DIAGNOSIS — I10 Essential (primary) hypertension: Secondary | ICD-10-CM

## 2017-04-05 DIAGNOSIS — Z9119 Patient's noncompliance with other medical treatment and regimen: Secondary | ICD-10-CM | POA: Diagnosis not present

## 2017-04-05 DIAGNOSIS — M47816 Spondylosis without myelopathy or radiculopathy, lumbar region: Secondary | ICD-10-CM

## 2017-04-05 DIAGNOSIS — O903 Peripartum cardiomyopathy: Secondary | ICD-10-CM | POA: Diagnosis not present

## 2017-04-05 MED ORDER — BACLOFEN 10 MG PO TABS: 10.0000 mg | ORAL_TABLET | Freq: Three times a day (TID) | ORAL | 1 refills | Status: DC

## 2017-04-05 NOTE — Patient Instructions (Signed)
Increase the gabapentin to 4 pills a day ( two at night) I have refilled the baclofen for as needed use for muscle tension Continue to stay as active as you can manage  (Still need orthopedic records)  See me in a couple of months Consider second opinion on pain management

## 2017-04-05 NOTE — Progress Notes (Signed)
Chief Complaint  Patient presents with  . Follow-up    1 month   Patient is here for routine follow-up.  She has run out of her baclofen and ask for refill.  She also states that the gabapentin helps her somewhat, but not as much as she had hoped.  I explained to her that she is on moderately low dose of gabapentin we can start to increase.  She is going to take gabapentin 4 times a day 1 in the morning, 1 at noon, and 2 at night.  She especially has trouble sleeping. She states that her neck pain and back pain bother her on a daily basis.  She tries to stretch and exercise.  She states that she has been to spine specialist and been to pain clinic and has been released to return as needed.  She feels her neck and back pain are severe enough to keep her from working.  I told her that if she still having that much pain, does not feel employable, she really should go back to the specialist for consultation.  In addition I will obtain a copy of these records for review. Her heart failure is stable.  Rarely does she complain of dyspnea.  No chest pain.  I do have copies of her cardiology notes.  She does not weigh herself daily. Patient blood pressure stable today.  She did take her blood pressure pill today.  She has not had blood pressure medication to have the last 3 visits.  Cardiology notes also mention occasional noncompliance.  Importance of taking her medicines daily as emphasized to her.  Patient Active Problem List   Diagnosis Date Noted  . Personal history of noncompliance with medical treatment 04/05/2017  . CAD in native artery 02/21/2017  . 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 Hypertension 11/16/2016  . Seasonal allergies 11/16/2016  . Asthma in adult, mild persistent, uncomplicated 16/01/9603  . DJD (degenerative joint disease), lumbar 11/16/2016  . H/O cervical spine surgery 11/16/2016  . GERD (gastroesophageal reflux disease) 08/13/2012  . Dysphagia,  unspecified(787.20) 08/13/2012  . Unspecified constipation 07/11/2012    Outpatient Encounter Medications as of 04/05/2017  Medication Sig  . baclofen (LIORESAL) 10 MG tablet Take 1 tablet (10 mg total) by mouth 3 (three) times daily.  . carvedilol (COREG) 25 MG tablet Take 25 mg by mouth 2 (two) times daily with a meal.   . Cetirizine HCl (ZYRTEC PO) Take by mouth daily.  Marland Kitchen dexlansoprazole (DEXILANT) 60 MG capsule TAKE (1) CAPSULE BY MOUTH ONCE DAILY.  . digoxin (LANOXIN) 0.125 MG tablet Take 125 mcg by mouth daily.   . fluticasone (FLONASE) 50 MCG/ACT nasal spray Place 1 spray daily into both nostrils.  . Fluticasone-Salmeterol (ADVAIR) 250-50 MCG/DOSE AEPB Inhale 1 puff into the lungs 2 (two) times daily.  . furosemide (LASIX) 20 MG tablet Take 20 mg by mouth 2 (two) times daily.   Marland Kitchen gabapentin (NEURONTIN) 300 MG capsule Take 1 capsule (300 mg total) 4 (three) times daily by mouth.  . lubiprostone (AMITIZA) 24 MCG capsule Take 1 capsule (24 mcg total) by mouth 2 (two) times daily with a meal.  . ramipril (ALTACE) 10 MG capsule Take 10 mg by mouth daily.   Marland Kitchen spironolactone (ALDACTONE) 12.5 mg TABS Take 12.5 mg by mouth 2 (two) times daily.    No facility-administered encounter medications on file as of 04/05/2017.     No Known Allergies  Review of Systems  Constitutional: Negative for  activity change, appetite change and unexpected weight change.  HENT: Negative for congestion, dental problem, postnasal drip and rhinorrhea.   Eyes: Negative for redness and visual disturbance.  Respiratory: Negative for cough and shortness of breath.   Cardiovascular: Negative for chest pain, palpitations and leg swelling.  Gastrointestinal: Negative for abdominal pain, constipation and diarrhea.  Genitourinary: Negative for difficulty urinating and frequency.  Musculoskeletal: Positive for back pain, gait problem and neck stiffness. Negative for arthralgias.  Neurological: Negative for dizziness,  light-headedness and headaches.  Psychiatric/Behavioral: Negative for dysphoric mood and sleep disturbance. The patient is not nervous/anxious.     BP 130/80 (BP Location: Left Arm, Patient Position: Sitting, Cuff Size: Normal)   Pulse 76   Temp 98.7 F (37.1 C) (Temporal)   Resp 16   Ht 5\' 4"  (1.626 m)   Wt 143 lb 1.9 oz (64.9 kg)   LMP 07/04/2010   SpO2 100%   BMI 24.57 kg/m   Physical Exam  Constitutional: She is oriented to person, place, and time. She appears well-developed and well-nourished.  No discomfort.  Sits, stands, and walks without apparent limitation  HENT:  Head: Normocephalic and atraumatic.  Mouth/Throat: Oropharynx is clear and moist.  Eyes: Conjunctivae are normal. Pupils are equal, round, and reactive to light.  Neck: Normal range of motion. Neck supple. No thyromegaly present.  Cardiovascular: Normal rate, regular rhythm and normal heart sounds.  Pulmonary/Chest: Effort normal and breath sounds normal. No respiratory distress.  No wheezing  Abdominal: Soft. Bowel sounds are normal.  Musculoskeletal: Normal range of motion. She exhibits no edema.  Mild tenderness lumbar spine in the muscles.  No tenderness of other structures.  Strength, sensation range of motion and reflexes are symmetric in both lower extremities.  Lymphadenopathy:    She has no cervical adenopathy.  Neurological: She is alert and oriented to person, place, and time. She displays normal reflexes.  Gait normal  Skin: Skin is warm and dry.  Psychiatric: She has a normal mood and affect. Her behavior is normal. Thought content normal.  Nursing note and vitals reviewed.   ASSESSMENT/PLAN:  1. Essential hypertension Controlled.  History of noncompliance.  Discussed importance of taking medication  2. Asthma in adult, mild persistent, uncomplicated Compliant with inhalers.  No wheezing today  3. Personal history of noncompliance with medical treatment Discussed with patient 4.   Cardiomyopathy Stable 5.  Chronic low back pain Advised that if patient is having enough pain to require daily medication, and limit her activities including employment she needs to go back to her pain specialist or spine specialist for care Patient Instructions  Increase the gabapentin to 4 pills a day ( two at night) I have refilled the baclofen for as needed use for muscle tension Continue to stay as active as you can manage  (Still need orthopedic records)  See me in a couple of months Consider second opinion on pain management   Raylene Everts, MD

## 2017-04-13 ENCOUNTER — Other Ambulatory Visit: Payer: Self-pay | Admitting: Gastroenterology

## 2017-04-16 ENCOUNTER — Telehealth: Payer: Self-pay | Admitting: Family Medicine

## 2017-04-16 NOTE — Telephone Encounter (Signed)
I recommended that she go back to her spine doctor for consult first.

## 2017-04-16 NOTE — Telephone Encounter (Signed)
Patient is requesting pain mgmnt referral to Dr.[place] Cb#" [phone]

## 2017-04-17 NOTE — Telephone Encounter (Signed)
Left message to call office

## 2017-04-18 ENCOUNTER — Telehealth: Payer: Self-pay | Admitting: Family Medicine

## 2017-04-18 NOTE — Telephone Encounter (Signed)
Spoke to [place], aware of dr [employer] advice.

## 2017-04-18 NOTE — Telephone Encounter (Signed)
Pt is calling back in ---returning your call [phone]

## 2017-04-18 NOTE — Telephone Encounter (Signed)
Pt aware.

## 2017-04-19 ENCOUNTER — Telehealth: Payer: Self-pay

## 2017-04-19 NOTE — Telephone Encounter (Signed)
PA started and approved 04/19/17 with [facility] track for Dexilant 60 mg. Approval 04/19/17-04/14/2017. PA approval # [id number]

## 2017-04-24 ENCOUNTER — Encounter: Payer: Self-pay | Admitting: Family Medicine

## 2017-04-24 ENCOUNTER — Ambulatory Visit (INDEPENDENT_AMBULATORY_CARE_PROVIDER_SITE_OTHER): Payer: Medicaid Other | Admitting: Family Medicine

## 2017-04-24 ENCOUNTER — Other Ambulatory Visit: Payer: Self-pay

## 2017-04-24 VITALS — BP 134/84 | HR 88 | Temp 98.5°F | Resp 18 | Ht 64.0 in | Wt 140.0 lb

## 2017-04-24 DIAGNOSIS — B9789 Other viral agents as the cause of diseases classified elsewhere: Secondary | ICD-10-CM

## 2017-04-24 DIAGNOSIS — J4541 Moderate persistent asthma with (acute) exacerbation: Secondary | ICD-10-CM | POA: Diagnosis not present

## 2017-04-24 DIAGNOSIS — J069 Acute upper respiratory infection, unspecified: Secondary | ICD-10-CM | POA: Diagnosis not present

## 2017-04-24 MED ORDER — PREDNISONE 20 MG PO TABS: 20.0000 mg | ORAL_TABLET | Freq: Two times a day (BID) | ORAL | 0 refills | Status: DC

## 2017-04-24 NOTE — Patient Instructions (Addendum)
Push fluids Take 5 days of prednisone Continue the flonase singulair and inhalers Tylenol or ibuprofen for fever Call if not better  By friday

## 2017-04-24 NOTE — Progress Notes (Signed)
Chief Complaint  Patient presents with  . Sinusitis   Cough and wheeze since yesterday with chills and runny stuffy nose.  Kids are sick. Compliant with singulair and advair and albuterol prn with flonase daily Does not know what else to take OTC Recommend Mucinex DM   Patient Active Problem List   Diagnosis Date Noted  . Personal history of noncompliance with medical treatment 04/05/2017  . CAD in native artery 02/21/2017  . 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 Hypertension 11/16/2016  . Seasonal allergies 11/16/2016  . Asthma in adult, mild persistent, uncomplicated 03/00/9233  . DJD (degenerative joint disease), lumbar 11/16/2016  . H/O cervical spine surgery 11/16/2016  . GERD (gastroesophageal reflux disease) 08/13/2012  . Dysphagia, unspecified(787.20) 08/13/2012  . Unspecified constipation 07/11/2012    Outpatient Encounter Medications as of 04/24/2017  Medication Sig  . AMITIZA 24 MCG capsule TAKE 1 CAPSULE TWICE DAILY WITH MEALS.  . baclofen (LIORESAL) 10 MG tablet Take 1 tablet (10 mg total) by mouth 3 (three) times daily.  . carvedilol (COREG) 25 MG tablet Take 25 mg by mouth 2 (two) times daily with a meal.   . Cetirizine HCl (ZYRTEC PO) Take by mouth daily.  Marland Kitchen dexlansoprazole (DEXILANT) 60 MG capsule TAKE (1) CAPSULE BY MOUTH ONCE DAILY.  . digoxin (LANOXIN) 0.125 MG tablet Take 125 mcg by mouth daily.   . fluticasone (FLONASE) 50 MCG/ACT nasal spray Place 1 spray daily into both nostrils.  . Fluticasone-Salmeterol (ADVAIR) 250-50 MCG/DOSE AEPB Inhale 1 puff into the lungs 2 (two) times daily.  . furosemide (LASIX) 20 MG tablet Take 20 mg by mouth 2 (two) times daily.   Marland Kitchen gabapentin (NEURONTIN) 300 MG capsule Take 1 capsule (300 mg total) 3 (three) times daily by mouth.  . montelukast (SINGULAIR) 10 MG tablet Take 10 mg by mouth at bedtime.  . ramipril (ALTACE) 10 MG capsule Take 10 mg by mouth daily.   Marland Kitchen spironolactone (ALDACTONE) 12.5 mg TABS Take  12.5 mg by mouth 2 (two) times daily.   . predniSONE (DELTASONE) 20 MG tablet Take 1 tablet (20 mg total) by mouth 2 (two) times daily with a meal.   No facility-administered encounter medications on file as of 04/24/2017.     No Known Allergies  Review of Systems  Constitutional: Positive for chills. Negative for activity change, appetite change, fever and unexpected weight change.  HENT: Positive for congestion, rhinorrhea and sore throat. Negative for dental problem.   Eyes: Negative for redness and visual disturbance.  Respiratory: Positive for cough and wheezing. Negative for shortness of breath.   Cardiovascular: Negative for chest pain, palpitations and leg swelling.  Gastrointestinal: Negative for abdominal pain, constipation and diarrhea.  Genitourinary: Negative for difficulty urinating, frequency and menstrual problem.  Musculoskeletal: Negative for arthralgias and back pain.  Neurological: Negative for dizziness and headaches.  Psychiatric/Behavioral: Negative for dysphoric mood and sleep disturbance. The patient is not nervous/anxious.     BP 134/84 (BP Location: Right Arm, Patient Position: Sitting, Cuff Size: Normal)   Pulse 88   Temp 98.5 F (36.9 C) (Temporal)   Resp 18   Ht 5\' 4"  (1.626 m)   Wt 140 lb 0.6 oz (63.5 kg)   LMP 07/04/2010   SpO2 99%   BMI 24.04 kg/m   Physical Exam  Constitutional: She is oriented to person, place, and time. She appears well-developed and well-nourished. No distress.  Mildly ill  HENT:  Head: Normocephalic and atraumatic.  Right Ear: External  ear normal.  Left Ear: External ear normal.  Mouth/Throat: Oropharynx is clear and moist.  Clear rhinorrhea  Eyes: Conjunctivae are normal. Pupils are equal, round, and reactive to light.  Neck: Normal range of motion.  Cardiovascular: Normal rate, regular rhythm and normal heart sounds.  Pulmonary/Chest: Effort normal. She has wheezes. She has no rales.  Scattered insp wheeze centrally   Musculoskeletal: Normal range of motion. She exhibits no edema.  Lymphadenopathy:    She has cervical adenopathy.  Neurological: She is alert and oriented to person, place, and time.  Psychiatric: She has a normal mood and affect. Her behavior is normal.    ASSESSMENT/PLAN:  1. Viral URI with cough One day of illness, a resp virus.  antibiotics not indicated.  2. Moderate persistent asthma with exacerbation With wheeze, will give 5 d prednisone   Patient Instructions  Push fluids Take 5 days of prednisone Continue the flonase singulair and inhalers Tylenol or ibuprofen for fever Call if not better  By friday    Raylene Everts, MD

## 2017-05-09 ENCOUNTER — Other Ambulatory Visit: Payer: Self-pay | Admitting: Gastroenterology

## 2017-05-23 ENCOUNTER — Encounter: Payer: Self-pay | Admitting: Gastroenterology

## 2017-05-23 ENCOUNTER — Ambulatory Visit: Payer: Medicaid Other | Admitting: Gastroenterology

## 2017-05-23 VITALS — BP 131/83 | HR 81 | Temp 98.8°F | Ht 63.0 in | Wt 148.6 lb

## 2017-05-23 DIAGNOSIS — R1310 Dysphagia, unspecified: Secondary | ICD-10-CM | POA: Diagnosis not present

## 2017-05-23 DIAGNOSIS — K219 Gastro-esophageal reflux disease without esophagitis: Secondary | ICD-10-CM | POA: Diagnosis not present

## 2017-05-23 DIAGNOSIS — R197 Diarrhea, unspecified: Secondary | ICD-10-CM | POA: Insufficient documentation

## 2017-05-23 MED ORDER — PROMETHAZINE HCL 25 MG PO TABS: 12.5000 mg | ORAL_TABLET | Freq: Four times a day (QID) | ORAL | 0 refills | Status: DC | PRN

## 2017-05-23 MED ORDER — PANTOPRAZOLE SODIUM 40 MG PO TBEC: 40.0000 mg | DELAYED_RELEASE_TABLET | Freq: Every day | ORAL | 3 refills | Status: DC

## 2017-05-23 MED ORDER — DICYCLOMINE HCL 10 MG PO CAPS: 10.0000 mg | ORAL_CAPSULE | Freq: Three times a day (TID) | ORAL | 3 refills | Status: DC

## 2017-05-23 NOTE — Patient Instructions (Signed)
I would like for you to stop Amitiza. If you still have loose stool, collect the stool studies and take to the lab. After that, if having loose stool, you can take Bentyl before meals and at bedtime as needed for cramping.  I have sent in Protonix to take instead of Dexilant once daily.  I sent in phenergan to take 1/2 tablet every 6 hours as needed for nausea, but you can increase this to 1 tablet if needed. Monitor for drowsiness, and don't drive while taking it.  We will be arranging a colonoscopy, upper endoscopy, and dilation in the near future!  I will see you in 3 months!  It was a pleasure to see you today. I strive to create trusting relationships with patients to provide genuine, compassionate, and quality care. I value your feedback. If you receive a survey regarding your visit,  I greatly appreciate you the taking time to fill this out.   Annitta Needs, PhD, ANP-BC [facility]

## 2017-05-23 NOTE — Progress Notes (Signed)
Primary Care Physician:  Raylene Everts, MD Primary GI: Dr. Gala Romney   Chief Complaint  Patient presents with  . Dysphagia    hurts  . Abdominal Pain    lower abd, radiates to back  . Diarrhea    2-3x/day, green  . Nausea    HPI:   Destiny Manning is a 47 y.o. female presenting today with a history of GERD and constipation, last seen in Dec 2017. Historically has been on Dexilant. Amitiza in past for constipation. Last colonoscopy in remote past, over 10 years ago in [place]. Needs initial screening colonoscopy now. Last EGD in 2014 with empiric dilation.    Diarrhea started around November/December. Color is green. Loose stool about 3 a day. Taking Amitiza once a day still. Was on antibiotics for sinus infection about a month ago. Pain located lower abdomen and wrapped around to lower back. Cramping with bowel movements. Feels better until time for next one.   Dysphagia: Notes recurrent dysphagia for at least 6 months. Takes Dexilant but has to do a PA for it, and she would like to change it to something else.   Had labs recently with cardiology, in [place], [place], at Cardiology clinic.   Past Medical History:  Diagnosis Date  . Asthma dx 06/04/07   ventolin, singulair  . Blood transfusion without reported diagnosis   . CHF (congestive heart failure) ([place]) 03/19/08   EF 35-40%  . DCM (dilated cardiomyopathy) ([place]) dx 06/06/07   EF 10-15%  . Degeneration of spine   . DJD (degenerative joint disease), lumbar 11/16/2016  . GERD (gastroesophageal reflux disease)    takes Nexium Daily  . Hypertension   . Seasonal allergies    takes zyrtec    Past Surgical History:  Procedure Laterality Date  . ABDOMINAL HYSTERECTOMY     fibroids  . CERVICAL SPINE SURGERY  2002   [facility], ruptured disc  . CHOLECYSTECTOMY  2001   laparoscopic, [facility]  . ESOPHAGOGASTRODUODENOSCOPY (EGD) WITH ESOPHAGEAL DILATION N/A 09/05/2012   YBO:[id number] hiatal hernia s/p esophageal dilation with 78 F  [facility].   Marland Kitchen SPINE SURGERY    . TUBAL LIGATION    . VAGINAL HYSTERECTOMY  10/19/2010   Procedure: HYSTERECTOMY VAGINAL;  Surgeon: [name], MD;  Location: [facility];  Service: Gynecology;  Laterality: N/A;    Current Outpatient Medications  Medication Sig Dispense Refill  . AMITIZA 24 MCG capsule TAKE 1 CAPSULE TWICE DAILY WITH MEALS. 60 capsule 11  . baclofen (LIORESAL) 10 MG tablet Take 1 tablet (10 mg total) by mouth 3 (three) times daily. 90 each 1  . carvedilol (COREG) 25 MG tablet Take 25 mg by mouth 2 (two) times daily with a meal.     . Cetirizine HCl (ZYRTEC PO) Take by mouth daily.    Marland Kitchen dexlansoprazole (DEXILANT) 60 MG capsule TAKE (1) CAPSULE BY MOUTH ONCE DAILY. 90 capsule 3  . digoxin (LANOXIN) 0.125 MG tablet Take 125 mcg by mouth daily.     . fluticasone (FLONASE) 50 MCG/ACT nasal spray Place 1 spray daily into both nostrils. 16 g 3  . Fluticasone-Salmeterol (ADVAIR) 250-50 MCG/DOSE AEPB Inhale 1 puff into the lungs 2 (two) times daily.    . furosemide (LASIX) 20 MG tablet Take 20 mg by mouth 2 (two) times daily.     Marland Kitchen gabapentin (NEURONTIN) 300 MG capsule Take 1 capsule (300 mg total) 3 (three) times daily by mouth. 90 capsule 3  . montelukast (SINGULAIR) 10 MG  tablet Take 10 mg by mouth at bedtime.    . predniSONE (DELTASONE) 20 MG tablet Take 1 tablet (20 mg total) by mouth 2 (two) times daily with a meal. 10 tablet 0  . ramipril (ALTACE) 10 MG capsule Take 10 mg by mouth daily.     Marland Kitchen spironolactone (ALDACTONE) 12.5 mg TABS Take 12.5 mg by mouth 2 (two) times daily.      No current facility-administered medications for this visit.     Allergies as of 05/23/2017  . (No Known Allergies)    Family History  Problem Relation Age of Onset  . Diabetes Mother   . Hypertension Mother   . Congestive Heart Failure Father 67  . Arthritis Father   . Dementia Maternal Grandmother   . Congestive Heart Failure Paternal Grandmother   . Congestive Heart Failure Paternal  Grandfather   . Cancer Maternal Uncle   . Anesthesia problems Neg Hx   . Hypotension Neg Hx   . Pseudochol deficiency Neg Hx   . Malignant hyperthermia Neg Hx   . Colon cancer Neg Hx   . Colon polyps Neg Hx     Social History   Socioeconomic History  . Marital status: Single    Spouse name: None  . Number of children: 2  . Years of education: 64  . Highest education level: None  Social Needs  . Financial resource strain: None  . Food insecurity - worry: None  . Food insecurity - inability: None  . Transportation needs - medical: None  . Transportation needs - non-medical: None  Occupational History  . Occupation: disabled  Tobacco Use  . Smoking status: Former Smoker    Types: Cigarettes    Last attempt to quit: 03/14/2009    Years since quitting: 8.1  . Smokeless tobacco: Never Used  Substance and Sexual Activity  . Alcohol use: No  . Drug use: No  . Sexual activity: Yes    Birth control/protection: Surgical    Comment: hyst  Other Topics Concern  . None  Social History Narrative   College degree data entry   Currently denied disability, patient feels unable to work   Lives with 2 children    Review of Systems: Gen: Denies fever, chills, anorexia. Denies fatigue, weakness, weight loss.  CV: Denies chest pain, palpitations, syncope, peripheral edema, and claudication. Resp: Denies dyspnea at rest, cough, wheezing, coughing up blood, and pleurisy. GI: see HPI  Derm: Denies rash, itching, dry skin Psych: Denies depression, anxiety, memory loss, confusion. No homicidal or suicidal ideation.  Heme: Denies bruising, bleeding, and enlarged lymph nodes.  Physical Exam: BP 131/83   Pulse 81   Temp 98.8 F (37.1 C) (Oral)   Ht 5\' 3"  (1.6 m)   Wt 148 lb 9.6 oz (67.4 kg)   LMP 07/04/2010   BMI 26.32 kg/m  General:   Alert and oriented. No distress noted. Pleasant and cooperative.  Head:  Normocephalic and atraumatic. Eyes:  Conjuctiva clear without scleral  icterus. Mouth:  Oral mucosa pink and moist.  Abdomen:  +BS, soft, non-tender and non-distended. No rebound or guarding. No HSM or masses noted. Msk:  Symmetrical without gross deformities. Normal posture. Extremities:  Without edema. Neurologic:  Alert and  oriented x4 Psych:  Alert and cooperative. Normal mood and affect.

## 2017-05-27 NOTE — Assessment & Plan Note (Signed)
Recurrent dysphagia for the past 6 months. History of EGD in 2014 with empiric dilation. Would benefit from repeat.   Change from [place] to Protonix, as she wants to avoid needing prior authorizations each time.  Proceed with upper endoscopy/dilation in the near future with Dr. Gala Romney. The risks, benefits, and alternatives have been discussed in detail with patient. They have stated understanding and desire to proceed.  Propofol due to polypharmacy

## 2017-05-27 NOTE — Assessment & Plan Note (Addendum)
47 year old female with loose stool starting around November/December, but she is still taking Amitiza. Will have her stop this. If loose stool continues, collect stool studies. No other alarm features. If negative stool studies and persistent diarrhea, would trial Bentyl. Needs initial screening colonoscopy this year. Will ensure no evidence of infectious process, then pursue colonoscopy.   Stop Amitiza If persistent diarrhea, stool studies ordered. Bentyl prn if needed Proceed with TCS with Dr. Gala Romney in near future once current issues addressed: the risks, benefits, and alternatives have been discussed with the patient in detail. The patient states understanding and desires to proceed. Propofol due to polypharmacy  ADDENDUM on 3/6: diarrhea resolved. Now with constipation. Resume Amitiza.

## 2017-05-28 NOTE — Progress Notes (Signed)
cc'ed to pcp °

## 2017-06-04 ENCOUNTER — Encounter: Payer: Self-pay | Admitting: *Deleted

## 2017-06-04 ENCOUNTER — Telehealth: Payer: Self-pay | Admitting: *Deleted

## 2017-06-04 ENCOUNTER — Ambulatory Visit: Payer: Medicaid Other | Admitting: Family Medicine

## 2017-06-04 NOTE — Telephone Encounter (Signed)
Patient advised at last OV on 05/23/17 she would call back to schedule tcs/egd/dil w/mac w/RMR. We have not heard back from pt. Letter mailed.

## 2017-06-05 ENCOUNTER — Encounter: Payer: Self-pay | Admitting: *Deleted

## 2017-06-05 ENCOUNTER — Other Ambulatory Visit: Payer: Self-pay | Admitting: *Deleted

## 2017-06-05 ENCOUNTER — Telehealth: Payer: Self-pay | Admitting: Gastroenterology

## 2017-06-05 DIAGNOSIS — R1310 Dysphagia, unspecified: Secondary | ICD-10-CM

## 2017-06-05 DIAGNOSIS — R197 Diarrhea, unspecified: Secondary | ICD-10-CM

## 2017-06-05 DIAGNOSIS — K219 Gastro-esophageal reflux disease without esophagitis: Secondary | ICD-10-CM

## 2017-06-05 MED ORDER — CLENPIQ 10-3.5-12 MG-GM -GM/160ML PO SOLN: 1.0000 | Freq: Once | ORAL | 0 refills | Status: AC

## 2017-06-05 NOTE — Telephone Encounter (Signed)
Spoke with pt and is aware pre-op scheduled for 07/05/17 at 9:00am. Letter mailed

## 2017-06-05 NOTE — Telephone Encounter (Signed)
Spoke with pt and she is scheduled for her TCS/EGD/DIL W/ MAC W/ RMR on 07/12/17 at 8:45am. Prep sent into the pharmacy. Instructions mailed to patient.

## 2017-06-05 NOTE — Telephone Encounter (Signed)
[phone]  Patient stated she was in last week and it was mentioned about her having some procedures scheduled. Was she supposed to call back here and schedule or were we going to call her to schedule

## 2017-06-13 ENCOUNTER — Telehealth: Payer: Self-pay | Admitting: Gastroenterology

## 2017-06-13 NOTE — Telephone Encounter (Signed)
LMOM, pt notified.

## 2017-06-13 NOTE — Telephone Encounter (Signed)
Pt isn't having diarrhea anymore. Pt would like to know if she she start back on the Amitiza? Pt knows she d/c at the last office visit, since she was having diarrhea.

## 2017-06-13 NOTE — Telephone Encounter (Signed)
Is she still having diarrhea? Needed stool studies if she was. Colonoscopy on file for April, but we need to ensure nothing else is going on.

## 2017-06-13 NOTE — Telephone Encounter (Signed)
Lmom, waiting on a return call.  

## 2017-06-13 NOTE — Telephone Encounter (Signed)
May resume Amitiza 24 BID. Monitor for diarrhea and titrate down if needed.

## 2017-06-18 ENCOUNTER — Telehealth: Payer: Self-pay | Admitting: Family Medicine

## 2017-06-18 ENCOUNTER — Encounter: Payer: Self-pay | Admitting: Family Medicine

## 2017-06-18 ENCOUNTER — Other Ambulatory Visit: Payer: Self-pay | Admitting: Family Medicine

## 2017-06-18 DIAGNOSIS — O903 Peripartum cardiomyopathy: Secondary | ICD-10-CM

## 2017-06-18 DIAGNOSIS — I2510 Atherosclerotic heart disease of native coronary artery without angina pectoris: Secondary | ICD-10-CM

## 2017-06-18 NOTE — Telephone Encounter (Signed)
Please advise if okay to complete referral.  Thank you!

## 2017-06-18 NOTE — Telephone Encounter (Signed)
Patient is requesting a referral to Dr.Branch, she said she is new to the area and is trying to get all of her doctors here. Cb#: [phone]

## 2017-06-18 NOTE — Progress Notes (Unsigned)
Ref cardiology °

## 2017-06-21 ENCOUNTER — Telehealth: Payer: Self-pay | Admitting: Family Medicine

## 2017-06-21 NOTE — Telephone Encounter (Signed)
Patient needs singular, Zyrtec, digoxin, and losartin (has not had BP meds in 2 days, starting to feel sick.)  Cardiologist normally fills these medications but she doesn't see her new cardiologist til April 16th ([place])  Pharmacy: [facility]  Cb#: [phone]

## 2017-06-22 ENCOUNTER — Other Ambulatory Visit: Payer: Self-pay | Admitting: Family Medicine

## 2017-06-22 MED ORDER — DIGOXIN 125 MCG PO TABS: 125.0000 ug | ORAL_TABLET | Freq: Every day | ORAL | 1 refills | Status: DC

## 2017-06-22 MED ORDER — CETIRIZINE HCL 10 MG PO TABS: 10.0000 mg | ORAL_TABLET | Freq: Every day | ORAL | 1 refills | Status: DC

## 2017-06-22 MED ORDER — LOSARTAN POTASSIUM 50 MG PO TABS: 50.0000 mg | ORAL_TABLET | Freq: Every day | ORAL | 1 refills | Status: DC

## 2017-06-22 NOTE — Telephone Encounter (Signed)
Call patient.  These medicines were sent to her pharmacy

## 2017-06-22 NOTE — Telephone Encounter (Signed)
Patient informed of message below, verbalized understanding.  

## 2017-07-03 NOTE — Patient Instructions (Signed)
Destiny Manning  07/03/2017     @PREFPERIOPPHARMACY @   Your procedure is scheduled on  07/12/2017   Report to [facility] at  645   A.M.  Call this number if you have problems the morning of surgery:  [phone]   Remember:  Do not eat food or drink liquids after midnight.  Take these medicines the morning of surgery with A SIP OF WATER  Baclofen, coreg, zyrtec, dexilant or protonix, digoxin, cozaar, singulair, phenergan.   Do not wear jewelry, make-up or nail polish.  Do not wear lotions, powders, or perfumes, or deodorant.  Do not shave 48 hours prior to surgery.  Men may shave face and neck.  Do not bring valuables to the hospital.  [facility] is not responsible for any belongings or valuables.  Contacts, dentures or bridgework may not be worn into surgery.  Leave your suitcase in the car.  After surgery it may be brought to your room.  For patients admitted to the hospital, discharge time will be determined by your treatment team.  Patients discharged the day of surgery will not be allowed to drive home.   Name and phone number of your driver:   family Special instructions:  Follow the diet and prep instructions given to you by Dr Roseanne Kaufman office.  Please read over the following fact sheets that you were given. Anesthesia Post-op Instructions and Care and Recovery After Surgery       Esophagogastroduodenoscopy Esophagogastroduodenoscopy (EGD) is a procedure to examine the lining of the esophagus, stomach, and first part of the small intestine (duodenum). This procedure is done to check for problems such as inflammation, bleeding, ulcers, or growths. During this procedure, a long, flexible, lighted tube with a camera attached (endoscope) is inserted down the throat. Tell a health care provider about:  Any allergies you have.  All medicines you are taking, including vitamins, herbs, eye drops, creams, and over-the-counter medicines.  Any  problems you or family members have had with anesthetic medicines.  Any blood disorders you have.  Any surgeries you have had.  Any medical conditions you have.  Whether you are pregnant or may be pregnant. What are the risks? Generally, this is a safe procedure. However, problems may occur, including:  Infection.  Bleeding.  A tear (perforation) in the esophagus, stomach, or duodenum.  Trouble breathing.  Excessive sweating.  Spasms of the larynx.  A slowed heartbeat.  Low blood pressure.  What happens before the procedure?  Follow instructions from your health care provider about eating or drinking restrictions.  Ask your health care provider about: ? Changing or stopping your regular medicines. This is especially important if you are taking diabetes medicines or blood thinners. ? Taking medicines such as aspirin and ibuprofen. These medicines can thin your blood. Do not take these medicines before your procedure if your health care provider instructs you not to.  Plan to have someone take you home after the procedure.  If you wear dentures, be ready to remove them before the procedure. What happens during the procedure?  To reduce your risk of infection, your health care team will wash or sanitize their hands.  An IV tube will be put in a vein in your hand or arm. You will get medicines and fluids through this tube.  You will be given one or more of the following: ? A medicine to help you relax (  sedative). ? A medicine to numb the area (local anesthetic). This medicine may be sprayed into your throat. It will make you feel more comfortable and keep you from gagging or coughing during the procedure. ? A medicine for pain.  A mouth guard may be placed in your mouth to protect your teeth and to keep you from biting on the endoscope.  You will be asked to lie on your left side.  The endoscope will be lowered down your throat into your esophagus, stomach, and  duodenum.  Air will be put into the endoscope. This will help your health care provider see better.  The lining of your esophagus, stomach, and duodenum will be examined.  Your health care provider may: ? Take a tissue sample so it can be looked at in a lab (biopsy). ? Remove growths. ? Remove objects (foreign bodies) that are stuck. ? Treat any bleeding with medicines or other devices that stop tissue from bleeding. ? Widen (dilate) or stretch narrowed areas of your esophagus and stomach.  The endoscope will be taken out. The procedure may vary among health care providers and hospitals. What happens after the procedure?  Your blood pressure, heart rate, breathing rate, and blood oxygen level will be monitored often until the medicines you were given have worn off.  Do not eat or drink anything until the numbing medicine has worn off and your gag reflex has returned. This information is not intended to replace advice given to you by your health care provider. Make sure you discuss any questions you have with your health care provider. Document Released: 07/28/2004 Document Revised: 09/02/2015 Document Reviewed: 02/18/2015 Elsevier Interactive Patient Education  2018 [employer]. Esophagogastroduodenoscopy, Care After Refer to this sheet in the next few weeks. These instructions provide you with information about caring for yourself after your procedure. Your health care provider may also give you more specific instructions. Your treatment has been planned according to current medical practices, but problems sometimes occur. Call your health care provider if you have any problems or questions after your procedure. What can I expect after the procedure? After the procedure, it is common to have:  A sore throat.  Nausea.  Bloating.  Dizziness.  Fatigue.  Follow these instructions at home:  Do not eat or drink anything until the numbing medicine (local anesthetic) has worn off  and your gag reflex has returned. You will know that the local anesthetic has worn off when you can swallow comfortably.  Do not drive for 24 hours if you received a medicine to help you relax (sedative).  If your health care provider took a tissue sample for testing during the procedure, make sure to get your test results. This is your responsibility. Ask your health care provider or the department performing the test when your results will be ready.  Keep all follow-up visits as told by your health care provider. This is important. Contact a health care provider if:  You cannot stop coughing.  You are not urinating.  You are urinating less than usual. Get help right away if:  You have trouble swallowing.  You cannot eat or drink.  You have throat or chest pain that gets worse.  You are dizzy or light-headed.  You faint.  You have nausea or vomiting.  You have chills.  You have a fever.  You have severe abdominal pain.  You have black, tarry, or bloody stools. This information is not intended to replace advice given to you by  your health care provider. Make sure you discuss any questions you have with your health care provider. Document Released: 03/13/2012 Document Revised: 09/02/2015 Document Reviewed: 02/18/2015 Elsevier Interactive Patient Education  2018 [employer].  Esophageal Dilatation Esophageal dilatation is a procedure to open a blocked or narrowed part of the esophagus. The esophagus is the long tube in your throat that carries food and liquid from your mouth to your stomach. The procedure is also called esophageal dilation. You may need this procedure if you have a buildup of scar tissue in your esophagus that makes it difficult, painful, or even impossible to swallow. This can be caused by gastroesophageal reflux disease (GERD). In rare cases, people need this procedure because they have cancer of the esophagus or a problem with the way food moves through  the esophagus. Sometimes you may need to have another dilatation to enlarge the opening of the esophagus gradually. Tell a health care provider about:  Any allergies you have.  All medicines you are taking, including vitamins, herbs, eye drops, creams, and over-the-counter medicines.  Any problems you or family members have had with anesthetic medicines.  Any blood disorders you have.  Any surgeries you have had.  Any medical conditions you have.  Any antibiotic medicines you are required to take before dental procedures. What are the risks? Generally, this is a safe procedure. However, problems can occur and include:  Bleeding from a tear in the lining of the esophagus.  A hole (perforation) in the esophagus.  What happens before the procedure?  Do not eat or drink anything after midnight on the night before the procedure or as directed by your health care provider.  Ask your health care provider about changing or stopping your regular medicines. This is especially important if you are taking diabetes medicines or blood thinners.  Plan to have someone take you home after the procedure. What happens during the procedure?  You will be given a medicine that makes you relaxed and sleepy (sedative).  A medicine may be sprayed or gargled to numb the back of the throat.  Your health care provider can use various instruments to do an esophageal dilatation. During the procedure, the instrument used will be placed in your mouth and passed down into your esophagus. Options include: ? Simple dilators. This instrument is carefully placed in the esophagus to stretch it. ? Guided wire bougies. In this method, a flexible tube (endoscope) is used to insert a wire into the esophagus. The dilator is passed over this wire to enlarge the esophagus. Then the wire is removed. ? Balloon dilators. An endoscope with a small balloon at the end is passed down into the esophagus. Inflating the balloon  gently stretches the esophagus and opens it up. What happens after the procedure?  Your blood pressure, heart rate, breathing rate, and blood oxygen level will be monitored often until the medicines you were given have worn off.  Your throat may feel slightly sore and will probably still feel numb. This will improve slowly over time.  You will not be allowed to eat or drink until the throat numbness has resolved.  If this is a same-day procedure, you may be allowed to go home once you have been able to drink, urinate, and sit on the edge of the bed without nausea or dizziness.  If this is a same-day procedure, you should have a friend or family member with you for the next 24 hours after the procedure. This information is not  intended to replace advice given to you by your health care provider. Make sure you discuss any questions you have with your health care provider. Document Released: 05/18/2005 Document Revised: 09/02/2015 Document Reviewed: 08/06/2013 Elsevier Interactive Patient Education  [employer].  Colonoscopy, Adult A colonoscopy is an exam to look at the large intestine. It is done to check for problems, such as:  Lumps (tumors).  Growths (polyps).  Swelling (inflammation).  Bleeding.  What happens before the procedure? Eating and drinking Follow instructions from your doctor about eating and drinking. These instructions may include:  A few days before the procedure - follow a low-fiber diet. ? Avoid nuts. ? Avoid seeds. ? Avoid dried fruit. ? Avoid raw fruits. ? Avoid vegetables.  1-3 days before the procedure - follow a clear liquid diet. Avoid liquids that have red or purple dye. Drink only clear liquids, such as: ? Clear broth or bouillon. ? Black coffee or tea. ? Clear juice. ? Clear soft drinks or sports drinks. ? Gelatin dessert. ? Popsicles.  On the day of the procedure - do not eat or drink anything during the 2 hours before the  procedure.  Bowel prep If you were prescribed an oral bowel prep:  Take it as told by your doctor. Starting the day before your procedure, you will need to drink a lot of liquid. The liquid will cause you to poop (have bowel movements) until your poop is almost clear or light green.  If your skin or butt gets irritated from diarrhea, you may: ? Wipe the area with wipes that have medicine in them, such as adult wet wipes with aloe and vitamin E. ? Put something on your skin that soothes the area, such as petroleum jelly.  If you throw up (vomit) while drinking the bowel prep, take a break for up to 60 minutes. Then begin the bowel prep again. If you keep throwing up and you cannot take the bowel prep without throwing up, call your doctor.  General instructions  Ask your doctor about changing or stopping your normal medicines. This is important if you take diabetes medicines or blood thinners.  Plan to have someone take you home from the hospital or clinic. What happens during the procedure?  An IV tube may be put into one of your veins.  You will be given medicine to help you relax (sedative).  To reduce your risk of infection: ? Your doctors will wash their hands. ? Your anal area will be washed with soap.  You will be asked to lie on your side with your knees bent.  Your doctor will get a long, thin, flexible tube ready. The tube will have a camera and a light on the end.  The tube will be put into your anus.  The tube will be gently put into your large intestine.  Air will be delivered into your large intestine to keep it open. You may feel some pressure or cramping.  The camera will be used to take photos.  A small tissue sample may be removed from your body to be looked at under a microscope (biopsy). If any possible problems are found, the tissue will be sent to a lab for testing.  If small growths are found, your doctor may remove them and have them checked for  cancer.  The tube that was put into your anus will be slowly removed. The procedure may vary among doctors and hospitals. What happens after the procedure?  Your doctor  will check on you often until the medicines you were given have worn off.  Do not drive for 24 hours after the procedure.  You may have a small amount of blood in your poop.  You may pass gas.  You may have mild cramps or bloating in your belly (abdomen).  It is up to you to get the results of your procedure. Ask your doctor, or the department performing the procedure, when your results will be ready. This information is not intended to replace advice given to you by your health care provider. Make sure you discuss any questions you have with your health care provider. Document Released: 04/29/2010 Document Revised: 01/26/2016 Document Reviewed: 06/08/2015 Elsevier Interactive Patient Education  2017 [employer]  Colonoscopy, Adult, Care After This sheet gives you information about how to care for yourself after your procedure. Your health care provider may also give you more specific instructions. If you have problems or questions, contact your health care provider. What can I expect after the procedure? After the procedure, it is common to have:  A small amount of blood in your stool for 24 hours after the procedure.  Some gas.  Mild abdominal cramping or bloating.  Follow these instructions at home: General instructions   For the first 24 hours after the procedure: ? Do not drive or use machinery. ? Do not sign important documents. ? Do not drink alcohol. ? Do your regular daily activities at a slower pace than normal. ? Eat soft, easy-to-digest foods. ? Rest often.  Take over-the-counter or prescription medicines only as told by your health care provider.  It is up to you to get the results of your procedure. Ask your health care provider, or the department performing the procedure, when your  results will be ready. Relieving cramping and bloating  Try walking around when you have cramps or feel bloated.  Apply heat to your abdomen as told by your health care provider. Use a heat source that your health care provider recommends, such as a moist heat pack or a heating pad. ? Place a towel between your skin and the heat source. ? Leave the heat on for 20-30 minutes. ? Remove the heat if your skin turns bright red. This is especially important if you are unable to feel pain, heat, or cold. You may have a greater risk of getting burned. Eating and drinking  Drink enough fluid to keep your urine clear or pale yellow.  Resume your normal diet as instructed by your health care provider. Avoid heavy or fried foods that are hard to digest.  Avoid drinking alcohol for as long as instructed by your health care provider. Contact a health care provider if:  You have blood in your stool 2-3 days after the procedure. Get help right away if:  You have more than a small spotting of blood in your stool.  You pass large blood clots in your stool.  Your abdomen is swollen.  You have nausea or vomiting.  You have a fever.  You have increasing abdominal pain that is not relieved with medicine. This information is not intended to replace advice given to you by your health care provider. Make sure you discuss any questions you have with your health care provider. Document Released: 11/09/2003 Document Revised: 12/20/2015 Document Reviewed: 06/08/2015 Elsevier Interactive Patient Education  2018 [place] Anesthesia is a term that refers to techniques, procedures, and medicines that help a person stay safe and  comfortable during a medical procedure. Monitored anesthesia care, or sedation, is one type of anesthesia. Your anesthesia specialist may recommend sedation if you will be having a procedure that does not require you to be unconscious, such as:  Cataract  surgery.  A dental procedure.  A biopsy.  A colonoscopy.  During the procedure, you may receive a medicine to help you relax (sedative). There are three levels of sedation:  Mild sedation. At this level, you may feel awake and relaxed. You will be able to follow directions.  Moderate sedation. At this level, you will be sleepy. You may not remember the procedure.  Deep sedation. At this level, you will be asleep. You will not remember the procedure.  The more medicine you are given, the deeper your level of sedation will be. Depending on how you respond to the procedure, the anesthesia specialist may change your level of sedation or the type of anesthesia to fit your needs. An anesthesia specialist will monitor you closely during the procedure. Let your health care provider know about:  Any allergies you have.  All medicines you are taking, including vitamins, herbs, eye drops, creams, and over-the-counter medicines.  Any use of steroids (by mouth or as a cream).  Any problems you or family members have had with sedatives and anesthetic medicines.  Any blood disorders you have.  Any surgeries you have had.  Any medical conditions you have, such as sleep apnea.  Whether you are pregnant or may be pregnant.  Any use of cigarettes, alcohol, or street drugs. What are the risks? Generally, this is a safe procedure. However, problems may occur, including:  Getting too much medicine (oversedation).  Nausea.  Allergic reaction to medicines.  Trouble breathing. If this happens, a breathing tube may be used to help with breathing. It will be removed when you are awake and breathing on your own.  Heart trouble.  Lung trouble.  Before the procedure Staying hydrated Follow instructions from your health care provider about hydration, which may include:  Up to 2 hours before the procedure - you may continue to drink clear liquids, such as water, clear fruit juice, black  coffee, and plain tea.  Eating and drinking restrictions Follow instructions from your health care provider about eating and drinking, which may include:  8 hours before the procedure - stop eating heavy meals or foods such as meat, fried foods, or fatty foods.  6 hours before the procedure - stop eating light meals or foods, such as toast or cereal.  6 hours before the procedure - stop drinking milk or drinks that contain milk.  2 hours before the procedure - stop drinking clear liquids.  Medicines Ask your health care provider about:  Changing or stopping your regular medicines. This is especially important if you are taking diabetes medicines or blood thinners.  Taking medicines such as aspirin and ibuprofen. These medicines can thin your blood. Do not take these medicines before your procedure if your health care provider instructs you not to.  Tests and exams  You will have a physical exam.  You may have blood tests done to show: ? How well your kidneys and liver are working. ? How well your blood can clot.  General instructions  Plan to have someone take you home from the hospital or clinic.  If you will be going home right after the procedure, plan to have someone with you for 24 hours.  What happens during the procedure?  Your blood pressure,  heart rate, breathing, level of pain and overall condition will be monitored.  An IV tube will be inserted into one of your veins.  Your anesthesia specialist will give you medicines as needed to keep you comfortable during the procedure. This may mean changing the level of sedation.  The procedure will be performed. After the procedure  Your blood pressure, heart rate, breathing rate, and blood oxygen level will be monitored until the medicines you were given have worn off.  Do not drive for 24 hours if you received a sedative.  You may: ? Feel sleepy, clumsy, or nauseous. ? Feel forgetful about what happened after the  procedure. ? Have a sore throat if you had a breathing tube during the procedure. ? Vomit. This information is not intended to replace advice given to you by your health care provider. Make sure you discuss any questions you have with your health care provider. Document Released: 12/21/2004 Document Revised: 09/03/2015 Document Reviewed: 07/18/2015 Elsevier Interactive Patient Education  2018 [place], Care After These instructions provide you with information about caring for yourself after your procedure. Your health care provider may also give you more specific instructions. Your treatment has been planned according to current medical practices, but problems sometimes occur. Call your health care provider if you have any problems or questions after your procedure. What can I expect after the procedure? After your procedure, it is common to:  Feel sleepy for several hours.  Feel clumsy and have poor balance for several hours.  Feel forgetful about what happened after the procedure.  Have poor judgment for several hours.  Feel nauseous or vomit.  Have a sore throat if you had a breathing tube during the procedure.  Follow these instructions at home: For at least 24 hours after the procedure:   Do not: ? Participate in activities in which you could fall or become injured. ? Drive. ? Use heavy machinery. ? Drink alcohol. ? Take sleeping pills or medicines that cause drowsiness. ? Make important decisions or sign legal documents. ? Take care of children on your own.  Rest. Eating and drinking  Follow the diet that is recommended by your health care provider.  If you vomit, drink water, juice, or soup when you can drink without vomiting.  Make sure you have little or no nausea before eating solid foods. General instructions  Have a responsible adult stay with you until you are awake and alert.  Take over-the-counter and prescription  medicines only as told by your health care provider.  If you smoke, do not smoke without supervision.  Keep all follow-up visits as told by your health care provider. This is important. Contact a health care provider if:  You keep feeling nauseous or you keep vomiting.  You feel light-headed.  You develop a rash.  You have a fever. Get help right away if:  You have trouble breathing. This information is not intended to replace advice given to you by your health care provider. Make sure you discuss any questions you have with your health care provider. Document Released: 07/18/2015 Document Revised: 11/17/2015 Document Reviewed: 07/18/2015 Elsevier Interactive Patient Education  [employer].

## 2017-07-05 ENCOUNTER — Encounter (HOSPITAL_COMMUNITY): Payer: Self-pay

## 2017-07-05 ENCOUNTER — Encounter (HOSPITAL_COMMUNITY)
Admission: RE | Admit: 2017-07-05 | Discharge: 2017-07-05 | Disposition: A | Discharge status: Home / Self Care | Payer: Medicaid Other | Source: Ambulatory Visit | Attending: Internal Medicine | Admitting: Internal Medicine

## 2017-07-05 ENCOUNTER — Other Ambulatory Visit: Payer: Self-pay

## 2017-07-05 DIAGNOSIS — Z01818 Encounter for other preprocedural examination: Secondary | ICD-10-CM | POA: Insufficient documentation

## 2017-07-05 DIAGNOSIS — Z01812 Encounter for preprocedural laboratory examination: Secondary | ICD-10-CM | POA: Insufficient documentation

## 2017-07-05 DIAGNOSIS — R9431 Abnormal electrocardiogram [ECG] [EKG]: Secondary | ICD-10-CM | POA: Diagnosis not present

## 2017-07-05 LAB — BASIC METABOLIC PANEL
Anion gap: 12 (ref 5–15)
BUN: 8 mg/dL (ref 6–20)
CALCIUM: 9.4 mg/dL (ref 8.9–10.3)
CHLORIDE: 103 mmol/L (ref 101–111)
CO2: 23 mmol/L (ref 22–32)
CREATININE: 0.84 mg/dL (ref 0.44–1.00)
Glucose, Bld: 94 mg/dL (ref 65–99)
Potassium: 4.2 mmol/L (ref 3.5–5.1)
SODIUM: 138 mmol/L (ref 135–145)

## 2017-07-05 LAB — CBC WITH DIFFERENTIAL/PLATELET
BASOS PCT: 0 %
Basophils Absolute: 0 10*3/uL (ref 0.0–0.1)
EOS ABS: 0.1 10*3/uL (ref 0.0–0.7)
Eosinophils Relative: 1 %
HCT: 44.8 % (ref 36.0–46.0)
HEMOGLOBIN: 14.9 g/dL (ref 12.0–15.0)
Lymphocytes Relative: 46 %
Lymphs Abs: 4 10*3/uL (ref 0.7–4.0)
MCH: 30 pg (ref 26.0–34.0)
MCHC: 33.3 g/dL (ref 30.0–36.0)
MCV: 90.3 fL (ref 78.0–100.0)
MONOS PCT: 9 %
Monocytes Absolute: 0.8 10*3/uL (ref 0.1–1.0)
NEUTROS PCT: 44 %
Neutro Abs: 3.8 10*3/uL (ref 1.7–7.7)
Platelets: 287 10*3/uL (ref 150–400)
RBC: 4.96 MIL/uL (ref 3.87–5.11)
RDW: 14.3 % (ref 11.5–15.5)
WBC: 8.7 10*3/uL (ref 4.0–10.5)

## 2017-07-12 ENCOUNTER — Encounter (HOSPITAL_COMMUNITY): Payer: Self-pay

## 2017-07-12 ENCOUNTER — Ambulatory Visit (HOSPITAL_COMMUNITY): Payer: Medicaid Other | Admitting: Anesthesiology

## 2017-07-12 ENCOUNTER — Encounter (HOSPITAL_COMMUNITY)
Admission: RE | Disposition: A | Discharge status: Home / Self Care | Payer: Self-pay | Source: Ambulatory Visit | Attending: Internal Medicine

## 2017-07-12 ENCOUNTER — Ambulatory Visit (HOSPITAL_COMMUNITY)
Admission: RE | Admit: 2017-07-12 | Discharge: 2017-07-12 | Disposition: A | Discharge status: Home / Self Care | Payer: Medicaid Other | Source: Ambulatory Visit | Attending: Internal Medicine | Admitting: Internal Medicine

## 2017-07-12 ENCOUNTER — Other Ambulatory Visit: Payer: Self-pay

## 2017-07-12 DIAGNOSIS — K529 Noninfective gastroenteritis and colitis, unspecified: Secondary | ICD-10-CM

## 2017-07-12 DIAGNOSIS — R1310 Dysphagia, unspecified: Secondary | ICD-10-CM | POA: Insufficient documentation

## 2017-07-12 DIAGNOSIS — K219 Gastro-esophageal reflux disease without esophagitis: Secondary | ICD-10-CM | POA: Insufficient documentation

## 2017-07-12 DIAGNOSIS — Z7951 Long term (current) use of inhaled steroids: Secondary | ICD-10-CM | POA: Insufficient documentation

## 2017-07-12 DIAGNOSIS — J45909 Unspecified asthma, uncomplicated: Secondary | ICD-10-CM | POA: Insufficient documentation

## 2017-07-12 DIAGNOSIS — I509 Heart failure, unspecified: Secondary | ICD-10-CM | POA: Insufficient documentation

## 2017-07-12 DIAGNOSIS — Z79899 Other long term (current) drug therapy: Secondary | ICD-10-CM | POA: Diagnosis not present

## 2017-07-12 DIAGNOSIS — Z87891 Personal history of nicotine dependence: Secondary | ICD-10-CM | POA: Diagnosis not present

## 2017-07-12 DIAGNOSIS — K449 Diaphragmatic hernia without obstruction or gangrene: Secondary | ICD-10-CM | POA: Insufficient documentation

## 2017-07-12 DIAGNOSIS — I420 Dilated cardiomyopathy: Secondary | ICD-10-CM | POA: Diagnosis not present

## 2017-07-12 DIAGNOSIS — I110 Hypertensive heart disease with heart failure: Secondary | ICD-10-CM | POA: Diagnosis not present

## 2017-07-12 DIAGNOSIS — D120 Benign neoplasm of cecum: Secondary | ICD-10-CM | POA: Diagnosis not present

## 2017-07-12 HISTORY — PX: MALONEY DILATION: SHX5535

## 2017-07-12 HISTORY — PX: COLONOSCOPY WITH PROPOFOL: SHX5780

## 2017-07-12 HISTORY — PX: ESOPHAGOGASTRODUODENOSCOPY (EGD) WITH PROPOFOL: SHX5813

## 2017-07-12 HISTORY — PX: POLYPECTOMY: SHX5525

## 2017-07-12 SURGERY — COLONOSCOPY WITH PROPOFOL
Anesthesia: Monitor Anesthesia Care

## 2017-07-12 MED ORDER — LIDOCAINE VISCOUS 2 % MT SOLN
OROMUCOSAL | Status: DC | PRN
Start: 2017-07-12 — End: 2017-07-12
  Administered 2017-07-12: 1 via OROMUCOSAL

## 2017-07-12 MED ORDER — PROPOFOL 500 MG/50ML IV EMUL
INTRAVENOUS | Status: DC | PRN
  Administered 2017-07-12: 150 ug/kg/min via INTRAVENOUS
  Administered 2017-07-12: 09:00:00 via INTRAVENOUS
  Administered 2017-07-12: 100 ug/kg/min via INTRAVENOUS

## 2017-07-12 MED ORDER — PROPOFOL 10 MG/ML IV BOLUS
INTRAVENOUS | Status: DC | PRN
  Administered 2017-07-12: 20 mg via INTRAVENOUS
  Administered 2017-07-12: 10 mg via INTRAVENOUS
  Administered 2017-07-12 (×5): 20 mg via INTRAVENOUS
  Administered 2017-07-12: 10 mg via INTRAVENOUS

## 2017-07-12 MED ORDER — MIDAZOLAM HCL 2 MG/2ML IJ SOLN: 0.5000 mg | INTRAMUSCULAR | Status: DC | PRN

## 2017-07-12 MED ORDER — STERILE WATER FOR IRRIGATION IR SOLN
Status: DC | PRN
  Administered 2017-07-12: 08:00:00

## 2017-07-12 MED ORDER — LIDOCAINE VISCOUS 2 % MT SOLN
OROMUCOSAL | Status: AC
  Filled 2017-07-12: qty 15

## 2017-07-12 MED ORDER — LACTATED RINGERS IV SOLN
INTRAVENOUS | Status: DC
  Administered 2017-07-12: 08:00:00 via INTRAVENOUS

## 2017-07-12 NOTE — Op Note (Addendum)
[facility] Patient Name: Destiny Manning Procedure Date: 07/12/2017 8:02 AM MRN: [id number] Date of Birth: [birth year] Attending MD: Norvel Richards , MD CSN: [id number] Age: 47 Admit Type: Outpatient Procedure:                Upper GI endoscopy Indications:              Dysphagia Providers:                Norvel Richards, MD, Charlsie Quest. Theda Sers RN, RN,                            Aram Candela Referring MD:              Medicines:                Propofol per Anesthesia Complications:            No immediate complications. Estimated Blood Loss:     Estimated blood loss was minimal. Procedure:                Pre-Anesthesia Assessment:                           - Prior to the procedure, a History and Physical                            was performed, and patient medications and                            allergies were reviewed. The patient's tolerance of                            previous anesthesia was also reviewed. The risks                            and benefits of the procedure and the sedation                            options and risks were discussed with the patient.                            All questions were answered, and informed consent                            was obtained. Prior Anticoagulants: The patient has                            taken no previous anticoagulant or antiplatelet                            agents. ASA Grade Assessment: II - A patient with                            mild systemic disease. After reviewing the risks  and benefits, the patient was deemed in                            satisfactory condition to undergo the procedure.                           After obtaining informed consent, the endoscope was                            passed under direct vision. Throughout the                            procedure, the patient's blood pressure, pulse, and                            oxygen saturations were monitored  continuously. The                            [id number] ([id number]) scope was introduced through the                            mouth, and advanced to the second part of duodenum.                            The upper GI endoscopy was accomplished without                            difficulty. The patient tolerated the procedure                            well. Scope In: 8:28:43 AM Scope Out: 8:43:06 AM Total Procedure Duration: 0 hours 14 minutes 23 seconds  Findings:      The examined esophagus was normal.      A small hiatal hernia was present.      No other significant abnormalities were identified in a careful       examination of the stomach.      The duodenal bulb and second portion of the duodenum were normal.       Biopsies were taken with a cold forceps for histology. The scope was       withdrawn. Dilation was performed with a Maloney dilator with mild       resistance at 12 Fr. The dilation site was examined following endoscope       reinsertion and showed no change. Estimated blood loss was minimal. Impression:               - Normal esophagus. Dilated.                           - Small hiatal hernia.                           - Normal duodenal bulb and second portion of the                            duodenum. Biopsied. Moderate  Sedation:      Moderate (conscious) sedation was personally administered by an       anesthesia professional. The following parameters were monitored: oxygen       saturation, heart rate, blood pressure, respiratory rate, EKG, adequacy       of pulmonary ventilation, and response to care. Total physician       intraservice time was 23 minutes. Recommendation:           - Patient has a contact number available for                            emergencies. The signs and symptoms of potential                            delayed complications were discussed with the                            patient. Return to normal activities tomorrow.                             Written discharge instructions were provided to the                            patient.                           - Resume previous diet.                           - Continue present medications.                           - No repeat upper endoscopy.                           - Return to GI office in 3 months. See colonoscopy                            report. Procedure Code(s):        --- Professional ---                           [id number], Esophagogastroduodenoscopy, flexible,                            transoral; with biopsy, single or multiple                           43450, Dilation of esophagus, by unguided sound or                            bougie, single or multiple passes Diagnosis Code(s):        --- Professional ---                           K44.9, Diaphragmatic hernia without obstruction or  gangrene                           R13.10, Dysphagia, unspecified CPT copyright 2017 American Medical Association. All rights reserved. The codes documented in this report are preliminary and upon coder review may  be revised to meet current compliance requirements. Cristopher Estimable. Dorine Duffey, MD Norvel Richards, MD 07/12/2017 8:50:01 AM This report has been signed electronically. Number of Addenda: 1 Addendum Number: 1   Addendum Date: 08/23/2017 11:32:38 AM      no Biopsies performed during EGD Cristopher Estimable. Tymira Horkey, MD Norvel Richards, MD 08/23/2017 11:47:39 AM This report has been signed electronically.

## 2017-07-12 NOTE — Anesthesia Preprocedure Evaluation (Signed)
Anesthesia Evaluation  Patient identified by MRN, date of birth, ID band Patient awake    Reviewed: Allergy & Precautions, NPO status , Patient's Chart, lab work & pertinent test results  Airway Mallampati: II  TM Distance: >3 FB Neck ROM: Limited    Dental no notable dental hx.    Pulmonary former smoker,    Pulmonary exam normal breath sounds clear to auscultation       Cardiovascular hypertension, + CAD and +CHF  Normal cardiovascular exam Rhythm:Regular Rate:Normal  Cardiomyopathy, pregnancy induced per patient hx   Neuro/Psych    GI/Hepatic GERD  ,  Endo/Other    Renal/GU      Musculoskeletal  (+) Arthritis ,   Abdominal   Peds  Hematology   Anesthesia Other Findings   Reproductive/Obstetrics                             Anesthesia Physical Anesthesia Plan  ASA: III  Anesthesia Plan: MAC   Post-op Pain Management:    Induction: Intravenous  PONV Risk Score and Plan:   Airway Management Planned: Nasal Cannula  Additional Equipment:   Intra-op Plan:   Post-operative Plan:   Informed Consent: I have reviewed the patients History and Physical, chart, labs and discussed the procedure including the risks, benefits and alternatives for the proposed anesthesia with the patient or authorized representative who has indicated his/her understanding and acceptance.     Plan Discussed with: CRNA  Anesthesia Plan Comments:         Anesthesia Quick Evaluation

## 2017-07-12 NOTE — Anesthesia Postprocedure Evaluation (Signed)
Anesthesia Post Note  Patient: RODNESHA ELIE  Procedure(s) Performed: COLONOSCOPY WITH PROPOFOL (N/A ) ESOPHAGOGASTRODUODENOSCOPY (EGD) WITH PROPOFOL (N/A ) MALONEY DILATION (N/A ) POLYPECTOMY  Patient location during evaluation: PACU Anesthesia Type: MAC Level of consciousness: awake and alert and patient cooperative Pain management: satisfactory to patient Vital Signs Assessment: post-procedure vital signs reviewed and stable Respiratory status: spontaneous breathing Cardiovascular status: stable Postop Assessment: no apparent nausea or vomiting Anesthetic complications: no     Last Vitals:  Vitals:   07/12/17 0913 07/12/17 0915  BP: 126/85   Pulse: (!) 29 86  Resp: (!) 21 10  Temp: 37 C   SpO2: 92% 100%    Last Pain:  Vitals:   07/12/17 0820  TempSrc:   PainSc: 0-No pain                 Kalla Watson

## 2017-07-12 NOTE — Discharge Instructions (Signed)
°Colonoscopy °Discharge Instructions ° °Read the instructions outlined below and refer to this sheet in the next few weeks. These discharge instructions provide you with general information on caring for yourself after you leave the hospital. Your doctor may also give you specific instructions. While your treatment has been planned according to the most current medical practices available, unavoidable complications occasionally occur. If you have any problems or questions after discharge, call Dr. Rourk at [phone]. °ACTIVITY °· You may resume your regular activity, but move at a slower pace for the next 24 hours.  °· Take frequent rest periods for the next 24 hours.  °· Walking will help get rid of the air and reduce the bloated feeling in your belly (abdomen).  °· No driving for 24 hours (because of the medicine (anesthesia) used during the test).   °· Do not sign any important legal documents or operate any machinery for 24 hours (because of the anesthesia used during the test).  °NUTRITION °· Drink plenty of fluids.  °· You may resume your normal diet as instructed by your doctor.  °· Begin with a light meal and progress to your normal diet. Heavy or fried foods are harder to digest and may make you feel sick to your stomach (nauseated).  °· Avoid alcoholic beverages for 24 hours or as instructed.  °MEDICATIONS °· You may resume your normal medications unless your doctor tells you otherwise.  °WHAT YOU CAN EXPECT TODAY °· Some feelings of bloating in the abdomen.  °· Passage of more gas than usual.  °· Spotting of blood in your stool or on the toilet paper.  °IF YOU HAD POLYPS REMOVED DURING THE COLONOSCOPY: °· No aspirin products for 7 days or as instructed.  °· No alcohol for 7 days or as instructed.  °· Eat a soft diet for the next 24 hours.  °FINDING OUT THE RESULTS OF YOUR TEST °Not all test results are available during your visit. If your test results are not back during the visit, make an appointment  with your caregiver to find out the results. Do not assume everything is normal if you have not heard from your caregiver or the medical facility. It is important for you to follow up on all of your test results.  °SEEK IMMEDIATE MEDICAL ATTENTION IF: °· You have more than a spotting of blood in your stool.  °· Your belly is swollen (abdominal distention).  °· You are nauseated or vomiting.  °· You have a temperature over 101.  °· You have abdominal pain or discomfort that is severe or gets worse throughout the day.  °EGD °Discharge instructions °Please read the instructions outlined below and refer to this sheet in the next few weeks. These discharge instructions provide you with general information on caring for yourself after you leave the hospital. Your doctor may also give you specific instructions. While your treatment has been planned according to the most current medical practices available, unavoidable complications occasionally occur. If you have any problems or questions after discharge, please call your doctor. °ACTIVITY °· You may resume your regular activity but move at a slower pace for the next 24 hours.  °· Take frequent rest periods for the next 24 hours.  °· Walking will help expel (get rid of) the air and reduce the bloated feeling in your abdomen.  °· No driving for 24 hours (because of the anesthesia (medicine) used during the test).  °· You may shower.  °· Do not sign any important   legal documents or operate any machinery for 24 hours (because of the anesthesia used during the test).  NUTRITION  Drink plenty of fluids.   You may resume your normal diet.   Begin with a light meal and progress to your normal diet.   Avoid alcoholic beverages for 24 hours or as instructed by your caregiver.  MEDICATIONS  You may resume your normal medications unless your caregiver tells you otherwise.  WHAT YOU CAN EXPECT TODAY  You may experience abdominal discomfort such as a feeling of fullness  or gas pains.  FOLLOW-UP  Your doctor will discuss the results of your test with you.  SEEK IMMEDIATE MEDICAL ATTENTION IF ANY OF THE FOLLOWING OCCUR:  Excessive nausea (feeling sick to your stomach) and/or vomiting.   Severe abdominal pain and distention (swelling).   Trouble swallowing.   Temperature over 101 F (37.8 C).   Rectal bleeding or vomiting of blood.    Colon polyp information provided  Further recommendations to follow pending review of pathology report  Office visit with [place] in 3 months.   PATIENT MAY RESUME ALL MEDICATIONS PER DR. Gala Romney  Colon Polyps Polyps are tissue growths inside the body. Polyps can grow in many places, including the large intestine (colon). A polyp may be a round bump or a mushroom-shaped growth. You could have one polyp or several. Most colon polyps are noncancerous (benign). However, some colon polyps can become cancerous over time. What are the causes? The exact cause of colon polyps is not known. What increases the risk? This condition is more likely to develop in people who:  Have a family history of colon cancer or colon polyps.  Are older than 46 or older than 45 if they are African American.  Have inflammatory bowel disease, such as ulcerative colitis or Crohn disease.  Are overweight.  Smoke cigarettes.  Do not get enough exercise.  Drink too much alcohol.  Eat a diet that is: ? High in fat and red meat. ? Low in fiber.  Had childhood cancer that was treated with abdominal radiation.  What are the signs or symptoms? Most polyps do not cause symptoms. If you have symptoms, they may include:  Blood coming from your rectum when having a bowel movement.  Blood in your stool.The stool may look dark red or black.  A change in bowel habits, such as constipation or diarrhea.  How is this diagnosed? This condition is diagnosed with a colonoscopy. This is a procedure that uses a lighted, flexible scope to look at  the inside of your colon. How is this treated? Treatment for this condition involves removing any polyps that are found. Those polyps will then be tested for cancer. If cancer is found, your health care provider will talk to you about options for colon cancer treatment. Follow these instructions at home: Diet  Eat plenty of fiber, such as fruits, vegetables, and whole grains.  Eat foods that are high in calcium and vitamin D, such as milk, cheese, yogurt, eggs, liver, fish, and broccoli.  Limit foods high in fat, red meats, and processed meats, such as hot dogs, sausage, bacon, and lunch meats.  Maintain a healthy weight, or lose weight if recommended by your health care provider. General instructions  Do not smoke cigarettes.  Do not drink alcohol excessively.  Keep all follow-up visits as told by your health care provider. This is important. This includes keeping regularly scheduled colonoscopies. Talk to your health care provider about when you need  a colonoscopy.  Exercise every day or as told by your health care provider. Contact a health care provider if:  You have new or worsening bleeding during a bowel movement.  You have new or increased blood in your stool.  You have a change in bowel habits.  You unexpectedly lose weight. This information is not intended to replace advice given to you by your health care provider. Make sure you discuss any questions you have with your health care provider. Document Released: 12/22/2003 Document Revised: 09/02/2015 Document Reviewed: 02/15/2015 Elsevier Interactive Patient Education  2018 [employer] POST-ANESTHESIA  IMMEDIATELY FOLLOWING SURGERY:  Do not drive or operate machinery for the first twenty four hours after surgery.  Do not make any important decisions for twenty four hours after surgery or while taking narcotic pain medications or sedatives.  If you develop intractable nausea and vomiting or a  severe headache please notify your doctor immediately.  FOLLOW-UP:  Please make an appointment with your surgeon as instructed. You do not need to follow up with anesthesia unless specifically instructed to do so.  WOUND CARE INSTRUCTIONS (if applicable):  Keep a dry clean dressing on the anesthesia/puncture wound site if there is drainage.  Once the wound has quit draining you may leave it open to air.  Generally you should leave the bandage intact for twenty four hours unless there is drainage.  If the epidural site drains for more than 36-48 hours please call the anesthesia department.  QUESTIONS?:  Please feel free to call your physician or the hospital operator if you have any questions, and they will be happy to assist you.

## 2017-07-12 NOTE — Op Note (Signed)
[facility] Patient Name: Destiny Manning Procedure Date: 07/12/2017 8:48 AM MRN: [id number] Date of Birth: Jan 01, 1971 Attending MD: Norvel Richards , MD CSN: [id number] Age: 47 Admit Type: Inpatient Procedure:                Ileo-Colonoscopy with snare polypectomy and biopsy Indications:              Chronic diarrhea Providers:                Norvel Richards, MD, Charlsie Quest. Theda Sers RN, RN,                            Aram Candela Referring MD:              Medicines:                Propofol per Anesthesia Complications:            No immediate complications. Estimated Blood Loss:     Estimated blood loss was minimal. Procedure:                Pre-Anesthesia Assessment:                           - Prior to the procedure, a History and Physical                            was performed, and patient medications and                            allergies were reviewed. The patient's tolerance of                            previous anesthesia was also reviewed. The risks                            and benefits of the procedure and the sedation                            options and risks were discussed with the patient.                            All questions were answered, and informed consent                            was obtained. Prior Anticoagulants: The patient has                            taken no previous anticoagulant or antiplatelet                            agents. ASA Grade Assessment: II - A patient with                            mild systemic disease. After reviewing the risks  and benefits, the patient was deemed in                            satisfactory condition to undergo the procedure.                           After obtaining informed consent, the colonoscope                            was passed under direct vision. Throughout the                            procedure, the patient's blood pressure, pulse, and         oxygen saturations were monitored continuously. The                            [id number] ([id number]) scope was introduced through                            the anus and advanced to the 5 cm into the ileum. Scope In: 8:51:09 AM Scope Out: 9:02:02 AM Scope Withdrawal Time: 0 hours 5 minutes 30 seconds  Total Procedure Duration: 0 hours 10 minutes 53 seconds  Findings:      The perianal and digital rectal examinations were normal.      A 10 mm polyp was found in the cecum. The polyp was sessile. The polyp       was removed with a hot snare. Resection and retrieval were complete.       Estimated blood loss: none. This was biopsied with a cold forceps for       histology. Estimated blood loss was minimal.      The exam was otherwise without abnormality on direct and retroflexion       views. The distal 5 cm inal ileum mucosa appered normal. Segmental       biopsies of the right left colon taken. Impression:               - One 10 mm polyp in the cecum, removed with a hot                            snare. Resected and retrieved. Segmental Biopsies                            taken.                           - The examination was otherwise normal on direct                            and retroflexion views. Moderate Sedation:      Moderate (conscious) sedation was personally administered by an       anesthesia professional. The following parameters were monitored: oxygen       saturation, heart rate, blood pressure, respiratory rate, EKG, adequacy       of pulmonary ventilation, and response to care. Total physician       intraservice time was 48  minutes. Recommendation:           - Patient has a contact number available for                            emergencies. The signs and symptoms of potential                            delayed complications were discussed with the                            patient. Return to normal activities tomorrow.                            Written discharge  instructions were provided to the                            patient.                           - Resume previous diet.                           - Continue present medications.                           - Await pathology results.                           - Repeat colonoscopy date to be determined after                            pending pathology results are reviewed for                            surveillance. See EGD report.                           - Return to GI office in 3 months. Procedure Code(s):        --- Professional ---                           [phone], Colonoscopy, flexible; with removal of                            tumor(s), polyp(s), or other lesion(s) by snare                            technique Diagnosis Code(s):        --- Professional ---                           D12.0, Benign neoplasm of cecum                           K52.9, Noninfective gastroenteritis and colitis,  unspecified CPT copyright 2017 American Medical Association. All rights reserved. The codes documented in this report are preliminary and upon coder review may  be revised to meet current compliance requirements. Destiny Manning. Destiny Snowden, MD Norvel Richards, MD 07/12/2017 9:09:04 AM This report has been signed electronically. Number of Addenda: 0

## 2017-07-12 NOTE — H&P (Signed)
@LOGO @   Primary Care Physician:  Raylene Everts, MD Primary Gastroenterologist:  Dr. Gala Romney  Pre-Procedure History & Physical: HPI:  Destiny Manning is a 47 y.o. female here for EGD for dysphagia and TCS for chronic diarrhea.  Past Medical History:  Diagnosis Date  . Asthma dx 06/04/07   ventolin, singulair  . Blood transfusion without reported diagnosis   . CHF (congestive heart failure) ([place]) 03/19/08   EF 35-40%  . DCM (dilated cardiomyopathy) ([place]) dx 06/06/07   EF 10-15%  . Degeneration of spine   . DJD (degenerative joint disease), lumbar 11/16/2016  . GERD (gastroesophageal reflux disease)    takes Nexium Daily  . Hypertension   . Seasonal allergies    takes zyrtec    Past Surgical History:  Procedure Laterality Date  . ABDOMINAL HYSTERECTOMY     fibroids  . CERVICAL SPINE SURGERY  2002   [facility], ruptured disc  . CHOLECYSTECTOMY  2001   laparoscopic, [facility]  . ESOPHAGOGASTRODUODENOSCOPY (EGD) WITH ESOPHAGEAL DILATION N/A 09/05/2012   LOV:FIEPP hiatal hernia s/p esophageal dilation with 28 F Maloney.   Marland Kitchen SPINE SURGERY    . TUBAL LIGATION    . VAGINAL HYSTERECTOMY  10/19/2010   Procedure: HYSTERECTOMY VAGINAL;  Surgeon: [name], MD;  Location: [facility] ORS;  Service: Gynecology;  Laterality: N/A;    Prior to Admission medications   Medication Sig Start Date End Date Taking? Authorizing Provider  AMITIZA 24 MCG capsule TAKE 1 CAPSULE TWICE DAILY WITH MEALS. 05/10/17  Yes Mahala Menghini, PA-C  baclofen (LIORESAL) 10 MG tablet Take 1 tablet (10 mg total) by mouth 3 (three) times daily. Patient taking differently: Take 10 mg by mouth 3 (three) times daily as needed for muscle spasms.  04/05/17  Yes Raylene Everts, MD  carvedilol (COREG) 25 MG tablet Take 25 mg by mouth 2 (two) times daily with a meal.    Yes [provider]  cetirizine (ZYRTEC) 10 MG tablet Take 1 tablet (10 mg total) by mouth daily. 06/22/17  Yes Raylene Everts, MD  dexlansoprazole  (DEXILANT) 60 MG capsule TAKE (1) CAPSULE BY MOUTH ONCE DAILY. Patient taking differently: Take 60 mg by mouth daily as needed (for acid reflux.). TAKE (1) CAPSULE BY MOUTH ONCE DAILY. 03/19/17  Yes Annitta Needs, NP  dicyclomine (BENTYL) 10 MG capsule Take 1 capsule (10 mg total) by mouth 4 (four) times daily -  before meals and at bedtime. Patient taking differently: Take 10 mg by mouth 4 (four) times daily as needed for spasms.  05/23/17  Yes Annitta Needs, NP  digoxin (LANOXIN) 0.125 MG tablet Take 1 tablet (125 mcg total) by mouth daily. 06/22/17  Yes Raylene Everts, MD  fluticasone [facility]) 50 MCG/ACT nasal spray Place 1 spray daily into both nostrils. Patient taking differently: Place 1 spray into both nostrils 2 (two) times daily.  02/12/17  Yes Raylene Everts, MD  Fluticasone-Salmeterol (ADVAIR) 500-50 MCG/DOSE AEPB Inhale 1 puff into the lungs 2 (two) times daily.   Yes [provider]  furosemide (LASIX) 20 MG tablet Take 20 mg by mouth daily.    Yes [provider]  gabapentin (NEURONTIN) 300 MG capsule Take 1 capsule (300 mg total) 3 (three) times daily by mouth. Patient taking differently: Take 300 mg by mouth 3 (three) times daily as needed (for pain.).  02/19/17  Yes Raylene Everts, MD  losartan (COZAAR) 50 MG tablet Take 1 tablet (50 mg total) by  mouth daily. 06/22/17  Yes Raylene Everts, MD  montelukast (SINGULAIR) 10 MG tablet Take 10 mg by mouth daily.    Yes [provider]  promethazine (PHENERGAN) 25 MG tablet Take 0.5 tablets (12.5 mg total) by mouth every 6 (six) hours as needed for nausea or vomiting. To 1 tablet as needed 05/23/17  Yes Annitta Needs, NP  pantoprazole (PROTONIX) 40 MG tablet Take 1 tablet (40 mg total) by mouth daily. 05/23/17   Annitta Needs, NP    Allergies as of 06/05/2017  . (No Known Allergies)    Family History  Problem Relation Age of Onset  . Diabetes Mother   . Hypertension Mother   . Congestive Heart  Failure Father 69  . Arthritis Father   . Dementia Maternal Grandmother   . Congestive Heart Failure Paternal Grandmother   . Congestive Heart Failure Paternal Grandfather   . Cancer Maternal Uncle   . Anesthesia problems Neg Hx   . Hypotension Neg Hx   . Pseudochol deficiency Neg Hx   . Malignant hyperthermia Neg Hx   . Colon cancer Neg Hx   . Colon polyps Neg Hx     Social History   Socioeconomic History  . Marital status: Single    Spouse name: Not on file  . Number of children: 2  . Years of education: 39  . Highest education level: Not on file  Occupational History  . Occupation: disabled  Social Needs  . Financial resource strain: Not on file  . Food insecurity:    Worry: Not on file    Inability: Not on file  . Transportation needs:    Medical: Not on file    Non-medical: Not on file  Tobacco Use  . Smoking status: Former Smoker    Packs/day: 0.25    Years: 4.00    Pack years: 1.00    Types: Cigarettes    Last attempt to quit: 03/14/2009    Years since quitting: 8.3  . Smokeless tobacco: Never Used  Substance and Sexual Activity  . Alcohol use: No  . Drug use: No  . Sexual activity: Yes    Birth control/protection: Surgical    Comment: hyst  Lifestyle  . Physical activity:    Days per week: Not on file    Minutes per session: Not on file  . Stress: Not on file  Relationships  . Social connections:    Talks on phone: Not on file    Gets together: Not on file    Attends religious service: Not on file    Active member of club or organization: Not on file    Attends meetings of clubs or organizations: Not on file    Relationship status: Not on file  . Intimate partner violence:    Fear of current or ex partner: Not on file    Emotionally abused: Not on file    Physically abused: Not on file    Forced sexual activity: Not on file  Other Topics Concern  . Not on file  Social History Narrative   College degree data entry   Currently denied  disability, patient feels unable to work   Lives with 2 children    Review of Systems: See HPI, otherwise negative ROS  Physical Exam: BP 137/85   Pulse 82   Temp 98.5 F (36.9 C) (Oral)   Resp 14   LMP 07/04/2010   SpO2 99%  General:   Alert,  Well-developed, well-nourished, pleasant  and cooperative in NAD Neck:  Supple; no masses or thyromegaly. No significant cervical adenopathy. Lungs:  Clear throughout to auscultation.   No wheezes, crackles, or rhonchi. No acute distress. Heart:  Regular rate and rhythm; no murmurs, clicks, rubs,  or gallops. Abdomen: Non-distended, normal bowel sounds.  Soft and nontender without appreciable mass or hepatosplenomegaly.  Pulses:  Normal pulses noted. Extremities:  Without clubbing or edema.  Impression:  47 y/o female with chronic diarrhea and dysphagia.  Here for EGD/ED and TCS.  Recommendations:  EGD/ED and TCS per plan.  The risks, benefits, limitations, imponderables and alternatives regarding both EGD and colonoscopy have been reviewed with the patient. Questions have been answered. All parties agreeable.      Notice: This dictation was prepared with Dragon dictation along with smaller phrase technology. Any transcriptional errors that result from this process are unintentional and may not be corrected upon review.

## 2017-07-12 NOTE — Transfer of Care (Signed)
Immediate Anesthesia Transfer of Care Note  Patient: Destiny Manning  Procedure(s) Performed: COLONOSCOPY WITH PROPOFOL (N/A ) ESOPHAGOGASTRODUODENOSCOPY (EGD) WITH PROPOFOL (N/A ) MALONEY DILATION (N/A ) POLYPECTOMY  Patient Location: PACU  Anesthesia Type:MAC  Level of Consciousness: awake and alert   Airway & Oxygen Therapy: Patient Spontanous Breathing and Patient connected to nasal cannula oxygen  Post-op Assessment: Report given to RN and Post -op Vital signs reviewed and stable  Post vital signs: Reviewed and stable  Last Vitals:  Vitals Value Taken Time  BP    Temp    Pulse 29 07/12/2017  9:12 AM  Resp    SpO2 92 % 07/12/2017  9:12 AM  Vitals shown include unvalidated device data.  Last Pain:  Vitals:   07/12/17 0820  TempSrc:   PainSc: 0-No pain      Patients Stated Pain Goal: 5 (30/14/99 6924)  Complications: No apparent anesthesia complications

## 2017-07-13 ENCOUNTER — Encounter: Payer: Self-pay | Admitting: Internal Medicine

## 2017-07-16 ENCOUNTER — Encounter: Payer: Self-pay | Admitting: Family Medicine

## 2017-07-16 DIAGNOSIS — D126 Benign neoplasm of colon, unspecified: Secondary | ICD-10-CM | POA: Insufficient documentation

## 2017-07-17 ENCOUNTER — Encounter (HOSPITAL_COMMUNITY): Payer: Self-pay | Admitting: Internal Medicine

## 2017-07-24 ENCOUNTER — Ambulatory Visit: Payer: Medicaid Other | Admitting: Cardiology

## 2017-07-26 ENCOUNTER — Encounter: Payer: Self-pay | Admitting: Family Medicine

## 2017-07-26 ENCOUNTER — Other Ambulatory Visit: Payer: Self-pay

## 2017-07-26 ENCOUNTER — Ambulatory Visit (INDEPENDENT_AMBULATORY_CARE_PROVIDER_SITE_OTHER): Payer: Medicaid Other | Admitting: Family Medicine

## 2017-07-26 VITALS — BP 130/70 | HR 96 | Temp 98.9°F | Resp 12 | Ht 63.0 in | Wt 146.0 lb

## 2017-07-26 DIAGNOSIS — K219 Gastro-esophageal reflux disease without esophagitis: Secondary | ICD-10-CM

## 2017-07-26 DIAGNOSIS — O903 Peripartum cardiomyopathy: Secondary | ICD-10-CM

## 2017-07-26 DIAGNOSIS — M545 Low back pain, unspecified: Secondary | ICD-10-CM

## 2017-07-26 DIAGNOSIS — I10 Essential (primary) hypertension: Secondary | ICD-10-CM

## 2017-07-26 DIAGNOSIS — G8929 Other chronic pain: Secondary | ICD-10-CM

## 2017-07-26 NOTE — Patient Instructions (Addendum)
Need reports for disability evaluations.  Consent has been signed and the results not received.    Stay on same medicine  Call Dr Anastasio Champion for appointment I will make sure you have refills

## 2017-07-26 NOTE — Progress Notes (Signed)
Chief Complaint  Patient presents with  . Hypertension   Patient is here for routine follow-up. She is under the care of a cardiologist in [place].  She is doing well.  No shortness of breath or chest pain. She has a history of asthma.  She is using her inhalers.  She is doing well.  Has not needed rescue inhalers. She has allergies.  She uses Flonase, Singulair, and Zyrtec.  Her allergies have been well controlled with the season. Her appetite is good.  No problems with bowels.  She had dysphasia.  She had a stretching procedure earlier this month.  She now swallows normally.  She had chronic diarrhea.  Her colonoscopy revealed a tubular adenoma.  She is due for repeat in 5 years.  Her diarrhea has resolved. She complains of chronic neck and back pain.  She had an injury to her neck resulting in cervical disc surgery at the age of 89.  She states that her neck still bothers her, and her upper shoulders.  No numbness or weakness into the arms.  She complains of back pain, and MRI performed in May 2018 was negative. Patient states that she went for disability evaluations.  She has been denied twice.  I asked what her disabling condition was.  She states that it is anxiety.  this is Surprise to me she has never complained of depression or anxiety to me.  PHQ score is 0.  She is not under the care of a counselor.  She is not taking any mood altering drugs or anxiety medicines.  She states that she is fine, no, because she is not working.  She states when she is working she becomes upset that she cannot continue.  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  I explained to her that with this training she could sit and perform activities, and could even work from home.  She states that she had a psychiatric evaluation by a provider locally, and her disability was denied. Patient Active Problem List   Diagnosis Date Noted  . Tubular adenoma of colon 07/16/2017  .  Diarrhea 05/23/2017  . Personal history of noncompliance with medical treatment 04/05/2017  . CAD in native artery 02/21/2017  . 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 Hypertension 11/16/2016  . Seasonal allergies 11/16/2016  . Asthma in adult, mild persistent, uncomplicated 40/34/7425  . Chronic lower back pain 11/16/2016  . H/O cervical spine surgery 11/16/2016  . GERD (gastroesophageal reflux disease) 08/13/2012    Outpatient Encounter Medications as of 07/26/2017  Medication Sig  . AMITIZA 24 MCG capsule TAKE 1 CAPSULE TWICE DAILY WITH MEALS.  . baclofen (LIORESAL) 10 MG tablet Take 1 tablet (10 mg total) by mouth 3 (three) times daily. (Patient taking differently: Take 10 mg by mouth 3 (three) times daily as needed for muscle spasms. )  . carvedilol (COREG) 25 MG tablet Take 25 mg by mouth 2 (two) times daily with a meal.   . cetirizine (ZYRTEC) 10 MG tablet Take 1 tablet (10 mg total) by mouth daily.  . digoxin (LANOXIN) 0.125 MG tablet Take 1 tablet (125 mcg total) by mouth daily.  . fluticasone (FLONASE) 50 MCG/ACT nasal spray Place 1 spray daily into both nostrils. (Patient taking differently: Place 1 spray into both nostrils 2 (two) times daily. )  . Fluticasone-Salmeterol (ADVAIR) 500-50 MCG/DOSE AEPB Inhale 1 puff into the lungs 2 (two) times daily.  . furosemide (LASIX)  20 MG tablet Take 20 mg by mouth daily.   Marland Kitchen gabapentin (NEURONTIN) 300 MG capsule Take 1 capsule (300 mg total) 3 (three) times daily by mouth. (Patient taking differently: Take 300 mg by mouth 3 (three) times daily as needed (for pain.). )  . losartan (COZAAR) 50 MG tablet Take 1 tablet (50 mg total) by mouth daily.  . montelukast (SINGULAIR) 10 MG tablet Take 10 mg by mouth daily.   . pantoprazole (PROTONIX) 40 MG tablet Take 1 tablet (40 mg total) by mouth daily.  . promethazine (PHENERGAN) 25 MG tablet Take 0.5 tablets (12.5 mg total) by mouth every 6 (six) hours as needed for nausea or vomiting. To 1  tablet as needed   No facility-administered encounter medications on file as of 07/26/2017.     No Known Allergies  Review of Systems  Constitutional: Negative for activity change, appetite change and unexpected weight change.  HENT: Negative for congestion, dental problem, postnasal drip and rhinorrhea.   Eyes: Negative for redness and visual disturbance.  Respiratory: Negative for cough and shortness of breath.   Cardiovascular: Negative for chest pain, palpitations and leg swelling.  Gastrointestinal: Negative for abdominal pain, constipation and diarrhea.  Genitourinary: Negative for difficulty urinating and frequency.  Musculoskeletal: Positive for back pain, neck pain and neck stiffness. Negative for arthralgias and gait problem.  Neurological: Negative for dizziness, light-headedness and headaches.  Psychiatric/Behavioral: Negative for dysphoric mood and sleep disturbance. The patient is not nervous/anxious.     Physical Exam  Constitutional: She is oriented to person, place, and time. She appears well-developed and well-nourished.  No discomfort.  Sits, stands, and walks without apparent limitation  HENT:  Head: Normocephalic and atraumatic.  Mouth/Throat: Oropharynx is clear and moist.  Eyes: Pupils are equal, round, and reactive to light. Conjunctivae are normal.  Neck: Normal range of motion. Neck supple. No thyromegaly present.  Cardiovascular: Normal rate, regular rhythm and normal heart sounds.  Pulmonary/Chest: Effort normal and breath sounds normal. No respiratory distress.  No wheezing  Abdominal: Soft. Bowel sounds are normal.  Musculoskeletal: Normal range of motion. She exhibits no edema.  Lumbar spine is straight and symmetric. Full range of motion. No tenderness or muscle spasm. Strength, sensation, range of motion, and reflexes are normal in both lower extremities. Straight leg raise is negative bilateral.   Lymphadenopathy:    She has no cervical adenopathy.   Neurological: She is alert and oriented to person, place, and time. She displays normal reflexes.  Gait normal  Skin: Skin is warm and dry.  Psychiatric: She has a normal mood and affect. Her behavior is normal. Thought content normal.  Nursing note and vitals reviewed.   BP 130/70   Pulse 96   Temp 98.9 F (37.2 C) (Oral)   Resp 12   Ht 5\' 3"  (1.6 m)   Wt 146 lb 0.6 oz (66.2 kg)   LMP 07/04/2010   SpO2 98%   BMI 25.87 kg/m     ASSESSMENT/PLAN:  1. Essential hypertension Controlled  2. 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  Under care of cardiology.  No chest pain or dyspnea  3. Gastroesophageal reflux disease, esophagitis presence not specified Previous complaint of dysphagia has resolved with stretching of esophagus procedure.  She feels good now.  4. Chronic midline low back pain without sciatica Chronic complaint.  Normal MRI.  Conservative care appropriate.   Patient Instructions  Need reports for disability evaluations.  Consent has been  signed and the results not received.    Stay on same medicine  Call Dr Anastasio Champion for appointment I will make sure you have refills     Raylene Everts, MD

## 2017-08-09 ENCOUNTER — Other Ambulatory Visit: Payer: Self-pay | Admitting: Family Medicine

## 2017-08-23 ENCOUNTER — Ambulatory Visit: Payer: Medicaid Other | Admitting: Gastroenterology

## 2017-08-23 ENCOUNTER — Encounter: Payer: Self-pay | Admitting: Gastroenterology

## 2017-08-23 VITALS — BP 111/68 | HR 84 | Temp 97.4°F | Ht 63.0 in | Wt 143.0 lb

## 2017-08-23 DIAGNOSIS — R1310 Dysphagia, unspecified: Secondary | ICD-10-CM | POA: Diagnosis not present

## 2017-08-23 DIAGNOSIS — K5900 Constipation, unspecified: Secondary | ICD-10-CM

## 2017-08-23 DIAGNOSIS — D126 Benign neoplasm of colon, unspecified: Secondary | ICD-10-CM

## 2017-08-23 MED ORDER — LUBIPROSTONE 24 MCG PO CAPS: ORAL_CAPSULE | ORAL | 3 refills | Status: DC

## 2017-08-23 MED ORDER — DEXLANSOPRAZOLE 60 MG PO CPDR: 60.0000 mg | DELAYED_RELEASE_CAPSULE | Freq: Every day | ORAL | 3 refills | Status: DC

## 2017-08-23 NOTE — Progress Notes (Addendum)
-      Referring Provider: Raylene Everts, MD Primary Care Physician:  No primary care provider on file.  Primary GI: Dr. Gala Romney   Chief Complaint  Patient presents with  . Diarrhea    has improved  . Gastroesophageal Reflux    c/o nausea. protonix did not help    HPI:   Destiny Manning is a 47 y.o. female presenting today with a history of constipation, previously on Amitiza. Had episode of diarrhea, but this resolved. She is back on Amitiza 24 mcg. Underwent initial screening colonoscopy recently and will need surveillance in 5 years due to adenoma. Negative colonic biopsies (done for diarrhea). Empiric dilation of esophagus at time of EGD. Normal appearing duodenum. No biopsies.   Back on Dexilant. Still occasionally feels like food gets stuck in lower esophagus with solid foods. Back on Amitiza. Does not want to pursue additional testing at this time but will let us know if continues. Feels like overall, dysphagia has improved. BPE last done in 2014. Occasional reflux in the late afternoon.   Past Medical History:  Diagnosis Date  . Asthma dx 06/04/07   ventolin, singulair  . Blood transfusion without reported diagnosis   . CHF (congestive heart failure) ([place]) 03/19/08   EF 35-40%  . DCM (dilated cardiomyopathy) ([place]) dx 06/06/07   EF 10-15%  . Degeneration of spine   . DJD (degenerative joint disease), lumbar 11/16/2016  . GERD (gastroesophageal reflux disease)    takes Nexium Daily  . Hypertension   . Seasonal allergies    takes zyrtec    Past Surgical History:  Procedure Laterality Date  . ABDOMINAL HYSTERECTOMY     fibroids  . CERVICAL SPINE SURGERY  2002   [facility], ruptured disc  . CHOLECYSTECTOMY  2001   laparoscopic, [facility]  . COLONOSCOPY WITH PROPOFOL N/A 07/12/2017   tubular adenoma (10 mm polyp in cecum), segmental biopsies negative. 5 year surveillance  . ESOPHAGOGASTRODUODENOSCOPY (EGD) WITH ESOPHAGEAL DILATION N/A 09/05/2012   JSE:GBTDV hiatal hernia s/p  esophageal dilation with 68 F Maloney.   . ESOPHAGOGASTRODUODENOSCOPY (EGD) WITH PROPOFOL N/A 07/12/2017   normal esophagus s/p dilation, normal duodenal bulb and second portion of duodenum  . MALONEY DILATION N/A 07/12/2017   Procedure: [name] DILATION;  Surgeon: [name], MD;  Location: [facility] SU[facility];  Service: Endoscopy;  Laterality: N/A;  . POLYPECTOMY  07/12/2017   Procedure: POLYPECTOMY;  Surgeon: [name], MD;  Location: [facility] SU[facility];  Service: Endoscopy;;  Cecal polyp (HS)  . SPINE SURGERY    . TUBAL LIGATION    . VAGINAL HYSTERECTOMY  10/19/2010   Procedure: HYSTERECTOMY VAGINAL;  Surgeon: [name], MD;  Location: [facility] ORS;  Service: Gynecology;  Laterality: N/A;    Current Outpatient Medications  Medication Sig Dispense Refill  . AMITIZA 24 MCG capsule TAKE 1 CAPSULE TWICE DAILY WITH MEALS. 60 capsule 11  . baclofen (LIORESAL) 10 MG tablet Take 1 tablet (10 mg total) by mouth 3 (three) times daily. (Patient taking differently: Take 10 mg by mouth 3 (three) times daily as needed for muscle spasms. ) 90 each 1  . carvedilol (COREG) 25 MG tablet Take 25 mg by mouth 2 (two) times daily with a meal.     . cetirizine (ZYRTEC) 10 MG tablet Take 1 tablet (10 mg total) by mouth daily. 30 tablet 1  . Dexlansoprazole (DEXILANT PO) Take 1 tablet by mouth daily.    . digoxin (LANOXIN) 0.125 MG tablet  Take 1 tablet (125 mcg total) by mouth daily. 30 tablet 1  . fluticasone (FLONASE) 50 MCG/ACT nasal spray Place 1 spray daily into both nostrils. (Patient taking differently: Place 1 spray into both nostrils 2 (two) times daily. ) 16 g 3  . Fluticasone-Salmeterol (ADVAIR) 500-50 MCG/DOSE AEPB Inhale 1 puff into the lungs 2 (two) times daily.    . furosemide (LASIX) 20 MG tablet Take 20 mg by mouth daily.     Marland Kitchen gabapentin (NEURONTIN) 300 MG capsule Take 1 capsule (300 mg total) 3 (three) times daily by mouth. (Patient taking differently: Take 300 mg by mouth 3 (three) times daily as  needed (for pain.). ) 90 capsule 3  . losartan (COZAAR) 50 MG tablet Take 1 tablet (50 mg total) by mouth daily. 30 tablet 1  . montelukast (SINGULAIR) 10 MG tablet Take 10 mg by mouth daily.     . pantoprazole (PROTONIX) 40 MG tablet Take 1 tablet (40 mg total) by mouth daily. 30 tablet 3  . promethazine (PHENERGAN) 25 MG tablet Take 0.5 tablets (12.5 mg total) by mouth every 6 (six) hours as needed for nausea or vomiting. To 1 tablet as needed 30 tablet 0   No current facility-administered medications for this visit.     Allergies as of 08/23/2017  . (No Known Allergies)    Family History  Problem Relation Age of Onset  . Diabetes Mother   . Hypertension Mother   . Congestive Heart Failure Father 73  . Arthritis Father   . Dementia Maternal Grandmother   . Congestive Heart Failure Paternal Grandmother   . Congestive Heart Failure Paternal Grandfather   . Cancer Maternal Uncle   . Anesthesia problems Neg Hx   . Hypotension Neg Hx   . Pseudochol deficiency Neg Hx   . Malignant hyperthermia Neg Hx   . Colon cancer Neg Hx   . Colon polyps Neg Hx     Social History   Socioeconomic History  . Marital status: Single    Spouse name: Not on file  . Number of children: 2  . Years of education: 16  . Highest education level: Not on file  Occupational History  . Occupation: disabled  Social Needs  . Financial resource strain: Not on file  . Food insecurity:    Worry: Not on file    Inability: Not on file  . Transportation needs:    Medical: Not on file    Non-medical: Not on file  Tobacco Use  . Smoking status: Former Smoker    Packs/day: 0.25    Years: 4.00    Pack years: 1.00    Types: Cigarettes    Last attempt to quit: 03/14/2009    Years since quitting: 8.4  . Smokeless tobacco: Never Used  Substance and Sexual Activity  . Alcohol use: No  . Drug use: No  . Sexual activity: Yes    Birth control/protection: Surgical    Comment: hyst  Lifestyle  . Physical  activity:    Days per week: Not on file    Minutes per session: Not on file  . Stress: Not on file  Relationships  . Social connections:    Talks on phone: Not on file    Gets together: Not on file    Attends religious service: Not on file    Active member of club or organization: Not on file    Attends meetings of clubs or organizations: Not on file    Relationship status:  Not on file  Other Topics Concern  . Not on file  Social History Narrative   College degree data entry   Currently denied disability, patient feels unable to work   Lives with 2 children    Review of Systems: Gen: Denies fever, chills, anorexia. Denies fatigue, weakness, weight loss.  CV: Denies chest pain, palpitations, syncope, peripheral edema, and claudication. Resp: Denies dyspnea at rest, cough, wheezing, coughing up blood, and pleurisy. GI: see HPI  Derm: Denies rash, itching, dry skin Psych: Denies depression, anxiety, memory loss, confusion. No homicidal or suicidal ideation.  Heme: Denies bruising, bleeding, and enlarged lymph nodes.  Physical Exam: BP 111/68   Pulse 84   Temp (!) 97.4 F (36.3 C) (Oral)   Ht 5\' 3"  (1.6 m)   Wt 143 lb (64.9 kg)   LMP 07/04/2010   BMI 25.33 kg/m  General:   Alert and oriented. No distress noted. Pleasant and cooperative.  Head:  Normocephalic and atraumatic. Eyes:  Conjuctiva clear without scleral icterus. Mouth:  Oral mucosa pink and moist.  Abdomen:  +BS, soft, non-tender and non-distended. No rebound or guarding. No HSM or masses noted. Msk:  Symmetrical without gross deformities. Normal posture. Extremities:  Without edema. Neurologic:  Alert and  oriented x4 Psych:  Alert and cooperative. Normal mood and affect.

## 2017-08-23 NOTE — Assessment & Plan Note (Signed)
Surveillance due in 5 years.

## 2017-08-23 NOTE — Patient Instructions (Signed)
Continue Dexilant daily.  Continue Amitiza twice a day with food.   You can take Zantac as needed in the evenings.  If you have further issues with swallowing, please let me know! The first step would be the xray.  I will see you in 6-8 months!  It was a pleasure to see you today. I strive to create trusting relationships with patients to provide genuine, compassionate, and quality care. I value your feedback. If you receive a survey regarding your visit,  I greatly appreciate you taking time to fill this out.   Annitta Needs, PhD, ANP-BC [facility]

## 2017-08-23 NOTE — Assessment & Plan Note (Signed)
Improved after empiric dilation but still intermittent. No oropharyngeal dysphagia. Last BPE in 2014. Discussed updating this to evaluate any possible evolving motility issue. She would like to hold off on this currently. Continue Dexilant daily, add Zantac only as needed in evenings. Return in 6-8 months

## 2017-08-23 NOTE — Assessment & Plan Note (Signed)
Doing well on Amitiza 24 mcg BID. Refills provided. Return in 6-8 months.

## 2017-08-24 NOTE — Progress Notes (Signed)
No pcp per patient 

## 2017-09-13 DIAGNOSIS — I509 Heart failure, unspecified: Secondary | ICD-10-CM | POA: Diagnosis not present

## 2017-09-13 DIAGNOSIS — E785 Hyperlipidemia, unspecified: Secondary | ICD-10-CM | POA: Diagnosis not present

## 2017-09-13 DIAGNOSIS — I10 Essential (primary) hypertension: Secondary | ICD-10-CM | POA: Diagnosis not present

## 2017-09-13 DIAGNOSIS — I2510 Atherosclerotic heart disease of native coronary artery without angina pectoris: Secondary | ICD-10-CM | POA: Diagnosis not present

## 2017-09-13 DIAGNOSIS — R0602 Shortness of breath: Secondary | ICD-10-CM | POA: Diagnosis not present

## 2017-10-05 ENCOUNTER — Other Ambulatory Visit: Payer: Self-pay | Admitting: Family Medicine

## 2017-10-18 DIAGNOSIS — I10 Essential (primary) hypertension: Secondary | ICD-10-CM | POA: Diagnosis not present

## 2017-10-18 DIAGNOSIS — I420 Dilated cardiomyopathy: Secondary | ICD-10-CM | POA: Diagnosis not present

## 2017-10-18 DIAGNOSIS — M7918 Myalgia, other site: Secondary | ICD-10-CM | POA: Diagnosis not present

## 2017-10-18 DIAGNOSIS — Z13220 Encounter for screening for lipoid disorders: Secondary | ICD-10-CM | POA: Diagnosis not present

## 2017-10-18 DIAGNOSIS — R5383 Other fatigue: Secondary | ICD-10-CM | POA: Diagnosis not present

## 2017-10-18 DIAGNOSIS — Z131 Encounter for screening for diabetes mellitus: Secondary | ICD-10-CM | POA: Diagnosis not present

## 2017-10-18 DIAGNOSIS — M791 Myalgia, unspecified site: Secondary | ICD-10-CM | POA: Diagnosis not present

## 2017-10-24 DIAGNOSIS — R0602 Shortness of breath: Secondary | ICD-10-CM | POA: Diagnosis not present

## 2017-10-24 DIAGNOSIS — R072 Precordial pain: Secondary | ICD-10-CM | POA: Diagnosis not present

## 2017-10-29 DIAGNOSIS — R5383 Other fatigue: Secondary | ICD-10-CM | POA: Diagnosis not present

## 2017-10-29 DIAGNOSIS — I10 Essential (primary) hypertension: Secondary | ICD-10-CM | POA: Diagnosis not present

## 2017-10-29 DIAGNOSIS — K5900 Constipation, unspecified: Secondary | ICD-10-CM | POA: Diagnosis not present

## 2017-11-05 DIAGNOSIS — R002 Palpitations: Secondary | ICD-10-CM | POA: Diagnosis not present

## 2017-11-14 DIAGNOSIS — J320 Chronic maxillary sinusitis: Secondary | ICD-10-CM | POA: Diagnosis not present

## 2017-11-14 DIAGNOSIS — R002 Palpitations: Secondary | ICD-10-CM | POA: Diagnosis not present

## 2017-11-23 DIAGNOSIS — I2510 Atherosclerotic heart disease of native coronary artery without angina pectoris: Secondary | ICD-10-CM | POA: Diagnosis not present

## 2017-11-23 DIAGNOSIS — R0600 Dyspnea, unspecified: Secondary | ICD-10-CM | POA: Diagnosis not present

## 2017-11-23 DIAGNOSIS — J329 Chronic sinusitis, unspecified: Secondary | ICD-10-CM | POA: Diagnosis not present

## 2017-11-23 DIAGNOSIS — I10 Essential (primary) hypertension: Secondary | ICD-10-CM | POA: Diagnosis not present

## 2017-12-05 DIAGNOSIS — G43909 Migraine, unspecified, not intractable, without status migrainosus: Secondary | ICD-10-CM | POA: Diagnosis not present

## 2017-12-05 DIAGNOSIS — M5412 Radiculopathy, cervical region: Secondary | ICD-10-CM | POA: Diagnosis not present

## 2017-12-05 DIAGNOSIS — Z6825 Body mass index (BMI) 25.0-25.9, adult: Secondary | ICD-10-CM | POA: Diagnosis not present

## 2017-12-05 DIAGNOSIS — O903 Peripartum cardiomyopathy: Secondary | ICD-10-CM | POA: Diagnosis not present

## 2017-12-05 DIAGNOSIS — J4540 Moderate persistent asthma, uncomplicated: Secondary | ICD-10-CM | POA: Diagnosis not present

## 2017-12-05 DIAGNOSIS — I10 Essential (primary) hypertension: Secondary | ICD-10-CM | POA: Diagnosis not present

## 2017-12-06 ENCOUNTER — Other Ambulatory Visit (HOSPITAL_COMMUNITY): Payer: Self-pay | Admitting: Internal Medicine

## 2017-12-06 DIAGNOSIS — Z1231 Encounter for screening mammogram for malignant neoplasm of breast: Secondary | ICD-10-CM

## 2017-12-12 ENCOUNTER — Ambulatory Visit (HOSPITAL_COMMUNITY): Payer: Medicaid Other

## 2017-12-12 DIAGNOSIS — I2510 Atherosclerotic heart disease of native coronary artery without angina pectoris: Secondary | ICD-10-CM | POA: Diagnosis not present

## 2017-12-12 DIAGNOSIS — R0602 Shortness of breath: Secondary | ICD-10-CM | POA: Diagnosis not present

## 2017-12-13 ENCOUNTER — Ambulatory Visit (HOSPITAL_COMMUNITY): Payer: Medicaid Other

## 2018-01-04 DIAGNOSIS — I509 Heart failure, unspecified: Secondary | ICD-10-CM | POA: Diagnosis not present

## 2018-01-04 DIAGNOSIS — E785 Hyperlipidemia, unspecified: Secondary | ICD-10-CM | POA: Diagnosis not present

## 2018-01-04 DIAGNOSIS — I10 Essential (primary) hypertension: Secondary | ICD-10-CM | POA: Diagnosis not present

## 2018-01-04 DIAGNOSIS — I2510 Atherosclerotic heart disease of native coronary artery without angina pectoris: Secondary | ICD-10-CM | POA: Diagnosis not present

## 2018-01-21 ENCOUNTER — Ambulatory Visit (HOSPITAL_COMMUNITY)
Admission: RE | Admit: 2018-01-21 | Discharge: 2018-01-21 | Disposition: A | Discharge status: Home / Self Care | Payer: Medicaid Other | Source: Ambulatory Visit | Attending: Internal Medicine | Admitting: Internal Medicine

## 2018-01-21 DIAGNOSIS — Z1231 Encounter for screening mammogram for malignant neoplasm of breast: Secondary | ICD-10-CM

## 2018-02-08 DIAGNOSIS — J00 Acute nasopharyngitis [common cold]: Secondary | ICD-10-CM | POA: Diagnosis not present

## 2018-02-13 ENCOUNTER — Ambulatory Visit: Payer: Medicaid Other | Admitting: Gastroenterology

## 2018-02-15 ENCOUNTER — Encounter: Payer: Self-pay | Admitting: Obstetrics and Gynecology

## 2018-02-15 ENCOUNTER — Ambulatory Visit: Payer: Medicaid Other | Admitting: Obstetrics and Gynecology

## 2018-02-15 VITALS — BP 131/76 | HR 82 | Ht 63.0 in | Wt 145.4 lb

## 2018-02-15 DIAGNOSIS — A5901 Trichomonal vulvovaginitis: Secondary | ICD-10-CM | POA: Diagnosis not present

## 2018-02-15 DIAGNOSIS — N898 Other specified noninflammatory disorders of vagina: Secondary | ICD-10-CM

## 2018-02-15 LAB — POCT WET PREP WITH KOH
CLUE CELLS WET PREP PER HPF POC: NEGATIVE
Trichomonas, UA: POSITIVE

## 2018-02-15 MED ORDER — METRONIDAZOLE 500 MG PO TABS: 500.0000 mg | ORAL_TABLET | Freq: Two times a day (BID) | ORAL | 1 refills | Status: DC

## 2018-02-15 NOTE — Patient Instructions (Signed)

## 2018-02-15 NOTE — Progress Notes (Addendum)
Patient ID: Destiny Manning, female   DOB: 04-Nov-1970, 47 y.o.   MRN: [id number]    [facility] Visit  @DATE @            Patient name: Destiny Manning MRN [id number]  Date of birth: [birth year]  CC & HPI:  Destiny Manning is a 47 y.o. female presenting today for vaginal discharge Was sick about 6 weeks ago, cold related and was put on strong antibiotics and says knocked her ph balance. Took over the counter medication over 2 weeks ago. Is not currently sexually active. Hasn't had yeast in years and had a hysterectomy. Delivered both children vaginally.  ROS:  ROS +vaginal discharge -fever -chills All systems are negative except as noted in the HPI and PMH.  Pertinent History Reviewed:   Reviewed: Medical         Past Medical History:  Diagnosis Date  . Asthma dx 06/04/07   ventolin, singulair  . Blood transfusion without reported diagnosis   . CHF (congestive heart failure) ([place]) 03/19/08   EF 35-40%  . DCM (dilated cardiomyopathy) ([place]) dx 06/06/07   EF 10-15%  . Degeneration of spine   . DJD (degenerative joint disease), lumbar 11/16/2016  . GERD (gastroesophageal reflux disease)    takes Nexium Daily  . Hypertension   . Seasonal allergies    takes zyrtec                              Surgical Hx:    Past Surgical History:  Procedure Laterality Date  . ABDOMINAL HYSTERECTOMY     fibroids  . CERVICAL SPINE SURGERY  2002   [facility], ruptured disc  . CHOLECYSTECTOMY  2001   laparoscopic, [facility]  . COLONOSCOPY WITH PROPOFOL N/A 07/12/2017   tubular adenoma (10 mm polyp in cecum), segmental biopsies negative. 5 year surveillance  . ESOPHAGOGASTRODUODENOSCOPY (EGD) WITH ESOPHAGEAL DILATION N/A 09/05/2012   [id number] hiatal hernia s/p esophageal dilation with 65 F Maloney.   . ESOPHAGOGASTRODUODENOSCOPY (EGD) WITH PROPOFOL N/A 07/12/2017   normal esophagus s/p dilation, normal duodenal bulb and second portion of duodenum  . MALONEY DILATION N/A 07/12/2017   Procedure: [name]  DILATION;  Surgeon: [name], MD;  [facility];  Service: Endoscopy;  Laterality: N/A;  . POLYPECTOMY  07/12/2017   Procedure: POLYPECTOMY;  Surgeon: [name], MD;  Location: [facility];  Service: Endoscopy;;  Cecal polyp (HS)  . SPINE SURGERY    . TUBAL LIGATION    . VAGINAL HYSTERECTOMY  10/19/2010   Procedure: HYSTERECTOMY VAGINAL;  Surgeon: [name], MD;  Location: [facility] ORS;  Service: Gynecology;  Laterality: N/A;   Medications: Reviewed & Updated - see associated section                       Current Outpatient Medications:  .  baclofen (LIORESAL) 10 MG tablet, Take 1 tablet (10 mg total) by mouth 3 (three) times daily. (Patient taking differently: Take 10 mg by mouth 3 (three) times daily as needed for muscle spasms. ), Disp: 90 each, Rfl: 1 .  carvedilol (COREG) 25 MG tablet, Take 25 mg by mouth 2 (two) times daily with a meal. , Disp: , Rfl:  .  cetirizine (ZYRTEC) 10 MG tablet, Take 1 tablet (10 mg total) by mouth daily., Disp: 30 tablet, Rfl: 1 .  dexlansoprazole (DEXILANT) 60 MG capsule, Take 1  capsule (60 mg total) by mouth daily., Disp: 90 capsule, Rfl: 3 .  digoxin (LANOXIN) 0.125 MG tablet, Take 1 tablet (125 mcg total) by mouth daily., Disp: 30 tablet, Rfl: 1 .  fluticasone (FLONASE) 50 MCG/ACT nasal spray, Place 1 spray daily into both nostrils. (Patient taking differently: Place 1 spray into both nostrils 2 (two) times daily. ), Disp: 16 g, Rfl: 3 .  Fluticasone-Salmeterol (ADVAIR) 500-50 MCG/DOSE AEPB, Inhale 1 puff into the lungs 2 (two) times daily., Disp: , Rfl:  .  furosemide (LASIX) 20 MG tablet, Take 20 mg by mouth daily. , Disp: , Rfl:  .  gabapentin (NEURONTIN) 300 MG capsule, Take 1 capsule (300 mg total) 3 (three) times daily by mouth. (Patient taking differently: Take 300 mg by mouth 3 (three) times daily as needed (for pain.). ), Disp: 90 capsule, Rfl: 3 .  losartan (COZAAR) 50 MG tablet, Take 1 tablet (50 mg total) by mouth daily.,  Disp: 30 tablet, Rfl: 1 .  lubiprostone (AMITIZA) 24 MCG capsule, TAKE 1 CAPSULE TWICE DAILY WITH MEALS., Disp: 180 capsule, Rfl: 3 .  montelukast (SINGULAIR) 10 MG tablet, Take 10 mg by mouth daily. , Disp: , Rfl:  .  promethazine (PHENERGAN) 25 MG tablet, Take 0.5 tablets (12.5 mg total) by mouth every 6 (six) hours as needed for nausea or vomiting. To 1 tablet as needed, Disp: 30 tablet, Rfl: 0   Social History: Reviewed -  reports that she quit smoking about 8 years ago. Her smoking use included cigarettes. She has a 1.00 pack-year smoking history. She has never used smokeless tobacco.  Objective Findings:  Vitals: Last menstrual period 07/04/2010.  PHYSICAL EXAMINATION General appearance - alert, well appearing, and in no distress and oriented to person, place, and time Mental status - alert, oriented to person, place, and time, normal mood, behavior, speech, dress, motor activity, and thought processes, affect appropriate to mood  PELVIC Vagina -creamy white discharge  top of vagina well healed after hysterectomy. 2 o'clock on vaginal cuff pigmented which is 1 cm x 1 cm, appears normal Cervix - surgically absent Uterus - surgically absent Wet Mount - pos trick, neg clue and yeast   Assessment & Plan:   A:  1.  Trichomoniasis vaginitis  P:   1. Rx Metronidazole BID x7 days, with refill 2. 3 weeks proof of cure.    By signing my name below, I, Samul Dada, attest that this documentation has been prepared under the direction and in the presence of Jonnie Kind, MD. Electronically Signed: [place]. 02/15/18. 10:57 AM.  I personally performed the services described in this documentation, which was SCRIBED in my presence. The recorded information has been reviewed and considered accurate. It has been edited as necessary during review. Jonnie Kind, MD

## 2018-02-25 DIAGNOSIS — S91209A Unspecified open wound of unspecified toe(s) with damage to nail, initial encounter: Secondary | ICD-10-CM | POA: Diagnosis not present

## 2018-03-06 DIAGNOSIS — B351 Tinea unguium: Secondary | ICD-10-CM | POA: Diagnosis not present

## 2018-03-06 DIAGNOSIS — M79675 Pain in left toe(s): Secondary | ICD-10-CM | POA: Diagnosis not present

## 2018-03-06 DIAGNOSIS — M79674 Pain in right toe(s): Secondary | ICD-10-CM | POA: Diagnosis not present

## 2018-03-14 ENCOUNTER — Ambulatory Visit: Payer: Medicaid Other | Admitting: Obstetrics and Gynecology

## 2018-03-15 DIAGNOSIS — I10 Essential (primary) hypertension: Secondary | ICD-10-CM | POA: Diagnosis not present

## 2018-03-15 DIAGNOSIS — R0600 Dyspnea, unspecified: Secondary | ICD-10-CM | POA: Diagnosis not present

## 2018-03-15 DIAGNOSIS — I509 Heart failure, unspecified: Secondary | ICD-10-CM | POA: Diagnosis not present

## 2018-03-18 ENCOUNTER — Ambulatory Visit: Payer: Medicaid Other | Admitting: Obstetrics and Gynecology

## 2018-03-18 ENCOUNTER — Encounter: Payer: Self-pay | Admitting: Obstetrics and Gynecology

## 2018-03-18 VITALS — BP 121/65 | HR 85 | Ht 63.0 in | Wt 143.0 lb

## 2018-03-18 DIAGNOSIS — Z113 Encounter for screening for infections with a predominantly sexual mode of transmission: Secondary | ICD-10-CM

## 2018-03-18 DIAGNOSIS — Z8619 Personal history of other infectious and parasitic diseases: Secondary | ICD-10-CM

## 2018-03-18 DIAGNOSIS — Z09 Encounter for follow-up examination after completed treatment for conditions other than malignant neoplasm: Secondary | ICD-10-CM

## 2018-03-18 NOTE — Progress Notes (Signed)
Patient ID: BREASIA KARGES, female   DOB: 1971/04/03, 47 y.o.   MRN: [id number]    [facility] Visit  @DATE @            Patient name: Destiny Manning MRN [id number]  Date of birth: [birth year]  CC & HPI:  Destiny Manning is a 47 y.o. female presenting today for POC for Trichomoniasis vaginitis. Is no longer with partner after giving him trich medications. This diagnosis lead up to the separation of her and her partner. Would like to get HIV blood testing done but veins are still sore from recent blood work for heart and has decided to wait.  ROS:  ROS  -vaginal discharge w/ odor -fever -chills All systems are negative except as noted in the HPI and PMH.   Pertinent History Reviewed:   Reviewed: Medical         Past Medical History:  Diagnosis Date  . Asthma dx 06/04/07   ventolin, singulair  . Blood transfusion without reported diagnosis   . CHF (congestive heart failure) ([place]) 03/19/08   EF 35-40%  . DCM (dilated cardiomyopathy) ([place]) dx 06/06/07   EF 10-15%  . Degeneration of spine   . DJD (degenerative joint disease), lumbar 11/16/2016  . GERD (gastroesophageal reflux disease)    takes Nexium Daily  . Hypertension   . Seasonal allergies    takes zyrtec                              Surgical Hx:    Past Surgical History:  Procedure Laterality Date  . ABDOMINAL HYSTERECTOMY     fibroids  . CERVICAL SPINE SURGERY  2002   [facility], ruptured disc  . CHOLECYSTECTOMY  2001   laparoscopic, [facility]  . COLONOSCOPY WITH PROPOFOL N/A 07/12/2017   tubular adenoma (10 mm polyp in cecum), segmental biopsies negative. 5 year surveillance  . ESOPHAGOGASTRODUODENOSCOPY (EGD) WITH ESOPHAGEAL DILATION N/A 09/05/2012   [id number] hiatal hernia s/p esophageal dilation with 24 F Maloney.   . ESOPHAGOGASTRODUODENOSCOPY (EGD) WITH PROPOFOL N/A 07/12/2017   normal esophagus s/p dilation, normal duodenal bulb and second portion of duodenum  . MALONEY DILATION N/A 07/12/2017   Procedure: [name]  DILATION;  Surgeon: [name], MD;  [facility];  Service: Endoscopy;  Laterality: N/A;  . POLYPECTOMY  07/12/2017   Procedure: POLYPECTOMY;  Surgeon: [name], MD;  [facility];  Service: Endoscopy;;  Cecal polyp (HS)  . SPINE SURGERY    . TUBAL LIGATION    . VAGINAL HYSTERECTOMY  10/19/2010   Procedure: HYSTERECTOMY VAGINAL;  Surgeon: [name], MD;  Location: [facility];  Service: Gynecology;  Laterality: N/A;   Medications: Reviewed & Updated - see associated section                       Current Outpatient Medications:  .  baclofen (LIORESAL) 10 MG tablet, Take 1 tablet (10 mg total) by mouth 3 (three) times daily. (Patient taking differently: Take 10 mg by mouth 3 (three) times daily as needed for muscle spasms. ), Disp: 90 each, Rfl: 1 .  cetirizine (ZYRTEC) 10 MG tablet, Take 1 tablet (10 mg total) by mouth daily., Disp: 30 tablet, Rfl: 1 .  dexlansoprazole (DEXILANT) 60 MG capsule, Take 1 capsule (60 mg total) by mouth daily., Disp: 90 capsule, Rfl: 3 .  digoxin (LANOXIN) 0.125 MG tablet,  Take 1 tablet (125 mcg total) by mouth daily., Disp: 30 tablet, Rfl: 1 .  fluticasone (FLONASE) 50 MCG/ACT nasal spray, Place 1 spray daily into both nostrils. (Patient taking differently: Place 1 spray into both nostrils 2 (two) times daily. ), Disp: 16 g, Rfl: 3 .  Fluticasone-Salmeterol (ADVAIR) 500-50 MCG/DOSE AEPB, Inhale 1 puff into the lungs 2 (two) times daily., Disp: , Rfl:  .  furosemide (LASIX) 20 MG tablet, Take 10 mg by mouth daily. , Disp: , Rfl:  .  gabapentin (NEURONTIN) 300 MG capsule, Take 1 capsule (300 mg total) 3 (three) times daily by mouth. (Patient taking differently: Take 300 mg by mouth 3 (three) times daily as needed (for pain.). ), Disp: 90 capsule, Rfl: 3 .  losartan (COZAAR) 50 MG tablet, Take 1 tablet (50 mg total) by mouth daily., Disp: 30 tablet, Rfl: 1 .  lubiprostone (AMITIZA) 24 MCG capsule, TAKE 1 CAPSULE TWICE DAILY WITH MEALS.,  Disp: 180 capsule, Rfl: 3 .  metroNIDAZOLE (FLAGYL) 500 MG tablet, Take 1 tablet (500 mg total) by mouth 2 (two) times daily., Disp: 14 tablet, Rfl: 1 .  montelukast (SINGULAIR) 10 MG tablet, Take 10 mg by mouth daily. , Disp: , Rfl:  .  promethazine (PHENERGAN) 25 MG tablet, Take 0.5 tablets (12.5 mg total) by mouth every 6 (six) hours as needed for nausea or vomiting. To 1 tablet as needed, Disp: 30 tablet, Rfl: 0   Social History: Reviewed -  reports that she quit smoking about 9 years ago. Her smoking use included cigarettes. She has a 1.00 pack-year smoking history. She has never used smokeless tobacco.  Objective Findings:  Vitals: Last menstrual period 07/04/2010.  PHYSICAL EXAMINATION General appearance - alert, well appearing, and in no distress and oriented to person, place, and time Mental status - alert, oriented to person, place, and time, normal mood, behavior, speech, dress, motor activity, and thought processes, affect appropriate to mood   PELVIC Vagina - normal secretions Cervix - surgically absent Uterus - surgically absent Wet [place] - wet prep normal neg clue, trich, only epithelial cells   Assessment & Plan:   A:  1. POC hx Trichomonas vag 2. STI screening, gc/chl 3. Bloodwork at [place] for HIV testing  P:  1. Call with results 2. Gyn_f/u: PRN, annual physical    By signing my name below, I, Samul Dada, attest that this documentation has been prepared under the direction and in the presence of Jonnie Kind, MD. Electronically Signed: [place]. 03/18/18. 9:00 AM.  I personally performed the services described in this documentation, which was SCRIBED in my presence. The recorded information has been reviewed and considered accurate. It has been edited as necessary during review. Jonnie Kind, MD

## 2018-03-20 LAB — GC/CHLAMYDIA PROBE AMP
CHLAMYDIA, DNA PROBE: NEGATIVE
Neisseria gonorrhoeae by PCR: NEGATIVE

## 2018-04-22 DIAGNOSIS — I509 Heart failure, unspecified: Secondary | ICD-10-CM | POA: Diagnosis not present

## 2018-04-22 DIAGNOSIS — I10 Essential (primary) hypertension: Secondary | ICD-10-CM | POA: Diagnosis not present

## 2018-04-22 DIAGNOSIS — E785 Hyperlipidemia, unspecified: Secondary | ICD-10-CM | POA: Diagnosis not present

## 2018-04-22 DIAGNOSIS — R0602 Shortness of breath: Secondary | ICD-10-CM | POA: Diagnosis not present

## 2018-04-22 DIAGNOSIS — R062 Wheezing: Secondary | ICD-10-CM | POA: Diagnosis not present

## 2018-04-29 ENCOUNTER — Encounter: Payer: Self-pay | Admitting: Internal Medicine

## 2018-04-29 ENCOUNTER — Other Ambulatory Visit: Payer: Self-pay | Admitting: Gastroenterology

## 2018-04-30 ENCOUNTER — Telehealth: Payer: Self-pay

## 2018-04-30 NOTE — Telephone Encounter (Signed)
PA for Dexilant was submitted and approved. PA# [id number].

## 2018-05-13 DIAGNOSIS — J0110 Acute frontal sinusitis, unspecified: Secondary | ICD-10-CM | POA: Diagnosis not present

## 2018-05-29 DIAGNOSIS — E785 Hyperlipidemia, unspecified: Secondary | ICD-10-CM | POA: Diagnosis not present

## 2018-05-29 DIAGNOSIS — R51 Headache: Secondary | ICD-10-CM | POA: Diagnosis not present

## 2018-05-29 DIAGNOSIS — G43909 Migraine, unspecified, not intractable, without status migrainosus: Secondary | ICD-10-CM | POA: Diagnosis not present

## 2018-05-29 DIAGNOSIS — H538 Other visual disturbances: Secondary | ICD-10-CM | POA: Diagnosis not present

## 2018-05-29 DIAGNOSIS — B351 Tinea unguium: Secondary | ICD-10-CM | POA: Diagnosis not present

## 2018-05-29 DIAGNOSIS — O903 Peripartum cardiomyopathy: Secondary | ICD-10-CM | POA: Diagnosis not present

## 2018-05-29 DIAGNOSIS — I10 Essential (primary) hypertension: Secondary | ICD-10-CM | POA: Diagnosis not present

## 2018-05-30 ENCOUNTER — Other Ambulatory Visit (HOSPITAL_COMMUNITY): Payer: Self-pay | Admitting: Internal Medicine

## 2018-05-30 DIAGNOSIS — R51 Headache: Principal | ICD-10-CM

## 2018-05-30 DIAGNOSIS — R519 Headache, unspecified: Secondary | ICD-10-CM

## 2018-06-14 ENCOUNTER — Encounter: Payer: Self-pay | Admitting: Women's Health

## 2018-06-14 ENCOUNTER — Other Ambulatory Visit: Payer: Self-pay

## 2018-06-14 ENCOUNTER — Ambulatory Visit: Payer: Medicaid Other | Admitting: Women's Health

## 2018-06-14 VITALS — BP 155/89 | HR 86 | Ht 63.0 in | Wt 151.0 lb

## 2018-06-14 DIAGNOSIS — B9689 Other specified bacterial agents as the cause of diseases classified elsewhere: Secondary | ICD-10-CM | POA: Diagnosis not present

## 2018-06-14 DIAGNOSIS — N760 Acute vaginitis: Secondary | ICD-10-CM

## 2018-06-14 DIAGNOSIS — R102 Pelvic and perineal pain: Secondary | ICD-10-CM | POA: Diagnosis not present

## 2018-06-14 DIAGNOSIS — N898 Other specified noninflammatory disorders of vagina: Secondary | ICD-10-CM

## 2018-06-14 LAB — POCT WET PREP (WET MOUNT)
Clue Cells Wet Prep Whiff POC: POSITIVE
Trichomonas Wet Prep HPF POC: ABSENT

## 2018-06-14 MED ORDER — METRONIDAZOLE 0.75 % VA GEL: 1.0000 | Freq: Every day | VAGINAL | 0 refills | Status: DC

## 2018-06-14 NOTE — Progress Notes (Signed)
   GYN VISIT Patient name: Destiny Manning MRN [id number]  Date of birth: 10/25/1970 Chief Complaint:   Pelvic Pain (with vaginal discharge; itching; no odor)  History of Present Illness:   Destiny Manning is a 48 y.o. G67P2012 African American female being seen today for bilateral low intermittent pelvic pain, vaginal d/c, vulvar itching x 1wk. No odor. Not sexually active in >72yr     Patient's last menstrual period was 07/04/2010. The current method of family planning is abstinence and status post hysterectomy. Last pap: s/p hysterectomy for fibroids Review of Systems:   Pertinent items are noted in HPI Denies fever/chills, dizziness, headaches, visual disturbances, fatigue, shortness of breath, chest pain, abdominal pain, vomiting, abnormal vaginal discharge/itching/odor/irritation, problems with periods, bowel movements, urination, or intercourse unless otherwise stated above.  Pertinent History Reviewed:  Reviewed past medical,surgical, social, obstetrical and family history.  Reviewed problem list, medications and allergies. Physical Assessment:   Vitals:   06/14/18 0939  BP: (!) 155/89  Pulse: 86  Weight: 151 lb (68.5 kg)  Height: 5\' 3"  (1.6 m)  Body mass index is 26.75 kg/m.       Physical Examination:   General appearance: alert, well appearing, and in no distress  Mental status: alert, oriented to person, place, and time  Skin: warm & dry   Cardiovascular: normal heart rate noted  Respiratory: normal respiratory effort, no distress  Abdomen: soft, non-tender   Pelvic: VULVA: normal appearing vulva with no masses, tenderness or lesions, VAGINA: normal appearing vagina with normal color and mod amt malodorous discharge, no lesions +tenderness/pain to palpation bilateral adnexa  Extremities: no edema   Results for orders placed or performed in visit on 06/14/18 (from the past 24 hour(s))  POCT Wet Prep [facility])   Collection Time: 06/14/18 10:22 AM  Result Value Ref  Range   Source Wet Prep POC vaginal    WBC, Wet Prep HPF POC few    Bacteria Wet Prep HPF POC Few Few   BACTERIA WET PREP MORPHOLOGY POC     Clue Cells Wet Prep HPF POC Many (A) None   Clue Cells Wet Prep Whiff POC Positive Whiff    Yeast Wet Prep HPF POC None None   KOH Wet Prep POC     Trichomonas Wet Prep HPF POC Absent Absent    Assessment & Plan:  1) BV> rx metrogel per pt preference  2) Pelvic pain> bilateral adnexa, if not improved after metrogel, let me know and will get u/s  Meds:  Meds ordered this encounter  Medications  . metroNIDAZOLE (METROGEL VAGINAL) 0.75 % vaginal gel    Sig: Place 1 Applicatorful vaginally at bedtime. X 5 nights, no alcohol or sex while using    Dispense:  70 g    Refill:  0    Order Specific Question:   Supervising Provider    Answer:   Tania Ade H [[id number]]    Orders Placed This Encounter  Procedures  . GC/Chlamydia Probe Amp  . POCT Wet Prep [facility])    No follow-ups on file.  [place], [facility] 06/14/2018 10:25 AM

## 2018-06-16 LAB — GC/CHLAMYDIA PROBE AMP
CHLAMYDIA, DNA PROBE: NEGATIVE
Neisseria gonorrhoeae by PCR: NEGATIVE

## 2018-06-17 ENCOUNTER — Encounter (HOSPITAL_COMMUNITY): Payer: Self-pay

## 2018-06-17 ENCOUNTER — Ambulatory Visit (HOSPITAL_COMMUNITY): Admission: RE | Admit: 2018-06-17 | Payer: Medicaid Other | Source: Ambulatory Visit

## 2018-06-18 DIAGNOSIS — R0600 Dyspnea, unspecified: Secondary | ICD-10-CM | POA: Diagnosis not present

## 2018-06-18 DIAGNOSIS — R062 Wheezing: Secondary | ICD-10-CM | POA: Diagnosis not present

## 2018-06-18 DIAGNOSIS — O903 Peripartum cardiomyopathy: Secondary | ICD-10-CM | POA: Diagnosis not present

## 2018-06-18 DIAGNOSIS — J302 Other seasonal allergic rhinitis: Secondary | ICD-10-CM | POA: Diagnosis not present

## 2018-06-18 DIAGNOSIS — J45909 Unspecified asthma, uncomplicated: Secondary | ICD-10-CM | POA: Diagnosis not present

## 2018-07-09 DIAGNOSIS — M5440 Lumbago with sciatica, unspecified side: Secondary | ICD-10-CM | POA: Diagnosis not present

## 2018-07-09 DIAGNOSIS — M5030 Other cervical disc degeneration, unspecified cervical region: Secondary | ICD-10-CM | POA: Diagnosis not present

## 2018-07-09 DIAGNOSIS — G43909 Migraine, unspecified, not intractable, without status migrainosus: Secondary | ICD-10-CM | POA: Diagnosis not present

## 2018-08-01 ENCOUNTER — Other Ambulatory Visit: Payer: Self-pay

## 2018-08-01 ENCOUNTER — Ambulatory Visit (INDEPENDENT_AMBULATORY_CARE_PROVIDER_SITE_OTHER): Payer: Medicaid Other | Admitting: Nurse Practitioner

## 2018-08-01 ENCOUNTER — Encounter: Payer: Self-pay | Admitting: Nurse Practitioner

## 2018-08-01 DIAGNOSIS — R1310 Dysphagia, unspecified: Secondary | ICD-10-CM

## 2018-08-01 DIAGNOSIS — K219 Gastro-esophageal reflux disease without esophagitis: Secondary | ICD-10-CM

## 2018-08-01 DIAGNOSIS — K5900 Constipation, unspecified: Secondary | ICD-10-CM | POA: Diagnosis not present

## 2018-08-01 NOTE — Assessment & Plan Note (Signed)
Well-controlled on Dexilant.  Continue current medications and follow-up in 1 year.  Call with any worsening before then.

## 2018-08-01 NOTE — Patient Instructions (Signed)
Your health issues we discussed today were:   Constipation: 1. Continue taking Amitiza 24 mcg twice daily 2. Call us if you have any worsening symptoms or severe symptoms  GERD (heartburn/reflux) and dysphagia (swallowing difficulties): 1. I am glad you are not having any further problems at this time 2. Continue taking Dexilant 3. Call us if you have any worsening/severe symptoms or if you have any recurrent swallowing issues  Overall I recommend:  1. Continue other medications 2. Call us if you have any questions or concerns 3. Follow-up in 1 year.   Because of recent events of COVID-19 ("Coronavirus"), follow CDC recommendations:  1. Wash your hand frequently 2. Avoid touching your face 3. Stay away from people who are sick 4. If you have symptoms such as fever, cough, shortness of breath then call your healthcare provider for further guidance 5. If you are sick, STAY AT HOME unless otherwise directed by your healthcare provider. 6. Follow directions from state and national officials regarding staying safe   At [facility] we value your feedback. You may receive a survey about your visit today. Please share your experience as we strive to create trusting relationships with our patients to provide genuine, compassionate, quality care.  We appreciate your understanding and patience as we review any laboratory studies, imaging, and other diagnostic tests that are ordered as we care for you. Our office policy is 5 business days for review of these results, and any emergent or urgent results are addressed in a timely manner for your best interest. If you do not hear from our office in 1 week, please contact us.   We also encourage the use of MyChart, which contains your medical information for your review as well. If you are not enrolled in this feature, an access code is on this after visit summary for your convenience. Thank you for allowing [place] to be involved in your  care.  It was great to see you today!  I hope you have a great day!!

## 2018-08-01 NOTE — Assessment & Plan Note (Signed)
Constipation well managed on Amitiza 24 mcg.  No complaints.  Call us for any worsening symptoms, otherwise follow-up in 1 year.

## 2018-08-01 NOTE — Progress Notes (Signed)
Referring Provider: [facility]* Primary Care Physician:  [facility]:  Dr. Gala Romney  NOTE: Service was provided via telemedicine and was requested by the patient due to COVID-19 pandemic.  Method of visit: Telephone (failed Zoom)  Patient Location: Home  Provider Location: Office  Reason for Phone Visit: Follow-up  The patient was consented to phone follow-up via telephone encounter including billing of the encounter (yes/no): Yes  Persons present on the phone encounter, with roles: Kids  Total time (minutes) spent on medical discussion: 15 minutes  Chief Complaint  Patient presents with  . Follow-up    6 mo f/u dysphagia,no problems anymore    HPI:   Destiny Manning is a 48 y.o. female who presents for virtual visit regarding: 2-month follow-up on dysphasia.  The patient was last seen in our office 08/23/2017 for constipation, tubular adenoma of colon, dysphagia.  She is on Amitiza 24 mcg chronically for constipation.  5-year surveillance colonoscopy due to adenoma (2024).  Empiric dilation of esophagus at time of EGD.  Back on Dexilant.  At last visit was complaining with intermittent solid food dysphagia, does not want to pursue additional testing but will notify us for any worsening.  Recommended continue Dexilant, continue Amitiza, Zantac as needed, notify us for any worsening, follow-up in 6 to 8 months.  Today she states she's doing ok. She's not having any further dysphagia. GERD well-controlled on Dexilant. Constipation still doing well on Amitiza 24 mcg. Denies abdominal pain, N/V, fever, chills, hematochezia, melena. Denies URI and flu-like symptoms. Denies chest pain, dyspnea, dizziness, lightheadedness, syncope, near syncope. Denies any other upper or lower GI symptoms.  Past Medical History:  Diagnosis Date  . Asthma dx 06/04/07   ventolin, singulair  . Blood transfusion without reported diagnosis   . CHF (congestive  heart failure) ([place]) 03/19/08   EF 35-40%  . DCM (dilated cardiomyopathy) ([place]) dx 06/06/07   EF 10-15%  . Degeneration of spine   . DJD (degenerative joint disease), lumbar 11/16/2016  . GERD (gastroesophageal reflux disease)    takes Nexium Daily  . Hypertension   . Seasonal allergies    takes zyrtec    Past Surgical History:  Procedure Laterality Date  . ABDOMINAL HYSTERECTOMY     fibroids  . CERVICAL SPINE SURGERY  2002   [facility], ruptured disc  . CHOLECYSTECTOMY  2001   laparoscopic, [facility]  . COLONOSCOPY WITH PROPOFOL N/A 07/12/2017   tubular adenoma (10 mm polyp in cecum), segmental biopsies negative. 5 year surveillance  . ESOPHAGOGASTRODUODENOSCOPY (EGD) WITH ESOPHAGEAL DILATION N/A 09/05/2012   EHU:DJSHF hiatal hernia s/p esophageal dilation with 75 F Maloney.   . ESOPHAGOGASTRODUODENOSCOPY (EGD) WITH PROPOFOL N/A 07/12/2017   normal esophagus s/p dilation, normal duodenal bulb and second portion of duodenum  . MALONEY DILATION N/A 07/12/2017   Procedure: [name] DILATION;  Surgeon: [name], MD;  [facility];  Service: Endoscopy;  Laterality: N/A;  . POLYPECTOMY  07/12/2017   Procedure: POLYPECTOMY;  Surgeon: [name], MD;  [facility]: [facility];  Service: Endoscopy;;  Cecal polyp (HS)  . SPINE SURGERY    . TUBAL LIGATION    . VAGINAL HYSTERECTOMY  10/19/2010   Procedure: HYSTERECTOMY VAGINAL;  Surgeon: [name], MD;  Location: [facility] ORS;  Service: Gynecology;  Laterality: N/A;    Current Outpatient Medications  Medication Sig Dispense Refill  . baclofen (LIORESAL) 10 MG tablet Take 1 tablet (10  mg total) by mouth 3 (three) times daily. (Patient taking differently: Take 10 mg by mouth 3 (three) times daily as needed for muscle spasms. ) 90 each 1  . cetirizine (ZYRTEC) 10 MG tablet Take 1 tablet (10 mg total) by mouth daily. 30 tablet 1  . DEXILANT 60 MG capsule TAKE (1) CAPSULE BY MOUTH ONCE DAILY. 30 capsule 11  . digoxin (LANOXIN) 0.125 MG tablet  Take 1 tablet (125 mcg total) by mouth daily. 30 tablet 1  . fluticasone (FLONASE) 50 MCG/ACT nasal spray Place 1 spray daily into both nostrils. (Patient taking differently: Place 1 spray into both nostrils 2 (two) times daily. ) 16 g 3  . Fluticasone-Salmeterol (ADVAIR) 500-50 MCG/DOSE AEPB Inhale 1 puff into the lungs 2 (two) times daily.    Marland Kitchen gabapentin (NEURONTIN) 300 MG capsule Take 1 capsule (300 mg total) 3 (three) times daily by mouth. (Patient taking differently: Take 300 mg by mouth 3 (three) times daily as needed (for pain.). ) 90 capsule 3  . losartan (COZAAR) 50 MG tablet Take 1 tablet (50 mg total) by mouth daily. 30 tablet 1  . lubiprostone (AMITIZA) 24 MCG capsule TAKE 1 CAPSULE TWICE DAILY WITH MEALS. 180 capsule 3  . montelukast (SINGULAIR) 10 MG tablet Take 10 mg by mouth daily.     . promethazine (PHENERGAN) 25 MG tablet Take 0.5 tablets (12.5 mg total) by mouth every 6 (six) hours as needed for nausea or vomiting. To 1 tablet as needed 30 tablet 0  . furosemide (LASIX) 20 MG tablet Take 10 mg by mouth daily.     . metroNIDAZOLE (METROGEL VAGINAL) 0.75 % vaginal gel Place 1 Applicatorful vaginally at bedtime. X 5 nights, no alcohol or sex while using (Patient not taking: Reported on 08/01/2018) 70 g 0   No current facility-administered medications for this visit.     Allergies as of 08/01/2018  . (No Known Allergies)    Family History  Problem Relation Age of Onset  . Diabetes Mother   . Hypertension Mother   . Congestive Heart Failure Father 8  . Arthritis Father   . Dementia Maternal Grandmother   . Congestive Heart Failure Paternal Grandmother   . Congestive Heart Failure Paternal Grandfather   . Cancer Maternal Uncle   . Anesthesia problems Neg Hx   . Hypotension Neg Hx   . Pseudochol deficiency Neg Hx   . Malignant hyperthermia Neg Hx   . Colon cancer Neg Hx   . Colon polyps Neg Hx     Social History   Socioeconomic History  . Marital status: Single     Spouse name: Not on file  . Number of children: 2  . Years of education: 14  . Highest education level: Not on file  Occupational History  . Occupation: disabled  Social Needs  . Financial resource strain: Not on file  . Food insecurity:    Worry: Not on file    Inability: Not on file  . Transportation needs:    Medical: Not on file    Non-medical: Not on file  Tobacco Use  . Smoking status: Former Smoker    Packs/day: 0.25    Years: 4.00    Pack years: 1.00    Types: Cigarettes    Last attempt to quit: 03/14/2009    Years since quitting: 9.3  . Smokeless tobacco: Never Used  Substance and Sexual Activity  . Alcohol use: No  . Drug use: No  . Sexual activity: Not  Currently    Birth control/protection: Surgical    Comment: hyst  Lifestyle  . Physical activity:    Days per week: Not on file    Minutes per session: Not on file  . Stress: Not on file  Relationships  . Social connections:    Talks on phone: Not on file    Gets together: Not on file    Attends religious service: Not on file    Active member of club or organization: Not on file    Attends meetings of clubs or organizations: Not on file    Relationship status: Not on file  Other Topics Concern  . Not on file  Social History Narrative   College degree data entry   Currently denied disability, patient feels unable to work   Lives with 2 children    Review of Systems: General: Negative for anorexia, weight loss, fever, chills, fatigue, weakness. Eyes: Negative for vision changes.  ENT: Negative for hoarseness, difficulty swallowing. CV: Negative for chest pain, angina, palpitations, peripheral edema.  Respiratory: Negative for dyspnea at rest, cough, sputum, wheezing.  GI: See history of present illness. GU:  Negative for dysuria, hematuria, urinary incontinence, urinary frequency, nocturnal urination.  MS: Negative for joint pain, low back pain.  Derm: Negative for rash or itching.  Neuro: Negative  for weakness, abnormal sensation, seizure, frequent headaches, memory loss, confusion.  Psych: Negative for anxiety, depression, suicidal ideation, hallucinations.  Endo: Negative for unusual weight change.  Heme: Negative for bruising or bleeding. Allergy: Negative for rash or hives.  Physical Exam: Note: limited exam due to virtual visit General:   Alert and oriented. Pleasant and cooperative. Well-nourished and well-developed.  Head:  Normocephalic and atraumatic. Eyes:  Without icterus, sclera clear and conjunctiva pink.  Ears:  Normal auditory acuity. Skin:  Intact without facial significant lesions or rashes. Neurologic:  Alert and oriented x4;  grossly normal neurologically. Psych:  Alert and cooperative. Normal mood and affect. Heme/Lymph/Immune: No excessive bruising noted.

## 2018-08-01 NOTE — Assessment & Plan Note (Signed)
The patient previously was having solid food dysphagia.  Underwent EGD with empiric dilation.  Some minimal residual symptoms at last visit.  The patient declined further EGD at this time.  Recommended follow-up.  Today she states she has not had any persistent solid food dysphagia.  Doing well overall.  Recommend she call us if she has any recurrent symptoms, otherwise follow-up in 1 year.

## 2018-08-02 ENCOUNTER — Other Ambulatory Visit: Payer: Self-pay

## 2018-08-02 ENCOUNTER — Ambulatory Visit (HOSPITAL_COMMUNITY)
Admission: RE | Admit: 2018-08-02 | Discharge: 2018-08-02 | Disposition: A | Discharge status: Home / Self Care | Payer: Medicaid Other | Source: Ambulatory Visit | Attending: Internal Medicine | Admitting: Internal Medicine

## 2018-08-02 DIAGNOSIS — R519 Headache, unspecified: Secondary | ICD-10-CM

## 2018-08-02 DIAGNOSIS — R51 Headache: Secondary | ICD-10-CM | POA: Insufficient documentation

## 2018-08-02 NOTE — Progress Notes (Signed)
CC'ED TO PCP 

## 2018-08-12 DIAGNOSIS — G43909 Migraine, unspecified, not intractable, without status migrainosus: Secondary | ICD-10-CM | POA: Diagnosis not present

## 2018-08-12 DIAGNOSIS — R6889 Other general symptoms and signs: Secondary | ICD-10-CM | POA: Diagnosis not present

## 2018-08-12 DIAGNOSIS — B351 Tinea unguium: Secondary | ICD-10-CM | POA: Diagnosis not present

## 2018-08-13 DIAGNOSIS — J45909 Unspecified asthma, uncomplicated: Secondary | ICD-10-CM | POA: Diagnosis not present

## 2018-09-30 ENCOUNTER — Other Ambulatory Visit: Payer: Self-pay | Admitting: Gastroenterology

## 2018-10-02 ENCOUNTER — Telehealth: Payer: Self-pay

## 2018-10-02 NOTE — Telephone Encounter (Signed)
Refill request received from [place] for Amitiza 24 mcg #60, take 1 capsule po bid with food.

## 2018-10-04 NOTE — Telephone Encounter (Signed)
Rx sent per refill box

## 2018-10-10 ENCOUNTER — Telehealth: Payer: Self-pay | Admitting: Internal Medicine

## 2018-10-10 NOTE — Telephone Encounter (Signed)
Patient called and said [name] sent a refill request for her Destiny Manning  And they have not heard from the office.  Can you please resend

## 2018-10-10 NOTE — Telephone Encounter (Signed)
Pt is requesting a refill for Amitiza 24 mcg bid with food, routing to [facility] refill box

## 2018-10-14 MED ORDER — LUBIPROSTONE 24 MCG PO CAPS: ORAL_CAPSULE | ORAL | 3 refills | Status: DC

## 2018-10-14 NOTE — Telephone Encounter (Signed)
Completed.

## 2018-10-14 NOTE — Addendum Note (Signed)
Addended by: Annitta Needs on: 10/14/2018 09:13 AM   Modules accepted: Orders

## 2018-10-17 DIAGNOSIS — H5213 Myopia, bilateral: Secondary | ICD-10-CM | POA: Diagnosis not present

## 2018-10-18 DIAGNOSIS — H5213 Myopia, bilateral: Secondary | ICD-10-CM | POA: Diagnosis not present

## 2018-10-21 ENCOUNTER — Ambulatory Visit: Payer: Medicaid Other | Admitting: Nurse Practitioner

## 2018-10-28 ENCOUNTER — Encounter: Payer: Self-pay | Admitting: Women's Health

## 2018-10-28 ENCOUNTER — Ambulatory Visit (INDEPENDENT_AMBULATORY_CARE_PROVIDER_SITE_OTHER): Payer: Medicaid Other | Admitting: Women's Health

## 2018-10-28 ENCOUNTER — Other Ambulatory Visit (HOSPITAL_COMMUNITY)
Admission: RE | Admit: 2018-10-28 | Discharge: 2018-10-28 | Disposition: A | Discharge status: Home / Self Care | Payer: Medicaid Other | Source: Ambulatory Visit | Attending: Obstetrics & Gynecology | Admitting: Obstetrics & Gynecology

## 2018-10-28 ENCOUNTER — Other Ambulatory Visit: Payer: Self-pay

## 2018-10-28 VITALS — BP 162/92 | HR 83 | Ht 63.0 in | Wt 143.4 lb

## 2018-10-28 DIAGNOSIS — R102 Pelvic and perineal pain: Secondary | ICD-10-CM | POA: Diagnosis not present

## 2018-10-28 DIAGNOSIS — N949 Unspecified condition associated with female genital organs and menstrual cycle: Secondary | ICD-10-CM

## 2018-10-28 MED ORDER — NAPROXEN 500 MG PO TABS: 500.0000 mg | ORAL_TABLET | Freq: Two times a day (BID) | ORAL | 0 refills | Status: DC | PRN

## 2018-10-28 NOTE — Progress Notes (Signed)
   GYN VISIT Patient name: Destiny Manning MRN [id number]  Date of birth: 1970-12-31 Chief Complaint:   sharpe pain (ovaries)  History of Present Illness:   Destiny Manning is a 48 y.o. [id number] African American female being seen today for report of bilateral ovary pain x 2-3wks. Constant sharp pain, worse at times, makes her double over. Ibuprofen doesn't help. Nothing makes it better or worse. Denies abnormal discharge, itching/odor/irritation.  No fever/chills/n/v/d. Has been constipated since having tooth removed, but that's better now. No urinary frequency/dysuria, etc. Hasn't had sex in >37yr. S/P hysterectomy for heavy periods/fibroids.      Patient's last menstrual period was 07/04/2010. The current method of family planning is tubal ligation. Last pap s/p hysterectomy for fibroids.  Review of Systems:   Pertinent items are noted in HPI Denies fever/chills, dizziness, headaches, visual disturbances, fatigue, shortness of breath, chest pain, abdominal pain, vomiting, abnormal vaginal discharge/itching/odor/irritation, problems with periods, bowel movements, urination, or intercourse unless otherwise stated above.  Pertinent History Reviewed:  Reviewed past medical,surgical, social, obstetrical and family history.  Reviewed problem list, medications and allergies. Physical Assessment:   Vitals:   10/28/18 1428  BP: (!) 162/92  Pulse: 83  Weight: 143 lb 6.4 oz (65 kg)  Height: 5\' 3"  (1.6 m)  Body mass index is 25.4 kg/m.       Physical Examination:   General appearance: alert, well appearing, and in no distress  Mental status: alert, oriented to person, place, and time  Skin: warm & dry   Cardiovascular: normal heart rate noted  Respiratory: normal respiratory effort, no distress  Abdomen: soft, non-tender   Pelvic: VULVA: normal appearing vulva with no masses, tenderness or lesions, VAGINA: ~1cm dark Manna circular discoloration near vaginal cuff- tender, co-exam w/ JAG, obtained  vaginal pap, ADNEXA: slightly tender bilaterally, Lt >Rt. Uterus & cervix surgically absent  Extremities: no edema   No results found for this or any previous visit (from the past 24 hour(s)).  Assessment & Plan:  1) Pelvic & vaginal pain> w/ 1cm vaginal wall tender discoloration, obtained vaginal pap, nuswab, urine cx, ordered pelvic u/s and f/u w/ LHE. Rx naproxen prn   Meds:  Meds ordered this encounter  Medications  . naproxen (NAPROSYN) 500 MG tablet    Sig: Take 1 tablet (500 mg total) by mouth every 12 (twelve) hours as needed for moderate pain.    Dispense:  30 tablet    Refill:  0    Order Specific Question:   Supervising Provider    Answer:   Tania Ade H [[id number]]    Orders Placed This Encounter  Procedures  . Urine Culture  . US PELVIS (TRANSABDOMINAL ONLY)  . US PELVIS TRANSVAGINAL NON-OB (TV ONLY)  . NuSwab Vaginitis Plus (VG+)    Return for 1st available pelvic u/s and f/u w/ LHE after.  [facility] 10/28/2018 3:58 PM

## 2018-10-29 ENCOUNTER — Other Ambulatory Visit: Payer: Self-pay | Admitting: Adult Health

## 2018-10-29 DIAGNOSIS — I10 Essential (primary) hypertension: Secondary | ICD-10-CM | POA: Diagnosis not present

## 2018-10-29 DIAGNOSIS — I509 Heart failure, unspecified: Secondary | ICD-10-CM | POA: Diagnosis not present

## 2018-10-29 DIAGNOSIS — R0600 Dyspnea, unspecified: Secondary | ICD-10-CM | POA: Diagnosis not present

## 2018-10-29 DIAGNOSIS — E785 Hyperlipidemia, unspecified: Secondary | ICD-10-CM | POA: Diagnosis not present

## 2018-10-29 DIAGNOSIS — R0602 Shortness of breath: Secondary | ICD-10-CM | POA: Diagnosis not present

## 2018-10-29 DIAGNOSIS — R062 Wheezing: Secondary | ICD-10-CM | POA: Diagnosis not present

## 2018-10-30 LAB — URINE CULTURE

## 2018-10-31 ENCOUNTER — Other Ambulatory Visit: Payer: Self-pay | Admitting: Obstetrics & Gynecology

## 2018-10-31 DIAGNOSIS — R0600 Dyspnea, unspecified: Secondary | ICD-10-CM | POA: Diagnosis not present

## 2018-10-31 DIAGNOSIS — I509 Heart failure, unspecified: Secondary | ICD-10-CM | POA: Diagnosis not present

## 2018-10-31 DIAGNOSIS — J302 Other seasonal allergic rhinitis: Secondary | ICD-10-CM | POA: Diagnosis not present

## 2018-10-31 LAB — CYTOLOGY - PAP
Diagnosis: NEGATIVE
HPV: NOT DETECTED

## 2018-11-05 LAB — NUSWAB VAGINITIS PLUS (VG+)
Atopobium vaginae: HIGH Score — AB
Candida albicans, NAA: NEGATIVE
Candida glabrata, NAA: NEGATIVE
Chlamydia trachomatis, NAA: NEGATIVE
Neisseria gonorrhoeae, NAA: NEGATIVE
Trich vag by NAA: NEGATIVE

## 2018-11-06 DIAGNOSIS — H524 Presbyopia: Secondary | ICD-10-CM | POA: Diagnosis not present

## 2018-11-06 DIAGNOSIS — M5030 Other cervical disc degeneration, unspecified cervical region: Secondary | ICD-10-CM | POA: Diagnosis not present

## 2018-11-06 DIAGNOSIS — H52223 Regular astigmatism, bilateral: Secondary | ICD-10-CM | POA: Diagnosis not present

## 2018-11-06 DIAGNOSIS — I10 Essential (primary) hypertension: Secondary | ICD-10-CM | POA: Diagnosis not present

## 2018-11-06 DIAGNOSIS — B351 Tinea unguium: Secondary | ICD-10-CM | POA: Diagnosis not present

## 2018-11-15 ENCOUNTER — Ambulatory Visit (INDEPENDENT_AMBULATORY_CARE_PROVIDER_SITE_OTHER): Payer: Medicaid Other

## 2018-11-15 ENCOUNTER — Other Ambulatory Visit: Payer: Self-pay

## 2018-11-15 ENCOUNTER — Ambulatory Visit (INDEPENDENT_AMBULATORY_CARE_PROVIDER_SITE_OTHER): Payer: Medicaid Other | Admitting: Obstetrics & Gynecology

## 2018-11-15 ENCOUNTER — Encounter: Payer: Self-pay | Admitting: Obstetrics & Gynecology

## 2018-11-15 VITALS — BP 154/82 | HR 76 | Ht 63.0 in | Wt 146.0 lb

## 2018-11-15 DIAGNOSIS — R102 Pelvic and perineal pain: Secondary | ICD-10-CM | POA: Diagnosis not present

## 2018-11-15 DIAGNOSIS — N949 Unspecified condition associated with female genital organs and menstrual cycle: Secondary | ICD-10-CM | POA: Diagnosis not present

## 2018-11-15 MED ORDER — PIROXICAM 20 MG PO CAPS: 20.0000 mg | ORAL_CAPSULE | Freq: Every day | ORAL | 6 refills | Status: DC

## 2018-11-15 NOTE — Progress Notes (Addendum)
PELVIC US TA/TV; normal vaginal cuff,normal right ovary,left simple dominate follicle 2 x 1.9 x 1.7 cm,bilat adnexal pain during ultrasound,difficult to slide ovaries because of pain,but appear to be mobile,no free fluid   CHAPERONE: Tammy

## 2018-11-15 NOTE — Progress Notes (Signed)
Follow up appointment for results  Chief Complaint  Patient presents with  . Results    [place] today    Blood pressure (!) 154/82, pulse 76, height 5\' 3"  (1.6 m), weight 146 lb (66.2 kg), last menstrual period 07/04/2010.  US Pelvis Transvaginal Non-ob (tv Only)  Result Date: 11/15/2018 GYNECOLOGIC SONOGRAM Destiny Manning is a 48 y.o. [phone] s/p hysterectomy,she is here for a pelvic sonogram for pelvic pain. Uterus                     Surgically removed,normal vaginal cuff Endometrium          n/a Right ovary             3 x 2 x 2.5 cm, wnl Left ovary                2.9 x 2.4 x 3.1 cm, wnl No free fluid Technician Comments: PELVIC US TA/TV; normal vaginal cuff,normal right ovary,left simple dominate follicle 2 x 1.9 x 1.7 cm,bilat adnexal pain during ultrasound,difficult to slide ovaries because of pain,but appear to be mobile,no free fluid [employer] 11/15/2018 11:12 AM Clinical Impression and recommendations: I have reviewed the sonogram results above, combined with the patient's current clinical course, below are my impressions and any appropriate recommendations for management based on the sonographic findings. Uterus is absent Both ovaries normal, probable some adhesions present causing diminshed movement [name] 11/15/2018 11:14 AM  US Pelvis (transabdominal Only)  Result Date: 11/15/2018 GYNECOLOGIC SONOGRAM Destiny Manning is a 48 y.o. [id number] s/p hysterectomy,she is here for a pelvic sonogram for pelvic pain. Uterus                     Surgically removed,normal vaginal cuff Endometrium          n/a Right ovary             3 x 2 x 2.5 cm, wnl Left ovary                2.9 x 2.4 x 3.1 cm, wnl No free fluid Technician Comments: PELVIC US TA/TV; normal vaginal cuff,normal right ovary,left simple dominate follicle 2 x 1.9 x 1.7 cm,bilat adnexal pain during ultrasound,difficult to slide ovaries because of pain,but appear to be mobile,no free fluid [employer] 11/15/2018 11:12 AM Clinical Impression  and recommendations: I have reviewed the sonogram results above, combined with the patient's current clinical course, below are my impressions and any appropriate recommendations for management based on the sonographic findings. Uterus is absent Both ovaries normal, probable some adhesions present causing diminshed movement [name] 11/15/2018 11:14 AM     MEDS ordered this encounter: Meds ordered this encounter  Medications  . piroxicam (FELDENE) 20 MG capsule    Sig: Take 1 capsule (20 mg total) by mouth daily.    Dispense:  30 capsule    Refill:  6    Orders for this encounter: No orders of the defined types were placed in this encounter.   Impression:   ICD-10-CM   1. Pelvic pain in female  R10.2      Plan: Probable adhesion issues with ovaries, responds to NSAIDs, sonogram normal  If stops responding or pain worsens, consider laparoscopic BSO  Pt in agreement with treatment plan and options  Follow Up: Return if symptoms worsen or fail to improve.       Face to face time:  10 minutes  Greater than 50% of the visit time was spent in counseling and coordination of care with the patient.  The summary and outline of the counseling and care coordination is summarized in the note above.   All questions were answered.  Past Medical History:  Diagnosis Date  . Asthma dx 06/04/07   ventolin, singulair  . Blood transfusion without reported diagnosis   . CHF (congestive heart failure) ([place]) 03/19/08   EF 35-40%  . DCM (dilated cardiomyopathy) ([place]) dx 06/06/07   EF 10-15%  . Degeneration of spine   . DJD (degenerative joint disease), lumbar 11/16/2016  . GERD (gastroesophageal reflux disease)    takes Nexium Daily  . Hypertension   . Seasonal allergies    takes zyrtec    Past Surgical History:  Procedure Laterality Date  . ABDOMINAL HYSTERECTOMY     fibroids  . CERVICAL SPINE SURGERY  2002   [facility], ruptured disc  . CHOLECYSTECTOMY  2001   laparoscopic,  [facility]  . COLONOSCOPY WITH PROPOFOL N/A 07/12/2017   tubular adenoma (10 mm polyp in cecum), segmental biopsies negative. 5 year surveillance  . ESOPHAGOGASTRODUODENOSCOPY (EGD) WITH ESOPHAGEAL DILATION N/A 09/05/2012   HBZ:JIRCV hiatal hernia s/p esophageal dilation with 35 F Maloney.   . ESOPHAGOGASTRODUODENOSCOPY (EGD) WITH PROPOFOL N/A 07/12/2017   normal esophagus s/p dilation, normal duodenal bulb and second portion of duodenum  . MALONEY DILATION N/A 07/12/2017   Procedure: [name] DILATION;  Surgeon: [name], MD;  [facility];  Service: Endoscopy;  Laterality: N/A;  . POLYPECTOMY  07/12/2017   Procedure: POLYPECTOMY;  Surgeon: [name], MD;  [facility];  Service: Endoscopy;;  Cecal polyp (HS)  . SPINE SURGERY    . TUBAL LIGATION    . VAGINAL HYSTERECTOMY  10/19/2010   Procedure: HYSTERECTOMY VAGINAL;  Surgeon: [name], MD;  Location: [facility];  Service: Gynecology;  Laterality: N/A;    OB History    Gravida  3   Para  2   Term  2   Preterm      AB  1   Living  2     SAB  1   TAB      Ectopic      Multiple      Live Births  2           No Known Allergies  Social History   Socioeconomic History  . Marital status: Single    Spouse name: Not on file  . Number of children: 2  . Years of education: 73  . Highest education level: Not on file  Occupational History  . Occupation: disabled  Social Needs  . Financial resource strain: Not on file  . Food insecurity    Worry: Not on file    Inability: Not on file  . Transportation needs    Medical: Not on file    Non-medical: Not on file  Tobacco Use  . Smoking status: Former Smoker    Packs/day: 0.25    Years: 4.00    Pack years: 1.00    Types: Cigarettes    Quit date: 03/14/2009    Years since quitting: 9.6  . Smokeless tobacco: Never Used  Substance and Sexual Activity  . Alcohol use: No  . Drug use: No  . Sexual activity: Not Currently    Birth  control/protection: Surgical    Comment: hyst  Lifestyle  . Physical activity    Days per week: Not on  file    Minutes per session: Not on file  . Stress: Not on file  Relationships  . Social Herbalist on phone: Not on file    Gets together: Not on file    Attends religious service: Not on file    Active member of club or organization: Not on file    Attends meetings of clubs or organizations: Not on file    Relationship status: Not on file  Other Topics Concern  . Not on file  Social History Narrative   College degree data entry   Currently denied disability, patient feels unable to work   Lives with 2 children    Family History  Problem Relation Age of Onset  . Diabetes Mother   . Hypertension Mother   . Congestive Heart Failure Father 69  . Arthritis Father   . Dementia Maternal Grandmother   . Congestive Heart Failure Paternal Grandmother   . Congestive Heart Failure Paternal Grandfather   . Cancer Maternal Uncle   . Anesthesia problems Neg Hx   . Hypotension Neg Hx   . Pseudochol deficiency Neg Hx   . Malignant hyperthermia Neg Hx   . Colon cancer Neg Hx   . Colon polyps Neg Hx

## 2019-01-20 DIAGNOSIS — M5030 Other cervical disc degeneration, unspecified cervical region: Secondary | ICD-10-CM | POA: Diagnosis not present

## 2019-02-19 ENCOUNTER — Other Ambulatory Visit: Payer: Self-pay | Admitting: Gastroenterology

## 2019-02-19 ENCOUNTER — Other Ambulatory Visit: Payer: Self-pay | Admitting: Women's Health

## 2019-03-11 ENCOUNTER — Other Ambulatory Visit (HOSPITAL_COMMUNITY): Payer: Self-pay | Admitting: Internal Medicine

## 2019-03-11 DIAGNOSIS — Z1231 Encounter for screening mammogram for malignant neoplasm of breast: Secondary | ICD-10-CM

## 2019-03-31 ENCOUNTER — Ambulatory Visit (HOSPITAL_COMMUNITY): Payer: Medicaid Other

## 2019-04-23 DIAGNOSIS — I509 Heart failure, unspecified: Secondary | ICD-10-CM | POA: Diagnosis not present

## 2019-04-23 DIAGNOSIS — O903 Peripartum cardiomyopathy: Secondary | ICD-10-CM | POA: Diagnosis not present

## 2019-04-23 DIAGNOSIS — R0602 Shortness of breath: Secondary | ICD-10-CM | POA: Diagnosis not present

## 2019-04-23 DIAGNOSIS — R0600 Dyspnea, unspecified: Secondary | ICD-10-CM | POA: Diagnosis not present

## 2019-04-28 ENCOUNTER — Other Ambulatory Visit: Payer: Self-pay | Admitting: Women's Health

## 2019-04-29 ENCOUNTER — Telehealth: Payer: Self-pay

## 2019-04-29 NOTE — Telephone Encounter (Signed)
Received a PA response from [email or web address]. called [place] at [phone] and PA was submitted and approved thorugh 04/23/2020.

## 2019-05-19 ENCOUNTER — Ambulatory Visit (HOSPITAL_COMMUNITY): Payer: Medicaid Other

## 2019-05-21 ENCOUNTER — Ambulatory Visit (HOSPITAL_COMMUNITY)
Admission: RE | Admit: 2019-05-21 | Discharge: 2019-05-21 | Disposition: A | Discharge status: Home / Self Care | Payer: Medicaid Other | Source: Ambulatory Visit | Attending: Internal Medicine | Admitting: Internal Medicine

## 2019-05-21 ENCOUNTER — Other Ambulatory Visit: Payer: Self-pay

## 2019-05-21 DIAGNOSIS — Z1231 Encounter for screening mammogram for malignant neoplasm of breast: Secondary | ICD-10-CM | POA: Insufficient documentation

## 2019-06-23 ENCOUNTER — Encounter: Payer: Self-pay | Admitting: Internal Medicine

## 2019-06-30 ENCOUNTER — Other Ambulatory Visit: Payer: Self-pay | Admitting: Gastroenterology

## 2019-07-03 DIAGNOSIS — I10 Essential (primary) hypertension: Secondary | ICD-10-CM | POA: Diagnosis not present

## 2019-07-03 DIAGNOSIS — I509 Heart failure, unspecified: Secondary | ICD-10-CM | POA: Diagnosis not present

## 2019-07-03 DIAGNOSIS — E785 Hyperlipidemia, unspecified: Secondary | ICD-10-CM | POA: Diagnosis not present

## 2019-07-03 DIAGNOSIS — R0600 Dyspnea, unspecified: Secondary | ICD-10-CM | POA: Diagnosis not present

## 2019-07-03 DIAGNOSIS — R0602 Shortness of breath: Secondary | ICD-10-CM | POA: Diagnosis not present

## 2019-07-09 DIAGNOSIS — E785 Hyperlipidemia, unspecified: Secondary | ICD-10-CM | POA: Diagnosis not present

## 2019-07-09 DIAGNOSIS — R062 Wheezing: Secondary | ICD-10-CM | POA: Diagnosis not present

## 2019-07-09 DIAGNOSIS — I509 Heart failure, unspecified: Secondary | ICD-10-CM | POA: Diagnosis not present

## 2019-07-09 DIAGNOSIS — I10 Essential (primary) hypertension: Secondary | ICD-10-CM | POA: Diagnosis not present

## 2019-07-17 ENCOUNTER — Encounter: Payer: Self-pay | Admitting: Nurse Practitioner

## 2019-07-17 ENCOUNTER — Ambulatory Visit: Payer: Medicaid Other | Admitting: Nurse Practitioner

## 2019-07-17 ENCOUNTER — Other Ambulatory Visit: Payer: Self-pay

## 2019-07-17 VITALS — BP 137/80 | HR 84 | Temp 96.9°F | Ht 63.0 in | Wt 141.6 lb

## 2019-07-17 DIAGNOSIS — K219 Gastro-esophageal reflux disease without esophagitis: Secondary | ICD-10-CM

## 2019-07-17 DIAGNOSIS — K5900 Constipation, unspecified: Secondary | ICD-10-CM | POA: Diagnosis not present

## 2019-07-17 NOTE — Progress Notes (Signed)
Referring Provider: [facility]* Primary Care Physician:  [facility]:  Dr. Gala Romney  Chief Complaint  Patient presents with  . Constipation    f/u, doing ok  . Gastroesophageal Reflux    doing ok    HPI:   Destiny Manning is a 49 y.o. female who presents for follow-up on constipation, GERD, dysphagia.  The patient was last seen in our office 08/01/2018 for the same.  Her last visit was a virtual office visit due to COVID-19/coronavirus pandemic.  History of tubular adenoma colon polyps.  Colonoscopy up-to-date next due in 2024.  Amitiza for chronic constipation.  EGD recently with empiric dilation and now back on Dexilant.    At her last visit dysphagia had resolved, GERD well controlled on Dexilant, constipation doing well on Amitiza 24 mcg twice daily.  No other overt GI complaints.  Recommended continue current medications and follow-up in 1 year.  Today she states she's doing well overall. GERD well managed on Dexilant, no breakthrough. Constipation doing well on Amitiza 24 mcg with regular, soft bowel movements daily. Denies abdominal pain, N/V, hematochezia, melena, fever, chills, unintentional weight loss. Denies URI or flu-like symptoms. Denies loss of sense of taste or smell. Has gotten first dose of COVID-19 vaccine and has appointment for 2nd dose next week. Denies chest pain, dyspnea, dizziness, lightheadedness, syncope, near syncope. Denies any other upper or lower GI symptoms.  Past Medical History:  Diagnosis Date  . Asthma dx 06/04/07   ventolin, singulair  . Blood transfusion without reported diagnosis   . CHF (congestive heart failure) ([place]) 03/19/08   EF 35-40%  . DCM (dilated cardiomyopathy) ([place]) dx 06/06/07   EF 10-15%  . Degeneration of spine   . DJD (degenerative joint disease), lumbar 11/16/2016  . GERD (gastroesophageal reflux disease)    takes Nexium Daily  . Hypertension   . Seasonal allergies    takes  zyrtec    Past Surgical History:  Procedure Laterality Date  . ABDOMINAL HYSTERECTOMY     fibroids  . CERVICAL SPINE SURGERY  2002   [facility], ruptured disc  . CHOLECYSTECTOMY  2001   laparoscopic, [facility]  . COLONOSCOPY WITH PROPOFOL N/A 07/12/2017   tubular adenoma (10 mm polyp in cecum), segmental biopsies negative. 5 year surveillance  . ESOPHAGOGASTRODUODENOSCOPY (EGD) WITH ESOPHAGEAL DILATION N/A 09/05/2012   [id number] hiatal hernia s/p esophageal dilation with 51 F Maloney.   . ESOPHAGOGASTRODUODENOSCOPY (EGD) WITH PROPOFOL N/A 07/12/2017   normal esophagus s/p dilation, normal duodenal bulb and second portion of duodenum  . MALONEY DILATION N/A 07/12/2017   Procedure: [name] DILATION;  Surgeon: [name], MD;  Location: [facility];  Service: Endoscopy;  Laterality: N/A;  . POLYPECTOMY  07/12/2017   Procedure: POLYPECTOMY;  Surgeon: [name], MD;  Location: [facility];  Service: Endoscopy;;  Cecal polyp (HS)  . SPINE SURGERY    . TUBAL LIGATION    . VAGINAL HYSTERECTOMY  10/19/2010   Procedure: HYSTERECTOMY VAGINAL;  Surgeon: [name], MD;  Location: [facility] ORS;  Service: Gynecology;  Laterality: N/A;    Current Outpatient Medications  Medication Sig Dispense Refill  . AMITIZA 8 MCG capsule TAKE (1) CAPSULE BY MOUTH TWICE DAILY WITH FOOD. 60 capsule 5  . cetirizine (ZYRTEC) 10 MG tablet Take 1 tablet (10 mg total) by mouth daily. 30 tablet 1  . DEXILANT 60 MG capsule TAKE ONE CAPSULE BY MOUTH ONCE DAILY. [place]  capsule 3  . digoxin (LANOXIN) 0.125 MG tablet Take 1 tablet (125 mcg total) by mouth daily. 30 tablet 1  . fluticasone (FLONASE) 50 MCG/ACT nasal spray Place 1 spray daily into both nostrils. (Patient taking differently: Place 1 spray into both nostrils 2 (two) times daily. ) 16 g 3  . Fluticasone-Salmeterol (ADVAIR) 500-50 MCG/DOSE AEPB Inhale 1 puff into the lungs 2 (two) times daily.    Marland Kitchen gabapentin (NEURONTIN) 300 MG capsule Take 1 capsule (300 mg total) 3  (three) times daily by mouth. (Patient taking differently: Take 300 mg by mouth 3 (three) times daily as needed (for pain.). ) 90 capsule 3  . losartan (COZAAR) 50 MG tablet Take 1 tablet (50 mg total) by mouth daily. 30 tablet 1  . montelukast (SINGULAIR) 10 MG tablet Take 10 mg by mouth daily.     . naproxen (NAPROSYN) 500 MG tablet TAKE 1 TABLET BY MOUTH EVERY 12 HOURS AS NEEDED FOR PAIN. 30 tablet 2  . piroxicam (FELDENE) 20 MG capsule Take 1 capsule (20 mg total) by mouth daily. 30 capsule 6   No current facility-administered medications for this visit.    Allergies as of 07/17/2019  . (No Known Allergies)    Family History  Problem Relation Age of Onset  . Diabetes Mother   . Hypertension Mother   . Congestive Heart Failure Father 69  . Arthritis Father   . Dementia Maternal Grandmother   . Congestive Heart Failure Paternal Grandmother   . Congestive Heart Failure Paternal Grandfather   . Cancer Maternal Uncle   . Anesthesia problems Neg Hx   . Hypotension Neg Hx   . Pseudochol deficiency Neg Hx   . Malignant hyperthermia Neg Hx   . Colon cancer Neg Hx   . Colon polyps Neg Hx     Social History   Socioeconomic History  . Marital status: Single    Spouse name: Not on file  . Number of children: 2  . Years of education: 70  . Highest education level: Not on file  Occupational History  . Occupation: disabled  Tobacco Use  . Smoking status: Former Smoker    Packs/day: 0.25    Years: 4.00    Pack years: 1.00    Types: Cigarettes    Quit date: 03/14/2009    Years since quitting: 10.3  . Smokeless tobacco: Never Used  Substance and Sexual Activity  . Alcohol use: No  . Drug use: No  . Sexual activity: Not Currently    Birth control/protection: Surgical    Comment: hyst  Other Topics Concern  . Not on file  Social History Narrative   College degree data entry   Currently denied disability, patient feels unable to work   Lives with 2 children   Social  Determinants of Health   Financial Resource Strain:   . Difficulty of Paying Living Expenses:   Food Insecurity:   . Worried About Charity fundraiser in the Last Year:   . Arboriculturist in the Last Year:   Transportation Needs:   . Film/video editor (Medical):   Marland Kitchen Lack of Transportation (Non-Medical):   Physical Activity:   . Days of Exercise per Week:   . Minutes of Exercise per Session:   Stress:   . Feeling of Stress :   Social Connections:   . Frequency of Communication with Friends and Family:   . Frequency of Social Gatherings with Friends and Family:   .  Attends Religious Services:   . Active Member of Clubs or Organizations:   . Attends Archivist Meetings:   Marland Kitchen Marital Status:     Subjective: Review of Systems  Constitutional: Negative for chills, fever, malaise/fatigue and weight loss.  HENT: Negative for congestion and sore throat.   Respiratory: Negative for cough and shortness of breath.   Cardiovascular: Negative for chest pain and palpitations.  Gastrointestinal: Negative for abdominal pain, blood in stool, constipation, diarrhea, heartburn, melena, nausea and vomiting.  Psychiatric/Behavioral: Negative for depression. The patient is not nervous/anxious.   All other systems reviewed and are negative.    Objective: BP 137/80   Pulse 84   Temp (!) 96.9 F (36.1 C) (Temporal)   Ht 5\' 3"  (1.6 m)   Wt 141 lb 9.6 oz (64.2 kg)   LMP 07/04/2010   BMI 25.08 kg/m  Physical Exam Vitals and nursing note reviewed.  Constitutional:      General: She is not in acute distress.    Appearance: Normal appearance. She is well-developed and normal weight. She is not ill-appearing, toxic-appearing or diaphoretic.  HENT:     Head: Normocephalic and atraumatic.     Nose: No congestion or rhinorrhea.  Eyes:     General: No scleral icterus. Cardiovascular:     Rate and Rhythm: Normal rate and regular rhythm.     Heart sounds: Normal heart sounds.    Pulmonary:     Effort: Pulmonary effort is normal. No respiratory distress.     Breath sounds: Normal breath sounds.  Abdominal:     General: Bowel sounds are normal.     Palpations: Abdomen is soft. There is no hepatomegaly, splenomegaly or mass.     Tenderness: There is no abdominal tenderness. There is no guarding or rebound.     Hernia: No hernia is present.  Skin:    General: Skin is warm and dry.     Coloration: Skin is not jaundiced.     Findings: No rash.  Neurological:     General: No focal deficit present.     Mental Status: She is alert and oriented to person, place, and time.  Psychiatric:        Attention and Perception: Attention normal.        Mood and Affect: Mood normal.        Speech: Speech normal.        Behavior: Behavior normal.        Thought Content: Thought content normal.        Cognition and Memory: Cognition and memory normal.       07/17/2019 10:04 AM   Disclaimer: This note was dictated with voice recognition software. Similar sounding words can inadvertently be transcribed and may not be corrected upon review.

## 2019-07-17 NOTE — Progress Notes (Signed)
CC'ED TO PCP 

## 2019-07-17 NOTE — Patient Instructions (Signed)
Your health issues we discussed today were:   GERD (reflux/heartburn): 1. I am glad you are doing well! 2. Continue taking your current medication (Dexilant daily) 3. Call us if you have any worsening or recurrent symptoms  Constipation: 1. I am glad you are doing well! 2. Continue taking Amitiza 24 mcg twice daily with food 3. Call us if you have any worsening or severe symptoms  Overall I recommend:  1. Continue your other current medications 2. Return for follow-up as needed (we may need to see you in 2 years for continued refills) 3. Call us if you have any questions or concerns   ---------------------------------------------------------------  I am glad you got your COVID-19 vaccines!  Even though you are fully vaccinated you should continue to wear a mask, socially distance from people you do not live with, and wash your hands frequently.  ---------------------------------------------------------------   At [facility] we value your feedback. You may receive a survey about your visit today. Please share your experience as we strive to create trusting relationships with our patients to provide genuine, compassionate, quality care.  We appreciate your understanding and patience as we review any laboratory studies, imaging, and other diagnostic tests that are ordered as we care for you. Our office policy is 5 business days for review of these results, and any emergent or urgent results are addressed in a timely manner for your best interest. If you do not hear from our office in 1 week, please contact us.   We also encourage the use of MyChart, which contains your medical information for your review as well. If you are not enrolled in this feature, an access code is on this after visit summary for your convenience. Thank you for allowing [place] to be involved in your care.  It was great to see you today!  I hope you have a great Summer!!

## 2019-07-17 NOTE — Assessment & Plan Note (Signed)
Doing well, no breakthrough.  Recommend she continue Dexilant and follow-up as needed.  Discussed that we may eventually need to see her in about 2 years to continue refills and she verbalized understanding.

## 2019-07-17 NOTE — Assessment & Plan Note (Signed)
Constipation doing well on Amitiza 24 mcg twice daily.  No breakthrough constipation, having daily bowel movements which are soft and pasty.  Recommend she continue Amitiza and follow-up as needed.  Discussed the need to follow-up in about 2 years for further refills which point we can make her an appointment.  She verbalized understanding.

## 2019-08-07 ENCOUNTER — Other Ambulatory Visit: Payer: Medicaid Other | Admitting: Women's Health

## 2019-08-28 DIAGNOSIS — Z021 Encounter for pre-employment examination: Secondary | ICD-10-CM | POA: Diagnosis not present

## 2019-10-09 ENCOUNTER — Encounter: Payer: Medicaid Other | Admitting: Adult Health

## 2019-10-14 NOTE — Progress Notes (Signed)
This encounter was created in error - please disregard.

## 2019-10-23 ENCOUNTER — Other Ambulatory Visit: Payer: Self-pay | Admitting: Nurse Practitioner

## 2019-10-30 ENCOUNTER — Other Ambulatory Visit: Payer: Medicaid Other | Admitting: Adult Health

## 2019-10-31 ENCOUNTER — Ambulatory Visit (INDEPENDENT_AMBULATORY_CARE_PROVIDER_SITE_OTHER): Payer: Medicaid Other | Admitting: Adult Health

## 2019-10-31 ENCOUNTER — Encounter: Payer: Self-pay | Admitting: Adult Health

## 2019-10-31 VITALS — BP 161/87 | HR 82 | Ht 63.0 in | Wt 135.0 lb

## 2019-10-31 DIAGNOSIS — Z7689 Persons encountering health services in other specified circumstances: Secondary | ICD-10-CM | POA: Insufficient documentation

## 2019-10-31 DIAGNOSIS — Z01419 Encounter for gynecological examination (general) (routine) without abnormal findings: Secondary | ICD-10-CM

## 2019-10-31 DIAGNOSIS — Z1211 Encounter for screening for malignant neoplasm of colon: Secondary | ICD-10-CM

## 2019-10-31 LAB — HEMOCCULT GUIAC POC 1CARD (OFFICE): Fecal Occult Blood, POC: NEGATIVE

## 2019-10-31 MED ORDER — NAPROXEN 500 MG PO TABS: 500.0000 mg | ORAL_TABLET | Freq: Two times a day (BID) | ORAL | 2 refills | Status: DC | PRN

## 2019-10-31 NOTE — Progress Notes (Signed)
Patient ID: Destiny Manning, female   DOB: 27-Feb-1971, 49 y.o.   MRN: [id number] History of Present Illness: Destiny Manning is a 49 year old black female,single, sp hysterectomy in for well woman gyn. She works at [employer]. PCP is [facility]   Current Medications, Allergies, Past Medical History, Past Surgical History, Family History and Social History were reviewed in [employer] record.     Review of Systems:  Patient denies any headaches, hearing loss, fatigue, blurred vision, shortness of breath, chest pain, problems with bowel movements, urination, or intercourse. No joint pain or mood swings. Has ovary pain at times  Physical Exam:BP (!) 161/87 (BP Location: Right Arm, Patient Position: Sitting, Cuff Size: Normal)   Pulse 82   Ht 5\' 3"  (1.6 m)   Wt 135 lb (61.2 kg)   LMP 07/04/2010   BMI 23.91 kg/m  General:  Well developed, well nourished, no acute distress Skin:  Warm and dry Neck:  Midline trachea, normal thyroid, good ROM, no lymphadenopathy Lungs; Clear to auscultation bilaterally Breast:  No dominant palpable mass, retraction, or nipple discharge Cardiovascular: Regular rate and rhythm Abdomen:  Soft, non tender, no hepatosplenomegaly Pelvic:  External genitalia is normal in appearance, no lesions.  The vagina is normal in appearance. Urethra has no lesions or masses. The cervix and uterus are absent.  No adnexal masses or tenderness noted.Bladder is non tender, no masses felt. Rectal: Good sphincter tone, no polyps, or hemorrhoids felt.  Hemoccult negative. Extremities/musculoskeletal:  No swelling or varicosities noted, no clubbing or cyanosis Psych:  No mood changes, alert and cooperative,seems happy AA is 0  -fall risk is low PHQ 9 score is 0 Examination chaperoned by Destiny Squibb LPN  Impression and Plan: 1. Encounter for well woman exam with routine gynecological exam Physical in 1 year Mammogram yearly Labs with cardiologist -colonoscopy per  GI Will refill naproxen, but do not take if has taken feldene Meds ordered this encounter  Medications  . naproxen (NAPROSYN) 500 MG tablet    Sig: Take 1 tablet (500 mg total) by mouth every 12 (twelve) hours as needed. for pain    Dispense:  60 tablet    Refill:  2    Order Specific Question:   Supervising Provider    Answer:   Elonda Husky, LUTHER H [[id number]]      2. Encounter for screening fecal occult blood testing

## 2019-11-24 ENCOUNTER — Other Ambulatory Visit: Payer: Self-pay | Admitting: Nurse Practitioner

## 2019-11-26 ENCOUNTER — Other Ambulatory Visit: Payer: Self-pay | Admitting: Internal Medicine

## 2020-05-11 ENCOUNTER — Other Ambulatory Visit (HOSPITAL_COMMUNITY): Payer: Self-pay | Admitting: Internal Medicine

## 2020-05-11 DIAGNOSIS — Z1231 Encounter for screening mammogram for malignant neoplasm of breast: Secondary | ICD-10-CM

## 2020-06-21 ENCOUNTER — Ambulatory Visit (INDEPENDENT_AMBULATORY_CARE_PROVIDER_SITE_OTHER): Payer: Medicaid Other | Admitting: Internal Medicine

## 2020-06-21 ENCOUNTER — Other Ambulatory Visit: Payer: Self-pay

## 2020-06-21 VITALS — BP 162/98 | HR 90 | Ht 63.0 in | Wt 141.0 lb

## 2020-06-21 DIAGNOSIS — I10 Essential (primary) hypertension: Secondary | ICD-10-CM | POA: Diagnosis not present

## 2020-06-21 DIAGNOSIS — I509 Heart failure, unspecified: Secondary | ICD-10-CM

## 2020-06-21 DIAGNOSIS — R0989 Other specified symptoms and signs involving the circulatory and respiratory systems: Secondary | ICD-10-CM | POA: Diagnosis not present

## 2020-06-21 MED ORDER — CHLORTHALIDONE 25 MG PO TABS: 12.5000 mg | ORAL_TABLET | Freq: Every day | ORAL | 3 refills | Status: DC

## 2020-06-21 NOTE — Patient Instructions (Addendum)
Medication Instructions:  Your physician has recommended you make the following change in your medication:   Stop Taking Digoxin  Start Taking Chlorthalidone 12.5 mg Daily   *If you need a refill on your cardiac medications before your next appointment, please call your pharmacy*   Lab Work: Your physician recommends that you return for lab work in: 10-14 Days 07/05/20  If you have labs (blood work) drawn today and your tests are completely normal, you will receive your results only by: Marland Kitchen MyChart Message (if you have MyChart) OR . A paper copy in the mail If you have any lab test that is abnormal or we need to change your treatment, we will call you to review the results.   Testing/Procedures: Your physician has requested that you have a carotid duplex. This test is an ultrasound of the carotid arteries in your neck. It looks at blood flow through these arteries that supply the brain with blood. Allow one hour for this exam. There are no restrictions or special instructions.  Your physician has requested that you have an echocardiogram. Echocardiography is a painless test that uses sound waves to create images of your heart. It provides your doctor with information about the size and shape of your heart and how well your heart's chambers and valves are working. This procedure takes approximately one hour. There are no restrictions for this procedure.   Follow-Up: At [facility], you and your health needs are our priority.  As part of our continuing mission to provide you with exceptional heart care, we have created designated Provider Care Teams.  These Care Teams include your primary Cardiologist (physician) and Advanced Practice Providers (APPs -  Physician Assistants and Nurse Practitioners) who all work together to provide you with the care you need, when you need it.  We recommend signing up for the patient portal called "MyChart".  Sign up information is provided on this After  Visit Summary.  MyChart is used to connect with patients for Virtual Visits (Telemedicine).  Patients are able to view lab/test results, encounter notes, upcoming appointments, etc.  Non-urgent messages can be sent to your provider as well.   To learn more about what you can do with MyChart, go to [email or web address].    Your next appointment:   6 week(s)  The format for your next appointment:   In Person  Provider:   Dorris Carnes, MD or Bernerd Pho, PA-C   Other Instructions Thank you for choosing [facility]!

## 2020-06-21 NOTE — Progress Notes (Signed)
Cardiology Office Note   Date:  06/21/2020   ID:  Destiny Manning, DOB Aug 12, 1970, MRN [id number]  PCP:  [facility]  Cardiologist:   Destiny Carnes, MD    Pt referred by Destiny Manning for continued care of CHF     History of Present Illness: Destiny Manning is a 50 y.o. female with a history of a cardiomyopathy  Per her report is was diagnosed in 2009 (? Peripartum (delivered last child in Nov 2008) or idopathic)  LVEF at time she says was 12%  She also has a hx of HTN and asthma   Followed byDr Destiny Manning, cardiologist in [place].   Never had a cath   Has had stress tests   Last  Echo in 2021 LVEF reported 55 to 01%; Gr I diastolic dysfunction.   Mild MS. Mid MR  Mod AI  The pt deneis CP    Breathing is OK unless having an asthma flare.   No dizziness  No palpitations   No LE edema Does have asthma       Current Meds  Medication Sig  . AMITIZA 8 MCG capsule TAKE (1) CAPSULE BY MOUTH TWICE DAILY WITH FOOD.  Marland Kitchen atorvastatin (LIPITOR) 10 MG tablet Take 10 mg by mouth daily.  . carvedilol (COREG) 25 MG tablet Take 25 mg by mouth 2 (two) times daily.  . cetirizine (ZYRTEC) 10 MG tablet Take 1 tablet (10 mg total) by mouth daily.  Marland Kitchen DEXILANT 60 MG capsule TAKE ONE CAPSULE BY MOUTH ONCE DAILY.  . digoxin (LANOXIN) 0.125 MG tablet Take 1 tablet (125 mcg total) by mouth daily.  . fluticasone (FLONASE) 50 MCG/ACT nasal spray Place 1 spray daily into both nostrils. (Patient taking differently: Place 1 spray into both nostrils 2 (two) times daily.)  . Fluticasone-Salmeterol (ADVAIR) 500-50 MCG/DOSE AEPB Inhale 1 puff into the lungs 2 (two) times daily.  Marland Kitchen gabapentin (NEURONTIN) 300 MG capsule Take 1 capsule (300 mg total) 3 (three) times daily by mouth. (Patient taking differently: Take 300 mg by mouth 3 (three) times daily as needed (for pain.).)  . losartan (COZAAR) 100 MG tablet Take 100 mg by mouth daily.  . montelukast (SINGULAIR) 10 MG tablet Take 10 mg by  mouth daily.   . naproxen (NAPROSYN) 500 MG tablet Take 1 tablet (500 mg total) by mouth every 12 (twelve) hours as needed. for pain  . [DISCONTINUED] losartan (COZAAR) 50 MG tablet Take 1 tablet (50 mg total) by mouth daily.     Allergies:   Patient has no known allergies.   Past Medical History:  Diagnosis Date  . Asthma dx 06/04/07   ventolin, singulair  . Blood transfusion without reported diagnosis   . CHF (congestive heart failure) ([place]) 03/19/08   EF 35-40%  . DCM (dilated cardiomyopathy) ([place]) dx 06/06/07   EF 10-15%  . Degeneration of spine   . DJD (degenerative joint disease), lumbar 11/16/2016  . GERD (gastroesophageal reflux disease)    takes Nexium Daily  . Hypertension   . Seasonal allergies    takes zyrtec    Past Surgical History:  Procedure Laterality Date  . ABDOMINAL HYSTERECTOMY     fibroids  . CERVICAL SPINE SURGERY  2002   [facility], ruptured disc  . CHOLECYSTECTOMY  2001   [facility]aroscopic, [facility]  . COLONOSCOPY WITH PROPOFOL N/A 07/12/2017   tubular adenoma (10 mm polyp in cecum), segmental biopsies negative. 5 year surveillance  . ESOPHAGOGASTRODUODENOSCOPY (EGD) WITH  ESOPHAGEAL DILATION N/A 09/05/2012   PYK:[id number] hiatal hernia s/p esophageal dilation with 54 F Maloney.   . ESOPHAGOGASTRODUODENOSCOPY (EGD) WITH PROPOFOL N/A 07/12/2017   normal esophagus s/p dilation, normal duodenal bulb and second portion of duodenum  . MALONEY DILATION N/A 07/12/2017   Procedure: Destiny Manning DILATION;  Surgeon: Destiny Dolin, MD;  [facility];  Service: Endoscopy;  Laterality: N/A;  . POLYPECTOMY  07/12/2017   Procedure: POLYPECTOMY;  Surgeon: Destiny Dolin, MD;  Location: [facility];  Service: Endoscopy;;  Cecal polyp (HS)  . SPINE SURGERY    . TUBAL LIGATION    . VAGINAL HYSTERECTOMY  10/19/2010   Procedure: HYSTERECTOMY VAGINAL;  Surgeon: Destiny Buff, MD;  Location: [facility] ORS;  Service: Gynecology;  Laterality: N/A;     Social History:  The patient  reports that  she quit smoking about 11 years ago. Her smoking use included cigarettes. She has a 1.00 pack-year smoking history. She has never used smokeless tobacco. She reports that she does not drink alcohol and does not use drugs.   Family History:  The patient's family history includes Arthritis in her father; Cancer in her maternal uncle; Congestive Heart Failure in her paternal grandfather and paternal grandmother; Congestive Heart Failure (age of onset: 52) in her father; Dementia in her maternal grandmother; Diabetes in her mother; Hypertension in her mother.    ROS:  Please see the history of present illness. All other systems are reviewed and  Negative to the above problem except as noted.    PHYSICAL EXAM: VS:  BP (!) 162/98   Pulse 90   Ht 5\' 3"  (1.6 m)   Wt 141 lb (64 kg)   LMP 07/04/2010   SpO2 97%   BMI 24.98 kg/m   GEN: Thin 50 yo  in no acute distress  HEENT: normal  Neck: no JVD  Bilateral bruits when tips head back   Cardiac: RRR; no murmurs  No LE  edema  Respiratory:  clear to auscultation bilaterally, normal work of breathing GI: soft, nontender, nondistended, + BS  No hepatomegaly  MS: no deformity Moving all extremities   Skin: warm and dry, no rash Neuro:  Strength and sensation are intact Psych: euthymic mood, full affect   EKG:  EKG is ordered today.  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    Need to get full set of cardiology records   Last echo in 2021 LVEF normalized .    On exam, volume status looks good  Recomm:   Get old records Keep on current regimen except stop Digoxiin Get echo to set as baseline now that she is  followed her     I also want to eval valves given reports of MR, AI, MS on previous echo  2  Hx HTN   BP is not optimal    Would start low dose chlorthalidone   Check BMET in 10 days Try to get cuff for her to use   3  Bruits   Only with  laying back   Check carotids to confirm  4  ? CAD   In note from primary cardiologist Destiny Manning   ? CAD  Review records    No Hx of angina  5  HL   ON atorvastatin     LIpids from Aug 2021 were very good   LDL 56; HDL 51  Continue statin  6  Hx asthma   Continue inhalers and allergy meds    F/U in 6 wks in clinic for eval of BP   Current medicines are reviewed at length with the patient today.  The patient does not have concerns regarding medicines.  Signed, Destiny Carnes, MD  06/21/2020 2:17 PM    [facility] [place], [place], [place]  [address] Phone: [phone]; Fax: [phone]

## 2020-06-28 ENCOUNTER — Other Ambulatory Visit: Payer: Self-pay

## 2020-06-28 ENCOUNTER — Ambulatory Visit (HOSPITAL_COMMUNITY)
Admission: RE | Admit: 2020-06-28 | Discharge: 2020-06-28 | Disposition: A | Discharge status: Home / Self Care | Payer: Medicaid Other | Source: Ambulatory Visit | Attending: Internal Medicine | Admitting: Internal Medicine

## 2020-06-28 DIAGNOSIS — R0989 Other specified symptoms and signs involving the circulatory and respiratory systems: Secondary | ICD-10-CM | POA: Insufficient documentation

## 2020-06-28 DIAGNOSIS — Z1231 Encounter for screening mammogram for malignant neoplasm of breast: Secondary | ICD-10-CM

## 2020-07-06 ENCOUNTER — Other Ambulatory Visit: Payer: Self-pay | Admitting: Gastroenterology

## 2020-07-06 ENCOUNTER — Other Ambulatory Visit: Payer: Self-pay | Admitting: Internal Medicine

## 2020-07-07 ENCOUNTER — Other Ambulatory Visit: Payer: Self-pay | Admitting: Internal Medicine

## 2020-07-07 NOTE — Telephone Encounter (Signed)
Requested Prescriptions  Pending Prescriptions Disp Refills . carvedilol (COREG) 25 MG tablet [Pharmacy Med Name: CARVEDILOL 25MG  TABLET] 60 tablet 0   Sig: TAKE 1 TABLET BY MOUTH 2 TIMES DAILY. . cetirizine (ZYRTEC) 10 MG tablet [Pharmacy Med Name: CETIRIZINE HCL 10MG  TABLET] 30 tablet 0   Sig: TAKE 1 TABLET BY MOUTH ONCE A DAY. Marland Kitchen losartan (COZAAR) 100 MG tablet [Pharmacy Med Name: LOSARTAN POTASSIUM 100 MG TAB] 30 tablet 0   Sig: TAKE 1 TABLET BY MOUTH ONCE DAILY.   Patient called back to check status of Rx for [name]. She said she should have 7-10 but I only see 4. atorvastatin (LIPITOR) 10 MG tablet carvedilol (COREG) 25 MG tablet chlorthalidone (HYGROTON) 25 MG tablet losartan (COZAAR) 100 MG tablet

## 2020-07-07 NOTE — Telephone Encounter (Signed)
Contacted patient and patient verbalized the understanding to reach out to her PCP for refills on non-cardiac medications.

## 2020-07-08 ENCOUNTER — Other Ambulatory Visit: Payer: Self-pay | Admitting: Internal Medicine

## 2020-07-08 ENCOUNTER — Other Ambulatory Visit: Payer: Self-pay

## 2020-07-08 ENCOUNTER — Ambulatory Visit (HOSPITAL_COMMUNITY)
Admission: RE | Admit: 2020-07-08 | Discharge: 2020-07-08 | Disposition: A | Discharge status: Home / Self Care | Payer: Medicaid Other | Source: Ambulatory Visit | Attending: Internal Medicine | Admitting: Internal Medicine

## 2020-07-08 DIAGNOSIS — I351 Nonrheumatic aortic (valve) insufficiency: Secondary | ICD-10-CM

## 2020-07-08 DIAGNOSIS — I509 Heart failure, unspecified: Secondary | ICD-10-CM | POA: Diagnosis not present

## 2020-07-08 LAB — ECHOCARDIOGRAM COMPLETE
AR max vel: 1.3 cm2
AV Area VTI: 1.37 cm2
AV Area mean vel: 1.46 cm2
AV Mean grad: 9 mmHg
AV Peak grad: 18.6 mmHg
Ao pk vel: 2.16 m/s
Area-P 1/2: 1.46 cm2
MV M vel: 5.16 m/s
MV Peak grad: 106.5 mmHg
P 1/2 time: 339 msec
Radius: 0.4 cm
S' Lateral: 2.7 cm

## 2020-07-08 NOTE — Progress Notes (Signed)
*  PRELIMINARY RESULTS* Echocardiogram 2D Echocardiogram has been performed.  Destiny Manning 07/08/2020, 2:13 PM

## 2020-08-03 ENCOUNTER — Encounter: Payer: Self-pay | Admitting: Student

## 2020-08-03 ENCOUNTER — Ambulatory Visit (INDEPENDENT_AMBULATORY_CARE_PROVIDER_SITE_OTHER): Payer: Medicaid Other | Admitting: Student

## 2020-08-03 ENCOUNTER — Other Ambulatory Visit: Payer: Self-pay

## 2020-08-03 VITALS — BP 116/64 | HR 89 | Ht 63.0 in | Wt 143.0 lb

## 2020-08-03 DIAGNOSIS — I340 Nonrheumatic mitral (valve) insufficiency: Secondary | ICD-10-CM | POA: Diagnosis not present

## 2020-08-03 DIAGNOSIS — Z8679 Personal history of other diseases of the circulatory system: Secondary | ICD-10-CM

## 2020-08-03 DIAGNOSIS — R9389 Abnormal findings on diagnostic imaging of other specified body structures: Secondary | ICD-10-CM | POA: Diagnosis not present

## 2020-08-03 DIAGNOSIS — I10 Essential (primary) hypertension: Secondary | ICD-10-CM | POA: Diagnosis not present

## 2020-08-03 DIAGNOSIS — E785 Hyperlipidemia, unspecified: Secondary | ICD-10-CM

## 2020-08-03 MED ORDER — CARVEDILOL 25 MG PO TABS: 25.0000 mg | ORAL_TABLET | Freq: Two times a day (BID) | ORAL | 3 refills | Status: DC

## 2020-08-03 MED ORDER — LOSARTAN POTASSIUM 100 MG PO TABS: 1.0000 | ORAL_TABLET | Freq: Every day | ORAL | 3 refills | Status: DC

## 2020-08-03 MED ORDER — CHLORTHALIDONE 25 MG PO TABS: 12.5000 mg | ORAL_TABLET | Freq: Every day | ORAL | 3 refills | Status: DC

## 2020-08-03 MED ORDER — ATORVASTATIN CALCIUM 10 MG PO TABS: 1.0000 | ORAL_TABLET | Freq: Every day | ORAL | 3 refills | Status: DC

## 2020-08-03 NOTE — Progress Notes (Signed)
Cardiology Office Note    Date:  08/03/2020   ID:  Destiny Manning, DOB 08/04/1970, MRN [id number]  PCP:  [facility]  Cardiologist: Dorris Carnes, MD    Chief Complaint  Patient presents with  . Follow-up    6 week visit    History of Present Illness:    Destiny Manning is a 50 y.o. 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   She was examined by [name] in 06/2020 as a new patient referral due to her history of cardiomyopathy and wanting to switch to a different Cardiologist. She denied any recent chest pain or palpitations and only reported dyspnea when having a flare up of her asthma. Digoxin was discontinued and she was started on Chlorthalidone 12.5mg  daily while being continued on Coreg and Losartan. A repeat echocardiogram along with carotid dopplers were recommended (given bruits on examination). Her carotid dopplers showed tortuosity along the distal internal carotid arteries bilaterally with associated elevated flow velocities and no sonographic evidence of significant flow limiting stenosis or atherosclerotic plaque formation but could be seen with fibromuscular dysplasia. CTA neck was recommended for further evaluation. Echocardiogram showed a preserved EF of 60-65% with no regional WMA. RV function was normal but she did have a trivial pericardial effusion, mild to moderate MR and mild to moderate AI with a repeat echocardiogram recommended in 1 year.   In talking with the patient today, she reports having intermittent dyspnea with exertion which has overall been stable in the setting of known asthma and symptoms typically improve with the use of her inhaler. She has baseline 2 pillow orthopnea but denies any PND or pitting  edema. No recent exertional chest pain or palpitations.  She does report increased urination since starting Chlorthalidone but has overall tolerated this well. BP has significantly improved and is at 116/64 during today's visit  Past Medical History:  Diagnosis Date  . Asthma dx 06/04/07   ventolin, singulair  . Blood transfusion without reported diagnosis   . CHF (congestive heart failure) ([place]) 03/19/2008   a. 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 normalized by repeat imaging and at 55-60% in 04/2019,  . DCM (dilated cardiomyopathy) ([place]) dx 06/06/07   EF 10-15%  . Degeneration of spine   . DJD (degenerative joint disease), lumbar 11/16/2016  . GERD (gastroesophageal reflux disease)    takes Nexium Daily  . Hypertension   . Seasonal allergies    takes zyrtec    Past Surgical History:  Procedure Laterality Date  . ABDOMINAL HYSTERECTOMY     fibroids  . CERVICAL SPINE SURGERY  2002   [facility], ruptured disc  . CHOLECYSTECTOMY  2001   laparoscopic, [facility]  . COLONOSCOPY WITH PROPOFOL N/A 07/12/2017   tubular adenoma (10 mm polyp in cecum), segmental biopsies negative. 5 year surveillance  . ESOPHAGOGASTRODUODENOSCOPY (EGD) WITH ESOPHAGEAL DILATION N/A 09/05/2012   ATF:TDDUK hiatal hernia s/p esophageal dilation with [address].   . ESOPHAGOGASTRODUODENOSCOPY (EGD) WITH PROPOFOL N/A 07/12/2017   normal esophagus s/p dilation, normal duodenal bulb and second portion of duodenum  . MALONEY DILATION N/A 07/12/2017   Procedure: [name] DILATION;  Surgeon: [name], MD;  Location: [facility];  Service: Endoscopy;  Laterality: N/A;  .  POLYPECTOMY  07/12/2017   Procedure: POLYPECTOMY;  Surgeon: [name], MD;  Location: [facility] END[facility] SU[facility];  Service: Endoscopy;;  Cecal polyp ([facility])  . SPINE SURGERY    . TUBAL LIGATION    . VAGINAL HYSTERECTOMY  10/19/2010   Procedure: HYSTERECTOMY VAGINAL;  Surgeon: [name], MD;  Location: [facility] ORS;  Service: Gynecology;   Laterality: N/A;    Current Medications: Outpatient Medications Prior to Visit  Medication Sig Dispense Refill  . AMITIZA 8 MCG capsule TAKE (1) CAPSULE BY MOUTH TWICE DAILY. 60 capsule 5  . cetirizine (ZYRTEC) 10 MG tablet Take 1 tablet (10 mg total) by mouth daily. 30 tablet 1  . DEXILANT 60 MG capsule TAKE ONE CAPSULE BY MOUTH ONCE DAILY. 30 capsule 5  . fluticasone (FLONASE) 50 MCG/ACT nasal spray Place 1 spray daily into both nostrils. (Patient taking differently: Place 1 spray into both nostrils 2 (two) times daily.) 16 g 3  . Fluticasone-Salmeterol (ADVAIR) 500-50 MCG/DOSE AEPB Inhale 1 puff into the lungs 2 (two) times daily.    Marland Kitchen gabapentin (NEURONTIN) 300 MG capsule Take 1 capsule (300 mg total) 3 (three) times daily by mouth. (Patient taking differently: Take 300 mg by mouth 3 (three) times daily as needed (for pain.).) 90 capsule 3  . montelukast (SINGULAIR) 10 MG tablet Take 10 mg by mouth daily.     . naproxen (NAPROSYN) 500 MG tablet Take 1 tablet (500 mg total) by mouth every 12 (twelve) hours as needed. for pain 60 tablet 2  . atorvastatin (LIPITOR) 10 MG tablet TAKE ONE TABLET BY MOUTH ONCE DAILY 30 tablet 11  . carvedilol (COREG) 25 MG tablet TAKE ONE TABLET BY MOUTH 2 TIMES A DAY 60 tablet 11  . chlorthalidone (HYGROTON) 25 MG tablet Take 0.5 tablets (12.5 mg total) by mouth daily. 45 tablet 3  . losartan (COZAAR) 100 MG tablet TAKE ONE TABLET BY MOUTH ONCE DAILY 30 tablet 11   No facility-administered medications prior to visit.     Allergies:   Patient has no known allergies.   Social History   Socioeconomic History  . Marital status: Single    Spouse name: Not on file  . Number of children: 2  . Years of education: 82  . Highest education level: Not on file  Occupational History  . Occupation: disabled  Tobacco Use  . Smoking status: Former Smoker    Packs/day: 0.25    Years: 4.00    Pack years: 1.00    Types: Cigarettes    Quit date: 03/14/2009    Years  since quitting: 11.3  . Smokeless tobacco: Never Used  Vaping Use  . Vaping Use: Never used  Substance and Sexual Activity  . Alcohol use: No  . Drug use: No  . Sexual activity: Not Currently    Birth control/protection: Surgical    Comment: hyst  Other Topics Concern  . Not on file  Social History Narrative   College degree data entry   Currently denied disability, patient feels unable to work   Lives with 2 children   Social Determinants of Health   Financial Resource Strain: Medium Risk  . Difficulty of Paying Living Expenses: Somewhat hard  Food Insecurity: No Food Insecurity  . Worried About Charity fundraiser in the Last Year: Never true  . Ran Out of Food in the Last Year: Never true  Transportation Needs: No Transportation Needs  . Lack of Transportation (Medical): No  . Lack of Transportation (Non-Medical):  No  Physical Activity: Inactive  . Days of Exercise per Week: 0 days  . Minutes of Exercise per Session: 0 min  Stress: No Stress Concern Present  . Feeling of Stress : Not at all  Social Connections: Moderately Isolated  . Frequency of Communication with Friends and Family: More than three times a week  . Frequency of Social Gatherings with Friends and Family: Never  . Attends Religious Services: More than 4 times per year  . Active Member of Clubs or Organizations: No  . Attends Archivist Meetings: Never  . Marital Status: Never married     Family History:  The patient's family history includes Arthritis in her father; Cancer in her maternal uncle; Congestive Heart Failure in her paternal grandfather and paternal grandmother; Congestive Heart Failure (age of onset: 26) in her father; Dementia in her maternal grandmother; Diabetes in her mother; Hypertension in her mother.   Review of Systems:    Please see the history of present illness.     All other systems reviewed and are otherwise negative except as noted above.   Physical Exam:     VS:  BP 116/64   Pulse 89   Ht 5\' 3"  (1.6 m)   Wt 143 lb (64.9 kg)   LMP 07/04/2010   SpO2 98%   BMI 25.33 kg/m    General: Well developed, well nourished,female appearing in no acute distress. Head: Normocephalic, atraumatic. Neck: JVD not elevated. Bilateral carotid bruits. Lungs: Respirations regular and unlabored, without wheezes or rales.  Heart: Regular rate and rhythm. No S3 or S4.  No murmur, no rubs, or gallops appreciated. Abdomen: Appears non-distended. No obvious abdominal masses. Msk:  Strength and tone appear normal for age. No obvious joint deformities or effusions. Extremities: No clubbing or cyanosis. No lower extremity edema.  Distal pedal pulses are 2+ bilaterally. Neuro: Alert and oriented X 3. Moves all extremities spontaneously. No focal deficits noted. Psych:  Responds to questions appropriately with a normal affect. Skin: No rashes or lesions noted  Wt Readings from Last 3 Encounters:  08/03/20 143 lb (64.9 kg)  06/21/20 141 lb (64 kg)  10/31/19 135 lb (61.2 kg)     Studies/Labs Reviewed:   EKG:  EKG is not ordered today.    Recent Labs: No results found for requested labs within last 8760 hours.   Lipid Panel No results found for: CHOL, TRIG, HDL, CHOLHDL, VLDL, LDLCALC, LDLDIRECT  Additional studies/ records that were reviewed today include:   NST: 10/2017   Carotid Dopplers: 06/2020 IMPRESSION: 1. Tortuosity is noted about the distal internal carotid arteries bilaterally with associated elevated flow velocities. No sonographic evidence of significant flow limiting stenosis or atherosclerotic plaque formation. These findings could be seen with fibromuscular dysplasia. 2. 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  Consider CTA neck for further morphologic characterization of the internal carotid arteries.  Echocardiogram: 06/2020 IMPRESSIONS    1. Left  ventricular ejection fraction, by estimation, is 60 to 65%. The  left ventricle has normal function. The left ventricle has no regional  wall motion abnormalities. There is moderate left ventricular hypertrophy.  Left ventricular diastolic  parameters are indeterminate.  2. Right ventricular systolic function is normal. The right ventricular  size is normal. Tricuspid regurgitation signal is inadequate for assessing  PA pressure.  3. Left atrial size was upper normal.  4. There is a trivial pericardial effusion posterior to the left  ventricle.  5. The mitral valve is abnormal, mildly thickened with somewhat  restricted motion. Mild to moderate mitral valve regurgitation.  6. The aortic valve is tricuspid. Aortic valve regurgitation is mild to  moderate. Aortic regurgitation PHT measures 339 msec. Aortic valve mean  gradient measures 9.0 mmHg.  7. The inferior vena cava is normal in size with greater than 50%  respiratory variability, suggesting right atrial pressure of 3 mmHg.   Assessment:    1. History of cardiomyopathy   2. Abnormal Doppler ultrasound of carotid artery   3. Mitral valve insufficiency, unspecified etiology   4. Essential hypertension   5. Hyperlipidemia LDL goal <70      Plan:   In order of problems listed above:  1. History of Peripartum Cardiomyopathy - Occurred in 2009 with EF at 12% per her report and EF has since normalized. She did have a low-risk stress test in 2019 and recent echocardiogram last month showed a preserved EF as outlined above.   - She appears euvolemic by examination today. Remains on Coreg 25 mg twice daily and Losartan 100 mg daily.   2. Abnormal Carotid Dopplers - Recent carotid dopplers showed tortuosity along the distal internal carotid arteries bilaterally with associated elevated flow velocities and no sonographic evidence of significant flow limiting stenosis or atherosclerotic plaque formation but could be seen with  fibromuscular dysplasia. CTA Neck was recommended for further evaluation.  - Reviewed with the patient today and will plan to order a CTA Neck for further evaluation as previously recommended.   3. Mitral Regurgitation/Aortic Regurgitation - Recent echocardiogram showed mild to moderate MR and mild to moderate AI. Reviewed results with the patient today. Would anticipate obtaining a repeat echocardiogram next year.   4. HTN - BP is well controlled at 116/64 during today's visit.  Continue current medication regimen with Coreg 25 mg twice daily, Chlorthalidone 12.5 mg daily and Losartan 100 mg daily. I did encourage her to have a repeat BMET given the recent addition of Chlorthalidone.   5. HLD - LDL was at 56 in 11/2019. She remains on Atorvastatin 10 mg daily.   She prefers to follow-up on an annual basis. Informed the patient this is fine and to call us for a sooner appointment if any acute issues arise in the interim.    Medication Adjustments/Labs and Tests Ordered: Current medicines are reviewed at length with the patient today.  Concerns regarding medicines are outlined above.  Medication changes, Labs and Tests ordered today are listed in the Patient Instructions below. Patient Instructions  Medication Instructions:  Your physician recommends that you continue on your current medications as directed. Please refer to the Current Medication list given to you today.  *If you need a refill on your cardiac medications before your next appointment, please call your pharmacy*   Lab Work: Your physician recommends that you return for lab work in: BMET with in the next week   If you have labs (blood work) drawn today and your tests are completely normal, you will receive your results only by: Marland Kitchen MyChart Message (if you have MyChart) OR . A paper copy in the mail If you have any lab test that is abnormal or we need to change your treatment, we will call you to review the  results.   Testing/Procedures: CT of the neck    Follow-Up: At [facility], you and your health needs are our priority.  As part of our continuing mission to provide you with exceptional  heart care, we have created designated Provider Care Teams.  These Care Teams include your primary Cardiologist (physician) and Advanced Practice Providers (APPs -  Physician Assistants and Nurse Practitioners) who all work together to provide you with the care you need, when you need it.  We recommend signing up for the patient portal called "MyChart".  Sign up information is provided on this After Visit Summary.  MyChart is used to connect with patients for Virtual Visits (Telemedicine).  Patients are able to view lab/test results, encounter notes, upcoming appointments, etc.  Non-urgent messages can be sent to your provider as well.   To learn more about what you can do with MyChart, go to [email or web address].    Your next appointment:   1 year(s)  The format for your next appointment:   In Person  Provider:   Dorris Carnes, MD   Other Instructions Thank you for choosing [facility]!       Signed, Erma Heritage, PA-C  08/03/2020 4:36 PM    [facility] [address] [place], [place] [address] Phone: [phone] Fax: [phone]

## 2020-08-03 NOTE — Patient Instructions (Signed)
Medication Instructions:  Your physician recommends that you continue on your current medications as directed. Please refer to the Current Medication list given to you today.  *If you need a refill on your cardiac medications before your next appointment, please call your pharmacy*   Lab Work: Your physician recommends that you return for lab work in: BMET with in the next week   If you have labs (blood work) drawn today and your tests are completely normal, you will receive your results only by: Marland Kitchen MyChart Message (if you have MyChart) OR . A paper copy in the mail If you have any lab test that is abnormal or we need to change your treatment, we will call you to review the results.   Testing/Procedures: CT of the neck    Follow-Up: At [facility], you and your health needs are our priority.  As part of our continuing mission to provide you with exceptional heart care, we have created designated Provider Care Teams.  These Care Teams include your primary Cardiologist (physician) and Advanced Practice Providers (APPs -  Physician Assistants and Nurse Practitioners) who all work together to provide you with the care you need, when you need it.  We recommend signing up for the patient portal called "MyChart".  Sign up information is provided on this After Visit Summary.  MyChart is used to connect with patients for Virtual Visits (Telemedicine).  Patients are able to view lab/test results, encounter notes, upcoming appointments, etc.  Non-urgent messages can be sent to your provider as well.   To learn more about what you can do with MyChart, go to [email or web address].    Your next appointment:   1 year(s)  The format for your next appointment:   In Person  Provider:   Dorris Carnes, MD   Other Instructions Thank you for choosing [facility]!

## 2020-08-16 ENCOUNTER — Ambulatory Visit: Payer: Medicaid Other | Admitting: Nurse Practitioner

## 2020-08-16 ENCOUNTER — Other Ambulatory Visit: Payer: Self-pay

## 2020-08-16 ENCOUNTER — Encounter: Payer: Self-pay | Admitting: Nurse Practitioner

## 2020-08-16 VITALS — BP 112/64 | HR 92 | Temp 99.0°F | Resp 18 | Ht 63.0 in | Wt 143.0 lb

## 2020-08-16 DIAGNOSIS — M545 Low back pain, unspecified: Secondary | ICD-10-CM

## 2020-08-16 DIAGNOSIS — Z7689 Persons encountering health services in other specified circumstances: Secondary | ICD-10-CM

## 2020-08-16 DIAGNOSIS — I10 Essential (primary) hypertension: Secondary | ICD-10-CM

## 2020-08-16 DIAGNOSIS — Z139 Encounter for screening, unspecified: Secondary | ICD-10-CM

## 2020-08-16 DIAGNOSIS — J4530 Mild persistent asthma, uncomplicated: Secondary | ICD-10-CM

## 2020-08-16 DIAGNOSIS — G8929 Other chronic pain: Secondary | ICD-10-CM

## 2020-08-16 NOTE — Assessment & Plan Note (Signed)
-  obtain records 

## 2020-08-16 NOTE — Assessment & Plan Note (Signed)
-  neck pain, upper back pain, and lower back pain -this is chronic -has been seen by ortho, [facility]

## 2020-08-16 NOTE — Assessment & Plan Note (Signed)
-  uses albuterol and advair

## 2020-08-16 NOTE — Assessment & Plan Note (Signed)
-  takes carvedilol, chlorthalidone, and losartan -LVEF 55-60% with last echo -doing well -followed by cardiology

## 2020-08-16 NOTE — Patient Instructions (Signed)
Please have fasting labs drawn 2-3 days prior to your appointment so we can discuss the results during your office visit.  

## 2020-08-16 NOTE — Progress Notes (Signed)
New Patient Office Visit  Subjective:  Patient ID: Destiny Manning, female    DOB: [birth year]  Age: 50 y.o. MRN: [id number]  CC:  Chief Complaint  Patient presents with  . New Patient (Initial Visit)    HPI Destiny Manning presents for new patient visit. Transferring care from [name]. She has been going to [facility] since Dr. [name] left. She is followed by [name] with GI and [facility] for GYN needs. She also sees [name] for cardiology, and she states she will see [name] soon.  No recent physical last was over a year ago.  She states that her labs were drawn about 6 months ago.  No acute concerns.  Past Medical History:  Diagnosis Date  . Asthma dx 06/04/07   ventolin, singulair  . Blood transfusion without reported diagnosis   . CHF (congestive heart failure) ([place]) 03/19/2008   a. 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 normalized by repeat imaging and at 55-60% in 04/2019,  . DCM (dilated cardiomyopathy) ([place]) dx 06/06/07   EF 10-15%  . Degeneration of spine   . DJD (degenerative joint disease), lumbar 11/16/2016   chronic lumbar pain  . GERD (gastroesophageal reflux disease)    takes Nexium Daily  . Hypertension   . Seasonal allergies    takes zyrtec    Past Surgical History:  Procedure Laterality Date  . ABDOMINAL HYSTERECTOMY     fibroids; partial- ovaries intact  . CERVICAL SPINE SURGERY  2002   [facility], ruptured disc  . CHOLECYSTECTOMY  2001   laparoscopic, [facility]  . COLONOSCOPY WITH PROPOFOL N/A 07/12/2017   tubular adenoma (10 mm polyp in cecum), segmental biopsies negative. 5 year surveillance  . ESOPHAGOGASTRODUODENOSCOPY (EGD) WITH ESOPHAGEAL DILATION N/A 09/05/2012   SRP:RXYVO hiatal hernia s/p esophageal dilation with 74 F Maloney.   . ESOPHAGOGASTRODUODENOSCOPY (EGD) WITH PROPOFOL N/A 07/12/2017   normal esophagus s/p dilation, normal duodenal bulb and second portion of duodenum  . MALONEY DILATION N/A  07/12/2017   Procedure: [name] DILATION;  Surgeon: [name], MD;  [facility];  Service: Endoscopy;  Laterality: N/A;  . POLYPECTOMY  07/12/2017   Procedure: POLYPECTOMY;  Surgeon: [name], MD;  [facility]: [facility];  Service: Endoscopy;;  Cecal polyp (HS)  . SPINE SURGERY    . TUBAL LIGATION    . VAGINAL HYSTERECTOMY  10/19/2010   Procedure: HYSTERECTOMY VAGINAL;  Surgeon: [name], MD;  Location: [facility];  Service: Gynecology;  Laterality: N/A;    Family History  Problem Relation Age of Onset  . Diabetes Mother   . Hypertension Mother   . Congestive Heart Failure Father 71  . Arthritis Father   . Dementia Maternal Grandmother   . Congestive Heart Failure Paternal Grandmother   . Congestive Heart Failure Paternal Grandfather   . Cancer Maternal Uncle   . Anesthesia problems Neg Hx   . Hypotension Neg Hx   . Pseudochol deficiency Neg Hx   . Malignant hyperthermia Neg Hx   . Colon cancer Neg Hx   . Colon polyps Neg Hx     Social History   Socioeconomic History  . Marital status: Single    Spouse name: Not on file  . Number of children: 2  . Years of education: 37  . Highest education level: Not on file  Occupational History  . Occupation: [employer]    CommentQuarry manager- works with IVC patients  Tobacco Use  . Smoking status: Former Smoker    Packs/day: 0.25    Years: 4.00    Pack years: 1.00    Types: Cigarettes    Quit date: 03/14/2009    Years since quitting: 11.4  . Smokeless tobacco: Never Used  Vaping Use  . Vaping Use: Never used  Substance and Sexual Activity  . Alcohol use: No  . Drug use: No  . Sexual activity: Not Currently    Birth control/protection: Surgical    Comment: hyst  Other Topics Concern  . 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 Difficulty of Paying Living Expenses: Somewhat hard  Food Insecurity: No Food Insecurity  . Worried About Charity fundraiser in the Last Year: Never true  . Ran Out of Food in the Last Year: Never true  Transportation Needs: No Transportation Needs  . Lack of Transportation (Medical): No  . Lack of Transportation (Non-Medical): No  Physical Activity: Inactive  . Days of Exercise per Week: 0 days  . Minutes of Exercise per Session: 0 min  Stress: No Stress Concern Present  . Feeling of Stress : Not at all  Social Connections: Moderately Isolated  . Frequency of Communication with Friends and Family: More than three times a week  . Frequency of Social Gatherings with Friends and Family: Never  . Attends Religious Services: More than 4 times per year  . Active Member of Clubs or Organizations: No  . Attends Archivist Meetings: Never  . Marital Status: Never married  Intimate Partner Violence: Not At Risk  . Fear of Current or Ex-Partner: No  . Emotionally Abused: No  . Physically Abused: No  . Sexually Abused: No    ROS Review of Systems  Constitutional: Negative.   Respiratory: Negative.   Cardiovascular: Negative.   Musculoskeletal: Positive for back pain.       Chronic  Psychiatric/Behavioral: Negative.     Objective:   Today's Vitals: BP 112/64   Pulse 92   Temp 99 F (37.2 C)   Resp 18   Ht _0  (1.6 m)   Wt 143 lb (64.9 kg)   LMP 07/04/2010   SpO2 97%   BMI 25.33 kg/m   Physical Exam Constitutional:      Appearance: Normal appearance.  Cardiovascular:     Rate and Rhythm: Normal rate and regular rhythm.     Pulses: Normal pulses.     Heart sounds: Normal heart sounds.  Pulmonary:     Effort: Pulmonary effort is normal.     Breath sounds: Normal breath sounds.  Neurological:     Mental Status: She is alert.  Psychiatric:        Mood and Affect: Mood normal.        Behavior: Behavior normal.        Thought Content:  Thought content normal.        Judgment: Judgment normal.     Assessment & Plan:   Problem List Items Addressed This Visit      Cardiovascular and Mediastinum   Hypertension    -takes carvedilol, chlorthalidone, and losartan -LVEF 55-60% with last echo -doing well -followed by cardiology        Respiratory   Asthma in  adult, mild persistent, uncomplicated    -uses albuterol and advair      Relevant Medications   albuterol (VENTOLIN HFA) 108 (90 Base) MCG/ACT inhaler     Other   Chronic lower back pain    -neck pain, upper back pain, and lower back pain -this is chronic -has been seen by ortho, [facility]      Relevant Medications   baclofen (LIORESAL) 10 MG tablet   Encounter to establish care    -obtain records      Relevant Orders   CBC with Differential/Platelet   CMP14+EGFR   Lipid Panel With LDL/HDL Ratio    Other Visit Diagnoses    Screening due    -  Primary   Relevant Orders   HCV Ab w/Rflx to Verification   Hepatitis C Antibody      Outpatient Encounter Medications as of 08/16/2020  Medication Sig  . albuterol (VENTOLIN HFA) 108 (90 Base) MCG/ACT inhaler Inhale 2 puffs into the lungs every 6 (six) hours as needed for wheezing or shortness of breath.  . AMITIZA 8 MCG capsule TAKE (1) CAPSULE BY MOUTH TWICE DAILY.  Marland Kitchen atorvastatin (LIPITOR) 10 MG tablet Take 1 tablet (10 mg total) by mouth daily.  . baclofen (LIORESAL) 10 MG tablet Take 10 mg by mouth 3 (three) times daily.  . carvedilol (COREG) 25 MG tablet Take 1 tablet (25 mg total) by mouth 2 (two) times daily.  . cetirizine (ZYRTEC) 10 MG tablet Take 1 tablet (10 mg total) by mouth daily.  . chlorthalidone (HYGROTON) 25 MG tablet Take 0.5 tablets (12.5 mg total) by mouth daily.  Marland Kitchen DEXILANT 60 MG capsule TAKE ONE CAPSULE BY MOUTH ONCE DAILY.  . fluticasone (FLONASE) 50 MCG/ACT nasal spray Place 1 spray daily into both nostrils. (Patient taking differently: Place 1 spray into both nostrils 2  (two) times daily.)  . Fluticasone-Salmeterol (ADVAIR) 500-50 MCG/DOSE AEPB Inhale 1 puff into the lungs 2 (two) times daily.  Marland Kitchen gabapentin (NEURONTIN) 300 MG capsule Take 1 capsule (300 mg total) 3 (three) times daily by mouth. (Patient taking differently: Take 300 mg by mouth 3 (three) times daily as needed (for pain.).)  . losartan (COZAAR) 100 MG tablet Take 1 tablet (100 mg total) by mouth daily.  . montelukast (SINGULAIR) 10 MG tablet Take 10 mg by mouth daily.   . naproxen (NAPROSYN) 500 MG tablet Take 1 tablet (500 mg total) by mouth every 12 (twelve) hours as needed. for pain  . SUMAtriptan (IMITREX) 50 MG tablet Take 50 mg by mouth every 2 (two) hours as needed for migraine. May repeat in 2 hours if headache persists or recurs.   No facility-administered encounter medications on file as of 08/16/2020.    Follow-up: Return in about 2 weeks (around 08/30/2020) for Physical Exam.   Noreene Larsson, NP

## 2020-08-30 ENCOUNTER — Ambulatory Visit (HOSPITAL_COMMUNITY)
Admission: RE | Admit: 2020-08-30 | Discharge: 2020-08-30 | Disposition: A | Discharge status: Home / Self Care | Payer: Medicaid Other | Source: Ambulatory Visit | Attending: Student | Admitting: Student

## 2020-08-30 ENCOUNTER — Other Ambulatory Visit: Payer: Self-pay

## 2020-08-30 DIAGNOSIS — R9389 Abnormal findings on diagnostic imaging of other specified body structures: Secondary | ICD-10-CM

## 2020-08-30 MED ORDER — IOHEXOL 350 MG/ML SOLN
75.0000 mL | Freq: Once | INTRAVENOUS | Status: AC | PRN
  Administered 2020-08-30: 75 mL via INTRAVENOUS

## 2020-09-07 ENCOUNTER — Other Ambulatory Visit: Payer: Self-pay | Admitting: Nurse Practitioner

## 2020-09-07 ENCOUNTER — Telehealth: Payer: Self-pay | Admitting: *Deleted

## 2020-09-07 DIAGNOSIS — I2510 Atherosclerotic heart disease of native coronary artery without angina pectoris: Secondary | ICD-10-CM

## 2020-09-07 DIAGNOSIS — E049 Nontoxic goiter, unspecified: Secondary | ICD-10-CM

## 2020-09-07 NOTE — Telephone Encounter (Signed)
Pt notified and order placed 

## 2020-09-07 NOTE — Telephone Encounter (Signed)
-----   Message from Fay Records, MD sent at 09/03/2020 12:03 PM EDT ----- [facility] [place] CT findings I would recomm  MRI/MRA of abdomen/pelvis to look at branch arteries ALso of brain to eval for stenoses/aneurysms

## 2020-09-08 LAB — CMP14+EGFR
ALT: 7 IU/L (ref 0–32)
AST: 14 IU/L (ref 0–40)
Albumin/Globulin Ratio: 1.4 (ref 1.2–2.2)
Albumin: 4 g/dL (ref 3.8–4.8)
Alkaline Phosphatase: 77 IU/L (ref 44–121)
BUN/Creatinine Ratio: 15 (ref 9–23)
BUN: 12 mg/dL (ref 6–24)
Bilirubin Total: 0.6 mg/dL (ref 0.0–1.2)
CO2: 25 mmol/L (ref 20–29)
Calcium: 9.1 mg/dL (ref 8.7–10.2)
Chloride: 97 mmol/L (ref 96–106)
Creatinine, Ser: 0.81 mg/dL (ref 0.57–1.00)
Globulin, Total: 2.9 g/dL (ref 1.5–4.5)
Glucose: 86 mg/dL (ref 65–99)
Potassium: 3.8 mmol/L (ref 3.5–5.2)
Sodium: 138 mmol/L (ref 134–144)
Total Protein: 6.9 g/dL (ref 6.0–8.5)
eGFR: 89 mL/min/{1.73_m2} (ref >59)

## 2020-09-08 LAB — LIPID PANEL WITH LDL/HDL RATIO
Cholesterol, Total: 156 mg/dL (ref 100–199)
HDL: 48 mg/dL (ref >39)
LDL Chol Calc (NIH): 88 mg/dL (ref 0–99)
LDL/HDL Ratio: 1.8 ratio (ref 0.0–3.2)
Triglycerides: 107 mg/dL (ref 0–149)
VLDL Cholesterol Cal: 20 mg/dL (ref 5–40)

## 2020-09-08 LAB — CBC WITH DIFFERENTIAL/PLATELET
Basophils Absolute: 0 10*3/uL (ref 0.0–0.2)
Basos: 1 %
EOS (ABSOLUTE): 0.1 10*3/uL (ref 0.0–0.4)
Eos: 1 %
Hematocrit: 44.5 % (ref 34.0–46.6)
Hemoglobin: 15.1 g/dL (ref 11.1–15.9)
Immature Grans (Abs): 0 10*3/uL (ref 0.0–0.1)
Immature Granulocytes: 0 %
Lymphocytes Absolute: 3.8 10*3/uL — ABNORMAL HIGH (ref 0.7–3.1)
Lymphs: 48 %
MCH: 30.3 pg (ref 26.6–33.0)
MCHC: 33.9 g/dL (ref 31.5–35.7)
MCV: 89 fL (ref 79–97)
Monocytes Absolute: 0.6 10*3/uL (ref 0.1–0.9)
Monocytes: 7 %
Neutrophils Absolute: 3.4 10*3/uL (ref 1.4–7.0)
Neutrophils: 43 %
Platelets: 288 10*3/uL (ref 150–450)
RBC: 4.98 x10E6/uL (ref 3.77–5.28)
RDW: 14.2 % (ref 11.7–15.4)
WBC: 7.9 10*3/uL (ref 3.4–10.8)

## 2020-09-08 LAB — HEPATITIS C ANTIBODY: Hep C Virus Ab: <0.1 s/co ratio (ref 0.0–0.9)

## 2020-09-09 ENCOUNTER — Other Ambulatory Visit: Payer: Self-pay

## 2020-09-09 ENCOUNTER — Ambulatory Visit (INDEPENDENT_AMBULATORY_CARE_PROVIDER_SITE_OTHER): Payer: Medicaid Other | Admitting: Nurse Practitioner

## 2020-09-09 ENCOUNTER — Encounter: Payer: Self-pay | Admitting: Nurse Practitioner

## 2020-09-09 VITALS — BP 130/77 | HR 90 | Temp 97.6°F | Ht 63.0 in | Wt 141.0 lb

## 2020-09-09 DIAGNOSIS — M545 Low back pain, unspecified: Secondary | ICD-10-CM | POA: Diagnosis not present

## 2020-09-09 DIAGNOSIS — Z139 Encounter for screening, unspecified: Secondary | ICD-10-CM | POA: Diagnosis not present

## 2020-09-09 DIAGNOSIS — I10 Essential (primary) hypertension: Secondary | ICD-10-CM | POA: Diagnosis not present

## 2020-09-09 DIAGNOSIS — M25512 Pain in left shoulder: Secondary | ICD-10-CM | POA: Insufficient documentation

## 2020-09-09 DIAGNOSIS — E049 Nontoxic goiter, unspecified: Secondary | ICD-10-CM

## 2020-09-09 DIAGNOSIS — G8929 Other chronic pain: Secondary | ICD-10-CM

## 2020-09-09 HISTORY — DX: Nontoxic goiter, unspecified: E04.9

## 2020-09-09 LAB — POCT I-STAT CREATININE: Creatinine, Ser: 0.9 mg/dL (ref 0.44–1.00)

## 2020-09-09 NOTE — Assessment & Plan Note (Signed)
-  getting worse in the last month -referral to ortho

## 2020-09-09 NOTE — Assessment & Plan Note (Signed)
BP well controlled today.

## 2020-09-09 NOTE — Assessment & Plan Note (Signed)
-  HCV negative -added HIV to labs that had been drawn

## 2020-09-09 NOTE — Assessment & Plan Note (Signed)
-  goiter found incidentally on neck CT -added thyroid panel to labs that were previously drawn -she would like referral to endocrinology for this; will honor that request

## 2020-09-09 NOTE — Progress Notes (Signed)
Established Patient Office Visit  Subjective:  Patient ID: Destiny Manning, female    DOB: [birth year]  Age: 50 y.o. MRN: [id number]  CC:  Chief Complaint  Patient presents with  . Annual Exam    CPE    HPI Destiny Manning presents for physical exam.  She had CT angio of her neck on 08/30/20, and she had thyroid goiter (14 mm right thyroid nodule and 12 mm left thyroid Nodule), and no further imaging was recommended on this.  She has chronic pain and stiffness. She states that she has been having burning pain that goes down the middle of her back.  She is having left shoulder pain and tingling in her left arm. This has been ongoing for at least 1 month. She feels pain worse when she is laying down at night.  She also has pain in both of her hips that radiates down her legs.  She thinks that her neck pain is triggering her migraines.  She has had neck injections with [facility], and she does not want further injections because they didn't help.  Past Medical History:  Diagnosis Date  . Asthma dx 06/04/07   ventolin, singulair  . Blood transfusion without reported diagnosis   . CHF (congestive heart failure) ([facility]) 03/19/2008   a. 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 normalized by repeat imaging and at 55-60% in 04/2019,  . DCM (dilated cardiomyopathy) ([facility]) dx 06/06/07   EF 10-15%  . Degeneration of spine   . DJD (degenerative joint disease), lumbar 11/16/2016   chronic lumbar pain  . GERD (gastroesophageal reflux disease)    takes Nexium Daily  . Goiter 09/09/2020  . Hypertension   . Seasonal allergies    takes zyrtec    Past Surgical History:  Procedure Laterality Date  . ABDOMINAL HYSTERECTOMY     fibroids; partial- ovaries intact  . CERVICAL SPINE SURGERY  2002   [facility], ruptured disc  . CHOLECYSTECTOMY  2001   laparoscopic, [facility]  . COLONOSCOPY WITH PROPOFOL N/A 07/12/2017   tubular adenoma (10 mm polyp in cecum), segmental biopsies negative. 5  year surveillance  . ESOPHAGOGASTRODUODENOSCOPY (EGD) WITH ESOPHAGEAL DILATION N/A 09/05/2012   ELX:IYHYZ hiatal hernia s/p esophageal dilation with 54 F Maloney.   . ESOPHAGOGASTRODUODENOSCOPY (EGD) WITH PROPOFOL N/A 07/12/2017   normal esophagus s/p dilation, normal duodenal bulb and second portion of duodenum  . MALONEY DILATION N/A 07/12/2017   Procedure: Elease Hashimoto DILATION;  Surgeon: Corbin Ade, MD;  [facility];  Service: Endoscopy;  Laterality: N/A;  . POLYPECTOMY  07/12/2017   Procedure: POLYPECTOMY;  Surgeon: Corbin Ade, MD;  Location: [facility];  Service: Endoscopy;;  Cecal polyp (HS)  . SPINE SURGERY    . TUBAL LIGATION    . VAGINAL HYSTERECTOMY  10/19/2010   Procedure: HYSTERECTOMY VAGINAL;  Surgeon: Lazaro Arms, MD;  Location: [facility];  Service: Gynecology;  Laterality: N/A;    Family History  Problem Relation Age of Onset  . Diabetes Mother   . Hypertension Mother   . Congestive Heart Failure Father 50  . Arthritis Father   . Dementia Maternal Grandmother   . Congestive Heart Failure Paternal Grandmother   . Congestive Heart Failure Paternal Grandfather   . Cancer Maternal Uncle   . Anesthesia problems Neg Hx   . Hypotension Neg Hx   . Pseudochol deficiency Neg Hx   . Malignant hyperthermia Neg Hx   . Colon cancer  Neg Hx   . Colon polyps Neg Hx     Social History   Socioeconomic History  . Marital status: Single    Spouse name: Not on file  . Number of children: 2  . Years of education: 41  . Highest education level: Not on file  Occupational History  . Occupation: [employer]    CommentQuarry manager- works with IVC patients  Tobacco Use  . Smoking status: Former Smoker    Packs/day: 0.25    Years: 4.00    Pack years: 1.00    Types: Cigarettes    Quit date: 03/14/2009    Years since quitting: 11.4  . Smokeless tobacco: Never Used  Vaping Use  . Vaping Use: Never used  Substance and Sexual Activity  . Alcohol use: No  . Drug use: No   . Sexual activity: Not Currently    Birth control/protection: Surgical    Comment: hyst  Other Topics Concern  . 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 Difficulty of Paying Living Expenses: Somewhat hard  Food Insecurity: No Food Insecurity  . Worried About Charity fundraiser in the Last Year: Never true  . Ran Out of Food in the Last Year: Never true  Transportation Needs: No Transportation Needs  . Lack of Transportation (Medical): No  . Lack of Transportation (Non-Medical): No  Physical Activity: Inactive  . Days of Exercise per Week: 0 days  . Minutes of Exercise per Session: 0 min  Stress: No Stress Concern Present  . Feeling of Stress : Not at all  Social Connections: Moderately Isolated  . Frequency of Communication with Friends and Family: More than three times a week  . Frequency of Social Gatherings with Friends and Family: Never  . Attends Religious Services: More than 4 times per year  . Active Member of Clubs or Organizations: No  . Attends Archivist Meetings: Never  . Marital Status: Never married  Intimate Partner Violence: Not At Risk  . Fear of Current or Ex-Partner: No  . Emotionally Abused: No  . Physically Abused: No  . Sexually Abused: No    Outpatient Medications Prior to Visit  Medication Sig Dispense Refill  . albuterol (VENTOLIN HFA) 108 (90 Base) MCG/ACT inhaler Inhale 2 puffs into the lungs every 6 (six) hours as needed for wheezing or shortness of breath.    . AMITIZA 8 MCG capsule TAKE (1) CAPSULE BY MOUTH TWICE DAILY. 60 capsule 5  . atorvastatin (LIPITOR) 10 MG tablet Take 1 tablet (10 mg total) by mouth daily. 90 tablet 3  . baclofen (LIORESAL) 10 MG tablet Take 10 mg by mouth 3 (three) times daily.    . carvedilol (COREG) 25 MG  tablet Take 1 tablet (25 mg total) by mouth 2 (two) times daily. 180 tablet 3  . cetirizine (ZYRTEC) 10 MG tablet Take 1 tablet (10 mg total) by mouth daily. 30 tablet 1  . chlorthalidone (HYGROTON) 25 MG tablet Take 0.5 tablets (12.5 mg total) by mouth daily. 45 tablet 3  . DEXILANT 60 MG capsule TAKE ONE CAPSULE BY MOUTH ONCE DAILY. 30 capsule 5  . fluticasone (FLONASE) 50 MCG/ACT nasal spray Place 1 spray daily into both nostrils. (Patient taking differently: Place 1 spray into both  nostrils 2 (two) times daily.) 16 g 3  . Fluticasone-Salmeterol (ADVAIR) 500-50 MCG/DOSE AEPB Inhale 1 puff into the lungs 2 (two) times daily.    Marland Kitchen gabapentin (NEURONTIN) 300 MG capsule Take 1 capsule (300 mg total) 3 (three) times daily by mouth. (Patient taking differently: Take 300 mg by mouth 3 (three) times daily as needed (for pain.).) 90 capsule 3  . losartan (COZAAR) 100 MG tablet Take 1 tablet (100 mg total) by mouth daily. 90 tablet 3  . montelukast (SINGULAIR) 10 MG tablet Take 10 mg by mouth daily.     . naproxen (NAPROSYN) 500 MG tablet Take 1 tablet (500 mg total) by mouth every 12 (twelve) hours as needed. for pain 60 tablet 2  . SUMAtriptan (IMITREX) 50 MG tablet Take 50 mg by mouth every 2 (two) hours as needed for migraine. May repeat in 2 hours if headache persists or recurs.     No facility-administered medications prior to visit.    No Known Allergies  ROS Review of Systems  Constitutional: Negative.   HENT: Negative.   Eyes: Negative.   Respiratory: Negative.   Cardiovascular: Negative.   Gastrointestinal: Negative.   Endocrine: Negative.   Genitourinary: Negative.   Musculoskeletal: Negative.   Skin: Negative.   Allergic/Immunologic: Negative.   Neurological: Positive for numbness.       Neuropathic pain to neck, midback, and both legs; Get numbness and tingling to left shoulder that radiates down her arm  Hematological: Negative.   Psychiatric/Behavioral: Negative.        Objective:    Physical Exam Constitutional:      Appearance: Normal appearance.  HENT:     Head: Normocephalic and atraumatic.     Right Ear: Tympanic membrane, ear canal and external ear normal.     Left Ear: Tympanic membrane, ear canal and external ear normal.     Nose: Nose normal.     Mouth/Throat:     Mouth: Mucous membranes are moist.     Pharynx: Oropharynx is clear.  Eyes:     Extraocular Movements: Extraocular movements intact.     Conjunctiva/sclera: Conjunctivae normal.     Pupils: Pupils are equal, round, and reactive to light.  Cardiovascular:     Rate and Rhythm: Normal rate and regular rhythm.     Pulses: Normal pulses.     Heart sounds: Normal heart sounds.  Pulmonary:     Effort: Pulmonary effort is normal.     Breath sounds: Normal breath sounds.  Abdominal:     General: Abdomen is flat. Bowel sounds are normal.     Palpations: Abdomen is soft.  Musculoskeletal:        General: Tenderness present. No swelling or deformity.     Comments: To lower back and over left scapula  Skin:    General: Skin is warm and dry.     Capillary Refill: Capillary refill takes less than 2 seconds.  Neurological:     General: No focal deficit present.     Mental Status: She is alert and oriented to person, place, and time.  Psychiatric:        Mood and Affect: Mood normal.        Behavior: Behavior normal.        Thought Content: Thought content normal.        Judgment: Judgment normal.     BP 130/77 (BP Location: Right Arm, Patient Position: Sitting, Cuff Size: Normal)   Pulse 90   Temp 97.6  F (36.4 C) (Temporal)   Ht $R'5\' 3"'Cz$  (1.6 m)   Wt 141 lb (64 kg)   LMP 07/04/2010   SpO2 97%   BMI 24.98 kg/m  Wt Readings from Last 3 Encounters:  09/09/20 141 lb (64 kg)  08/16/20 143 lb (64.9 kg)  08/03/20 143 lb (64.9 kg)     Health Maintenance Due  Topic Date Due  . HIV Screening  Never done    There are no preventive care reminders to display for this  patient.  No results found for: TSH Lab Results  Component Value Date   WBC 7.9 09/07/2020   HGB 15.1 09/07/2020   HCT 44.5 09/07/2020   MCV 89 09/07/2020   PLT 288 09/07/2020   Lab Results  Component Value Date   NA 138 09/07/2020   K 3.8 09/07/2020   CO2 25 09/07/2020   GLUCOSE 86 09/07/2020   BUN 12 09/07/2020   CREATININE 0.81 09/07/2020   BILITOT 0.6 09/07/2020   ALKPHOS 77 09/07/2020   AST 14 09/07/2020   ALT 7 09/07/2020   PROT 6.9 09/07/2020   ALBUMIN 4.0 09/07/2020   CALCIUM 9.1 09/07/2020   ANIONGAP 12 07/05/2017   EGFR 89 09/07/2020   Lab Results  Component Value Date   CHOL 156 09/07/2020   Lab Results  Component Value Date   HDL 48 09/07/2020   Lab Results  Component Value Date   LDLCALC 88 09/07/2020   Lab Results  Component Value Date   TRIG 107 09/07/2020   No results found for: CHOLHDL No results found for: HGBA1C    Assessment & Plan:   Problem List Items Addressed This Visit      Cardiovascular and Mediastinum   Hypertension    -BP well controlled today        Endocrine   Goiter - Primary    -goiter found incidentally on neck CT -added thyroid panel to labs that were previously drawn -she would like referral to endocrinology for this; will honor that request      Relevant Orders   TSH+T4F+T3Free   Ambulatory referral to Endocrinology     Other   Chronic lower back pain    -neck pain, upper back pain, and lower back pain -this is chronic -has been seen by ortho, [facility], but she would like to be seen by a different ortho -referred to ortho; if imaging necessitates or ortho recommends, may consider neurosurgery or physical medicine      Relevant Orders   Ambulatory referral to Orthopedic Surgery   Screening due    -HCV negative -added HIV to labs that had been drawn      Left shoulder pain    -getting worse in the last month -referral to ortho      Relevant Orders   Ambulatory referral to Orthopedic  Surgery      No orders of the defined types were placed in this encounter.   Follow-up: Return in about 6 weeks (around 10/21/2020) for Follow-up for back/neck/shoulder pain.    Noreene Larsson, NP

## 2020-09-09 NOTE — Assessment & Plan Note (Addendum)
-  neck pain, upper back pain, and lower back pain -this is chronic -has been seen by ortho, [facility], but she would like to be seen by a different ortho -referred to ortho; if imaging necessitates or ortho recommends, may consider neurosurgery or physical medicine

## 2020-09-11 LAB — T3, FREE: T3, Free: 2.6 pg/mL (ref 2.0–4.4)

## 2020-09-11 LAB — TSH+FREE T4
Free T4: 1.41 ng/dL (ref 0.82–1.77)
TSH: 0.504 u[IU]/mL (ref 0.450–4.500)

## 2020-09-11 LAB — SPECIMEN STATUS REPORT

## 2020-09-13 NOTE — Progress Notes (Signed)
Labs look great.

## 2020-09-15 ENCOUNTER — Encounter: Payer: Medicaid Other | Admitting: Nurse Practitioner

## 2020-09-16 NOTE — Addendum Note (Signed)
Addended by: Levonne Hubert on: 09/16/2020 03:45 PM   Modules accepted: Orders

## 2020-09-17 ENCOUNTER — Other Ambulatory Visit: Payer: Self-pay

## 2020-09-21 ENCOUNTER — Encounter: Payer: Self-pay | Admitting: Orthopaedic Surgery

## 2020-09-21 ENCOUNTER — Ambulatory Visit: Payer: Medicaid Other | Admitting: Orthopaedic Surgery

## 2020-09-21 ENCOUNTER — Other Ambulatory Visit: Payer: Self-pay

## 2020-09-21 ENCOUNTER — Ambulatory Visit: Payer: Medicaid Other

## 2020-09-21 VITALS — BP 134/81 | HR 83 | Ht 63.0 in | Wt 142.0 lb

## 2020-09-21 DIAGNOSIS — M545 Low back pain, unspecified: Secondary | ICD-10-CM

## 2020-09-21 NOTE — Progress Notes (Signed)
Subjective:    Patient ID: Destiny Manning, female    DOB: 09-02-1970, 50 y.o.   MRN: [id number]  HPI She has long history of neck and lower back pain.  She has had surgery on her neck many years ago with fusion by Dr. Joya Salm.  She has chronic neck pain.  She is on neurotin for this.  She has no trauma.  She also has long history of lower back pain treated at [facility], now [facility].  She was last seen there in 2019.  She has pain getting worse in the lower back over the last year.  She has no weakness, no numbness.  She was seen by [facility] and they asked that I see her.  I have reviewed the notes.  She also has Goiter.     Review of Systems  Constitutional:  Positive for activity change.  Respiratory:  Positive for shortness of breath.   Musculoskeletal:  Positive for arthralgias, back pain, myalgias and neck pain.  All other systems reviewed and are negative. For Review of Systems, all other systems reviewed and are negative.  The following is a summary of the past history medically, past history surgically, known current medicines, social history and family history.  This information is gathered electronically by the computer from prior information and documentation.  I review this each visit and have found including this information at this point in the chart is beneficial and informative.   Past Medical History:  Diagnosis Date   Asthma dx 06/04/07   ventolin, singulair   Blood transfusion without reported diagnosis    CHF (congestive heart failure) ([place]) 03/19/2008   a. 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 normalized by repeat imaging and at 55-60% in 04/2019,   DCM (dilated cardiomyopathy) ([place]) dx 06/06/07   EF 10-15%   Degeneration of spine    DJD (degenerative joint disease), lumbar 11/16/2016   chronic lumbar pain   GERD (gastroesophageal reflux disease)    takes Nexium Daily   Goiter 09/09/2020   Hypertension     Seasonal allergies    takes zyrtec    Past Surgical History:  Procedure Laterality Date   ABDOMINAL HYSTERECTOMY     fibroids; partial- ovaries intact   CERVICAL SPINE SURGERY  2002   [facility], ruptured disc   CHOLECYSTECTOMY  2001   laparoscopic, [facility]   COLONOSCOPY WITH PROPOFOL N/A 07/12/2017   tubular adenoma (10 mm polyp in cecum), segmental biopsies negative. 5 year surveillance   ESOPHAGOGASTRODUODENOSCOPY (EGD) WITH ESOPHAGEAL DILATION N/A 09/05/2012   ZCH:YIFOY hiatal hernia s/p esophageal dilation with 11 F Maloney.    ESOPHAGOGASTRODUODENOSCOPY (EGD) WITH PROPOFOL N/A 07/12/2017   normal esophagus s/p dilation, normal duodenal bulb and second portion of duodenum   MALONEY DILATION N/A 07/12/2017   Procedure: MALONEY DILATION;  Surgeon: [name], MD;  [facility];  Service: Endoscopy;  Laterality: N/A;   POLYPECTOMY  07/12/2017   Procedure: POLYPECTOMY;  Surgeon: [name], MD;  Location: [facility];  Service: Endoscopy;;  Cecal polyp (HS)   SPINE SURGERY     TUBAL LIGATION     VAGINAL HYSTERECTOMY  10/19/2010   Procedure: HYSTERECTOMY VAGINAL;  Surgeon: [name], MD;  Location: [facility] OR[facility];  Service: Gynecology;  Laterality: N/A;    Current Outpatient Medications on File Prior to Visit  Medication Sig Dispense Refill   albuterol (VENTOLIN HFA) 108 (90 Base) MCG/ACT inhaler Inhale 2 puffs  into the lungs every 6 (six) hours as needed for wheezing or shortness of breath.     AMITIZA 8 MCG capsule TAKE (1) CAPSULE BY MOUTH TWICE DAILY. 60 capsule 5   atorvastatin (LIPITOR) 10 MG tablet Take 1 tablet (10 mg total) by mouth daily. 90 tablet 3   baclofen (LIORESAL) 10 MG tablet Take 10 mg by mouth 3 (three) times daily.     carvedilol (COREG) 25 MG tablet Take 1 tablet (25 mg total) by mouth 2 (two) times daily. 180 tablet 3   cetirizine (ZYRTEC) 10 MG tablet Take 1 tablet (10 mg total) by mouth daily. 30 tablet 1   chlorthalidone (HYGROTON) 25 MG tablet Take 0.5  tablets (12.5 mg total) by mouth daily. 45 tablet 3   DEXILANT 60 MG capsule TAKE ONE CAPSULE BY MOUTH ONCE DAILY. 30 capsule 5   fluticasone (FLONASE) 50 MCG/ACT nasal spray Place 1 spray daily into both nostrils. (Patient taking differently: Place 1 spray into both nostrils 2 (two) times daily.) 16 g 3   Fluticasone-Salmeterol (ADVAIR) 500-50 MCG/DOSE AEPB Inhale 1 puff into the lungs 2 (two) times daily.     gabapentin (NEURONTIN) 300 MG capsule Take 1 capsule (300 mg total) 3 (three) times daily by mouth. (Patient taking differently: Take 300 mg by mouth 3 (three) times daily as needed (for pain.).) 90 capsule 3   losartan (COZAAR) 100 MG tablet Take 1 tablet (100 mg total) by mouth daily. 90 tablet 3   montelukast (SINGULAIR) 10 MG tablet Take 10 mg by mouth daily.      naproxen (NAPROSYN) 500 MG tablet Take 1 tablet (500 mg total) by mouth every 12 (twelve) hours as needed. for pain 60 tablet 2   SUMAtriptan (IMITREX) 50 MG tablet Take 50 mg by mouth every 2 (two) hours as needed for migraine. May repeat in 2 hours if headache persists or recurs.     No current facility-administered medications on file prior to visit.    Social History   Socioeconomic History   Marital status: Single    Spouse name: Not on file   Number of children: 2   Years of education: 16   Highest education level: Not on file  Occupational History   Occupation: [employer]    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  Tobacco Use   Smoking status: Former    Packs/day: 0.25    Years: 4.00    Pack years: 1.00    Types: Cigarettes    Quit date: 03/14/2009    Years since quitting: 11.5   Smokeless tobacco: Never  Vaping Use   Vaping Use: Never used  Substance and Sexual Activity   Alcohol use: No   Drug use: No   Sexual activity: Not Currently    Birth control/protection: Surgical    Comment: hyst  Other Topics Concern   Not on file  Social History Narrative   Actuary at [employer]; 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      Lives with 2 children- age 41 and 51; she homeschools them   Social Determinants of Health   Financial Resource Strain: Medium Risk   Difficulty of Paying Living Expenses: Somewhat hard  Food Insecurity: No Food Insecurity   Worried About Charity fundraiser in the Last Year: Never true   Ran Out of Food in the Last Year: Never true  Transportation Needs: No Transportation Needs   Lack of Transportation (Medical): No   Lack of Transportation (Non-Medical): No  Physical Activity: Inactive   Days of Exercise per Week: 0 days   Minutes of Exercise per Session: 0 min  Stress: No Stress Concern Present   Feeling of Stress : Not at all  Social Connections: Moderately Isolated   Frequency of Communication with Friends and Family: More than three times a week   Frequency of Social Gatherings with Friends and Family: Never   Attends Religious Services: More than 4 times per year   Active Member of [employer] or Organizations: No   Attends Music therapist: Never   Marital Status: Never married  Human resources officer Violence: Not At Risk   Fear of Current or Ex-Partner: No   Emotionally Abused: No   Physically Abused: No   Sexually Abused: No    Family History  Problem Relation Age of Onset   Diabetes Mother    Hypertension Mother    Congestive Heart Failure Father 18   Arthritis Father    Dementia Maternal Grandmother    Congestive Heart Failure Paternal Grandmother    Congestive Heart Failure Paternal Grandfather    Cancer Maternal Uncle    Anesthesia problems Neg Hx    Hypotension Neg Hx    Pseudochol deficiency Neg Hx    Malignant hyperthermia Neg Hx    Colon cancer Neg Hx    Colon polyps Neg Hx     BP 134/81   Pulse 83   Ht 5\' 3"  (1.6 m)   Wt 142 lb (64.4 kg)   LMP 07/04/2010   BMI 25.15 kg/m   Body mass index is 25.15 kg/m.     Objective:   Physical Exam Vitals and nursing note reviewed. Exam conducted with a chaperone present.   Constitutional:      Appearance: She is well-developed.  HENT:     Head: Normocephalic and atraumatic.  Eyes:     Conjunctiva/sclera: Conjunctivae normal.     Pupils: Pupils are equal, round, and reactive to light.  Cardiovascular:     Rate and Rhythm: Normal rate and regular rhythm.  Pulmonary:     Effort: Pulmonary effort is normal.  Abdominal:     Palpations: Abdomen is soft.  Musculoskeletal:       Arms:     Cervical back: Normal range of motion and neck supple.  Skin:    General: Skin is warm and dry.  Neurological:     Mental Status: She is alert and oriented to person, place, and time.     Cranial Nerves: No cranial nerve deficit.     Motor: No abnormal muscle tone.     Coordination: Coordination normal.     Deep Tendon Reflexes: Reflexes are normal and symmetric. Reflexes normal.  Psychiatric:        Behavior: Behavior normal.        Thought Content: Thought content normal.        Judgment: Judgment normal.    X-rays were done of the lumbar spine, reported separately.      Assessment & Plan:   Encounter Diagnosis  Name Primary?   Lumbar spine pain Yes   I have given exercises to do.  Return in six weeks.  Continue present medicine.  Consider MRI on return.  Call if any problem.  Precautions discussed. Electronically Signed Sanjuana Kava, MD 6/14/202211:52 AM

## 2020-09-23 ENCOUNTER — Telehealth: Payer: Self-pay | Admitting: Orthopaedic Surgery

## 2020-09-23 ENCOUNTER — Ambulatory Visit (INDEPENDENT_AMBULATORY_CARE_PROVIDER_SITE_OTHER): Payer: Medicaid Other | Admitting: "Endocrinology

## 2020-09-23 ENCOUNTER — Encounter: Payer: Self-pay | Admitting: "Endocrinology

## 2020-09-23 VITALS — BP 119/77 | HR 86 | Ht 63.0 in | Wt 141.8 lb

## 2020-09-23 DIAGNOSIS — E042 Nontoxic multinodular goiter: Secondary | ICD-10-CM

## 2020-09-23 NOTE — Telephone Encounter (Signed)
Called patient upon receipt of notes which Dr Luna Glasgow had discussed at time of visit from her previous orthopaedist at [facility]. Patient requested through their app, and records were received today by mail. Patient aware Dr Luna Glasgow will review when he returns to clinic next week. She also states that if any records are missing, and/or if films are needed, to let her know, as she has also signed a release here at our clinic.

## 2020-09-23 NOTE — Progress Notes (Signed)
Endocrinology Consult Note                                            09/23/2020, 6:38 PM   Subjective:    Patient ID: Destiny Manning, female    DOB: [birth year], PCP Noreene Larsson, NP   Past Medical History:  Diagnosis Date   Asthma dx 06/04/07   ventolin, singulair   Blood transfusion without reported diagnosis    CHF (congestive heart failure) ([place]) 03/19/2008   a. 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 normalized by repeat imaging and at 55-60% in 04/2019,   DCM (dilated cardiomyopathy) ([place]) dx 06/06/07   EF 10-15%   Degeneration of spine    DJD (degenerative joint disease), lumbar 11/16/2016   chronic lumbar pain   GERD (gastroesophageal reflux disease)    takes Nexium Daily   Goiter 09/09/2020   Hypertension    Seasonal allergies    takes zyrtec   Past Surgical History:  Procedure Laterality Date   ABDOMINAL HYSTERECTOMY     fibroids; partial- ovaries intact   CERVICAL SPINE SURGERY  2002   [facility], ruptured disc   CHOLECYSTECTOMY  2001   laparoscopic, [facility]   COLONOSCOPY WITH PROPOFOL N/A 07/12/2017   tubular adenoma (10 mm polyp in cecum), segmental biopsies negative. 5 year surveillance   ESOPHAGOGASTRODUODENOSCOPY (EGD) WITH ESOPHAGEAL DILATION N/A 09/05/2012   RCV:ELFYB hiatal hernia s/p esophageal dilation with 12 F Maloney.    ESOPHAGOGASTRODUODENOSCOPY (EGD) WITH PROPOFOL N/A 07/12/2017   normal esophagus s/p dilation, normal duodenal bulb and second portion of duodenum   MALONEY DILATION N/A 07/12/2017   Procedure: MALONEY DILATION;  Surgeon: [name], MD;  [facility];  Service: Endoscopy;  Laterality: N/A;   POLYPECTOMY  07/12/2017   Procedure: POLYPECTOMY;  Surgeon: [name], MD;  Location[facility];  Service: Endoscopy;;  Cecal polyp (HS)   SPINE SURGERY     TUBAL LIGATION     VAGINAL HYSTERECTOMY  10/19/2010   Procedure: HYSTERECTOMY VAGINAL;  Surgeon: [name], MD;  Location: [facility];  Service:  Gynecology;  Laterality: N/A;   Social History   Socioeconomic History   Marital status: Single    Spouse name: Not on file   Number of children: 2   Years of education: 16   Highest education level: Not on file  Occupational History   Occupation: [employer]    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  Tobacco Use   Smoking status: Former    Packs/day: 0.25    Years: 4.00    Pack years: 1.00    Types: Cigarettes    Quit date: 03/14/2009    Years since quitting: 11.5   Smokeless tobacco: Never  Vaping Use   Vaping Use: Never used  Substance and Sexual Activity   Alcohol use: No   Drug use: No   Sexual activity: Not Currently    Birth control/protection: Surgical    Comment: hyst  Other Topics Concern   Not on file  Social History Narrative   Actuary at [employer]; 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      Lives with 2 children- age 13 and 54; she homeschools them   Social Determinants of Health   Financial Resource Strain: Medium Risk   Difficulty of Paying Living Expenses: Somewhat hard  Food Insecurity: No Food Insecurity   Worried About Charity fundraiser in the Last Year: Never true   Ran Out of Food in the Last Year: Never true  Transportation Needs: No Transportation Needs   Lack of Transportation (Medical): No   Lack of Transportation (Non-Medical): No  Physical Activity: Inactive   Days of Exercise per Week: 0 days   Minutes of Exercise per Session: 0 min  Stress: No Stress Concern Present   Feeling of Stress : Not at all  Social Connections: Moderately Isolated   Frequency of Communication with Friends and Family: More than three times a week   Frequency of Social Gatherings with Friends and Family: Never   Attends Religious Services: More than 4 times per year   Active Member of [employer] or Organizations: No   Attends Music therapist: Never   Marital Status: Never married   Family History  Problem Relation Age of Onset   Diabetes  Mother    Hypertension Mother    Congestive Heart Failure Father 11   Arthritis Father    Dementia Maternal Grandmother    Congestive Heart Failure Paternal Grandmother    Congestive Heart Failure Paternal Grandfather    Cancer Maternal Uncle    Anesthesia problems Neg Hx    Hypotension Neg Hx    Pseudochol deficiency Neg Hx    Malignant hyperthermia Neg Hx    Colon cancer Neg Hx    Colon polyps Neg Hx    Outpatient Encounter Medications as of 09/23/2020  Medication Sig   albuterol (VENTOLIN HFA) 108 (90 Base) MCG/ACT inhaler Inhale 2 puffs into the lungs every 6 (six) hours as needed for wheezing or shortness of breath.   AMITIZA 8 MCG capsule TAKE (1) CAPSULE BY MOUTH TWICE DAILY.   atorvastatin (LIPITOR) 10 MG tablet Take 1 tablet (10 mg total) by mouth daily.   baclofen (LIORESAL) 10 MG tablet Take 10 mg by mouth 3 (three) times daily.   carvedilol (COREG) 25 MG tablet Take 1 tablet (25 mg total) by mouth 2 (two) times daily.   cetirizine (ZYRTEC) 10 MG tablet Take 1 tablet (10 mg total) by mouth daily.   chlorthalidone (HYGROTON) 25 MG tablet Take 0.5 tablets (12.5 mg total) by mouth daily.   DEXILANT 60 MG capsule TAKE ONE CAPSULE BY MOUTH ONCE DAILY.   fluticasone (FLONASE) 50 MCG/ACT nasal spray Place 1 spray daily into both nostrils. (Patient taking differently: Place 1 spray into both nostrils 2 (two) times daily.)   Fluticasone-Salmeterol (ADVAIR) 500-50 MCG/DOSE AEPB Inhale 1 puff into the lungs 2 (two) times daily.   gabapentin (NEURONTIN) 300 MG capsule Take 1 capsule (300 mg total) 3 (three) times daily by mouth. (Patient taking differently: Take 300 mg by mouth 3 (three) times daily as needed (for pain.).)   losartan (COZAAR) 100 MG tablet Take 1 tablet (100 mg total) by mouth daily.   montelukast (SINGULAIR) 10 MG tablet Take 10 mg by mouth daily.    naproxen (NAPROSYN) 500 MG tablet Take 1 tablet (500 mg total) by mouth every 12 (twelve) hours as needed. for pain    SUMAtriptan (IMITREX) 50 MG tablet Take 50 mg by mouth every 2 (two) hours as needed for migraine. May repeat in 2 hours if headache persists or recurs.   No facility-administered encounter medications on file as of 09/23/2020.   ALLERGIES: No Known Allergies  VACCINATION STATUS: Immunization History  Administered Date(s) Administered   Influenza,inj,Quad PF,6+ Mos 02/19/2017  Moderna Sars-Covid-2 Vaccination 06/25/2019, 07/30/2019, 05/11/2020, 07/29/2020    HPI CAMBELL RICKENBACH is 50 y.o. female who presents today with a medical history as above. she is being seen in consultation for multinodular goiter requested by Noreene Larsson, NP.   History is obtained directly from the patient and chart review.  She denies any prior history of thyroid dysfunction.  She was undergoing carotid Doppler when she was found to have incidental thyroid nodules bilaterally.  On Aug 30, 2020 she was found to have a 14 mm nodule on the right lobe and 12 mm nodule on the left lobe.  She denies dysphagia, shortness of breath, nor voice change.  She denies tremors, palpitations, nor weight fluctuations.  She denies any family history of thyroid malignancy nor thyroid dysfunction. Her recent thyroid function tests are consistent with euthyroid state. She is not on antithyroid medications, nor thyroid hormone supplements.  She is not a smoker.  Her medical history includes well-controlled hypertension, hyperlipidemia, CHF.  Review of Systems  Constitutional: no recent weight gain/loss, no fatigue, no subjective hyperthermia, no subjective hypothermia Eyes: no blurry vision, no xerophthalmia ENT: no sore throat, no nodules palpated in throat, no dysphagia/odynophagia, no hoarseness Cardiovascular: no Chest Pain, no Shortness of Breath, no palpitations, no leg swelling Respiratory: no cough, no shortness of breath Gastrointestinal: no Nausea/Vomiting/Diarhhea Musculoskeletal: no muscle/joint aches Skin: no  rashes Neurological: no tremors, no numbness, no tingling, no dizziness Psychiatric: no depression, no anxiety  Objective:    Vitals with BMI 09/23/2020 09/21/2020 09/09/2020  Height 5\' 3"  5\' 3"  5\' 3"   Weight 141 lbs 13 oz 142 lbs 141 lbs  BMI 25.13 21.19 41.74  Systolic 081 448 185  Diastolic 77 81 77  Pulse 86 83 90    BP 119/77   Pulse 86   Ht 5\' 3"  (1.6 m)   Wt 141 lb 12.8 oz (64.3 kg)   LMP 07/04/2010   BMI 25.12 kg/m   Wt Readings from Last 3 Encounters:  09/23/20 141 lb 12.8 oz (64.3 kg)  09/21/20 142 lb (64.4 kg)  09/09/20 141 lb (64 kg)    Physical Exam  Constitutional:  Body mass index is 25.12 kg/m.,  not in acute distress, normal state of mind Eyes: PERRLA, EOMI, no exophthalmos ENT: moist mucous membranes, + palpable, normal  size thyroid , no gross cervical lymphadenopathy Cardiovascular: normal precordial activity, Regular Rate and Rhythm, no Murmur/Rubs/Gallops Respiratory:  adequate breathing efforts, no gross chest deformity, Clear to auscultation bilaterally Gastrointestinal: abdomen soft, Non -tender, No distension, Bowel Sounds present, no gross organomegaly Musculoskeletal: no gross deformities, strength intact in all four extremities Skin: moist, warm, no rashes Neurological: no tremor with outstretched hands, Deep tendon reflexes normal in bilateral lower extremities.  CMP ( most recent) CMP     Component Value Date/Time   NA 138 09/07/2020 1039   K 3.8 09/07/2020 1039   CL 97 09/07/2020 1039   CO2 25 09/07/2020 1039   GLUCOSE 86 09/07/2020 1039   GLUCOSE 94 07/05/2017 0912   BUN 12 09/07/2020 1039   CREATININE 0.81 09/07/2020 1039   CALCIUM 9.1 09/07/2020 1039   PROT 6.9 09/07/2020 1039   ALBUMIN 4.0 09/07/2020 1039   AST 14 09/07/2020 1039   ALT 7 09/07/2020 1039   ALKPHOS 77 09/07/2020 1039   BILITOT 0.6 09/07/2020 1039   GFRNONAA >60 07/05/2017 0912   GFRAA >60 07/05/2017 0912       Lipid Panel ( most recent) Lipid Panel  Component Value Date/Time   CHOL 156 09/07/2020 1039   TRIG 107 09/07/2020 1039   HDL 48 09/07/2020 1039   LDLCALC 88 09/07/2020 1039   LABVLDL 20 09/07/2020 1039      Lab Results  Component Value Date   TSH 0.504 09/07/2020   FREET4 1.41 09/07/2020     Assessment & Plan:   1. Multinodular goiter   - ANJOLI DIEMER  is being seen at a kind request of Noreene Larsson, NP. - I have reviewed her available thyroid records and clinically evaluated the patient. - Based on these reviews, she has euthyroid multinodular goiter. However, her findings are not significant for definitive treatment immediately.  She would not need biopsy or intervention at this time.  However, she will be considered for dedicated thyroid ultrasound before her next visit in 1 year. I discussed her normal thyroid labs not needing any prescriptions. - I did not initiate any new prescriptions today. -She is advised to continue her other medications for hypertension, hyperlipidemia, and CHF. - she is advised to maintain close follow up with Noreene Larsson, NP for primary care needs.   - Time spent with the patient: 60 minutes, of which >50% was spent in  counseling her about her multinodular goiter and the rest in obtaining information about her symptoms, reviewing her previous labs/studies ( including abstractions from other facilities),  evaluations, and treatments,  and developing a plan to confirm diagnosis and long term treatment based on the latest standards of care/guidelines; and documenting her care.  Destiny Manning participated in the discussions, expressed understanding, and voiced agreement with the above plans.  All questions were answered to her satisfaction. she is encouraged to contact clinic should she have any questions or concerns prior to her return visit.  Follow up plan: Return in about 1 year (around 09/23/2021) for Thyroid / Neck Ultrasound.   Glade Lloyd, MD [facility]  [facility] [address] [place], [place] [address] Phone: [phone]  Fax: [phone]     09/23/2020, 6:38 PM  This note was partially dictated with voice recognition software. Similar sounding words can be transcribed inadequately or may not  be corrected upon review.

## 2020-10-05 ENCOUNTER — Ambulatory Visit (HOSPITAL_COMMUNITY)
Admission: RE | Admit: 2020-10-05 | Discharge: 2020-10-05 | Disposition: A | Discharge status: Home / Self Care | Payer: Medicaid Other | Source: Ambulatory Visit | Attending: Internal Medicine | Admitting: Internal Medicine

## 2020-10-05 ENCOUNTER — Other Ambulatory Visit: Payer: Self-pay

## 2020-10-05 DIAGNOSIS — I2510 Atherosclerotic heart disease of native coronary artery without angina pectoris: Secondary | ICD-10-CM | POA: Diagnosis not present

## 2020-10-05 MED ORDER — GADOBUTROL 1 MMOL/ML IV SOLN
7.0000 mL | Freq: Once | INTRAVENOUS | Status: AC | PRN
  Administered 2020-10-05: 7 mL via INTRAVENOUS

## 2020-10-08 ENCOUNTER — Other Ambulatory Visit: Payer: Self-pay | Admitting: *Deleted

## 2020-10-08 DIAGNOSIS — R930 Abnormal findings on diagnostic imaging of skull and head, not elsewhere classified: Secondary | ICD-10-CM

## 2020-10-12 ENCOUNTER — Telehealth: Payer: Self-pay | Admitting: Orthopaedic Surgery

## 2020-10-12 NOTE — Telephone Encounter (Signed)
I called her to discuss She does have a follow up scheduled understands we will order the MRI then if she is not any better, and she said she wants to RS her appointment Can you call to RS ?

## 2020-10-12 NOTE — Telephone Encounter (Signed)
MRI not ordered yet, he will order at follow up per office visit note.   Does she have a follow up? She needs one

## 2020-10-12 NOTE — Telephone Encounter (Signed)
Patient called and wants to know if her MRI has been approved, she has not heard anything.   Please call her back on her home number

## 2020-10-21 ENCOUNTER — Ambulatory Visit: Payer: Medicaid Other | Admitting: Nurse Practitioner

## 2020-10-22 NOTE — Telephone Encounter (Signed)
Reschedule done at that time. *update* I called patient, left message to return call, regarding records request. Patient had requested previous notes/reports from [facility] via their app. Records are primarily Cervical spine MRI from 08/30/16. They have been in Dr Brooke Bonito box to have for next appointment (11/04/20), however, I called patient to ask for date range for any other possible notes, as we may need to re-request some records. Left message on her voice mail to return call.

## 2020-10-25 ENCOUNTER — Ambulatory Visit (HOSPITAL_COMMUNITY): Payer: Medicaid Other

## 2020-10-25 ENCOUNTER — Encounter (HOSPITAL_COMMUNITY): Payer: Self-pay

## 2020-10-28 NOTE — Telephone Encounter (Signed)
Done

## 2020-11-02 ENCOUNTER — Encounter: Payer: Self-pay | Admitting: Adult Health

## 2020-11-02 ENCOUNTER — Ambulatory Visit (INDEPENDENT_AMBULATORY_CARE_PROVIDER_SITE_OTHER): Payer: Medicaid Other | Admitting: Adult Health

## 2020-11-02 ENCOUNTER — Ambulatory Visit: Payer: Medicaid Other | Admitting: Orthopaedic Surgery

## 2020-11-02 ENCOUNTER — Other Ambulatory Visit: Payer: Self-pay

## 2020-11-02 VITALS — BP 124/73 | HR 80 | Ht 63.0 in | Wt 142.5 lb

## 2020-11-02 DIAGNOSIS — E049 Nontoxic goiter, unspecified: Secondary | ICD-10-CM | POA: Diagnosis not present

## 2020-11-02 DIAGNOSIS — Z01419 Encounter for gynecological examination (general) (routine) without abnormal findings: Secondary | ICD-10-CM | POA: Insufficient documentation

## 2020-11-02 DIAGNOSIS — R102 Pelvic and perineal pain: Secondary | ICD-10-CM | POA: Diagnosis not present

## 2020-11-02 DIAGNOSIS — Z1211 Encounter for screening for malignant neoplasm of colon: Secondary | ICD-10-CM | POA: Insufficient documentation

## 2020-11-02 LAB — HEMOCCULT GUIAC POC 1CARD (OFFICE): Fecal Occult Blood, POC: NEGATIVE

## 2020-11-02 NOTE — Progress Notes (Signed)
Patient ID: Destiny Manning, female   DOB: Dec 27, 1970, 50 y.o.   MRN: [id number] History of Present Illness: Angelinne is a 50 year old black female,single, sp hysterectomy in for a well woman gyn exam.She is complaining of pelvic pain, hurst to sit, walk and stand at times. Had [place] in 2020 that showed possible adhesions.  Lab Results  Component Value Date   DIAGPAP  10/28/2018    NEGATIVE FOR INTRAEPITHELIAL LESIONS OR MALIGNANCY.   HPV NOT Detected 10/28/2018      Current Medications, Allergies, Past Medical History, Past Surgical History, Family History and Social History were reviewed in [employer] record.     Review of Systems: Patient denies any headaches, hearing loss, fatigue, blurred vision, shortness of breath, chest pain, abdominal pain, problems with bowel movements, urination, or intercourse.(Not active). No joint pain or mood swings.  Has pelvic pain   Physical Exam:BP 124/73 (BP Location: Left Arm, Patient Position: Sitting, Cuff Size: Normal)   Pulse 80   Ht '5\' 3"'$  (1.6 m)   Wt 142 lb 8 oz (64.6 kg)   LMP 07/04/2010   BMI 25.24 kg/m   General:  Well developed, well nourished, no acute distress Skin:  Warm and dry Neck:  Midline trachea, enlarged thyroid, good ROM, no lymphadenopathy Lungs; Clear to auscultation bilaterally Breast:  No dominant palpable mass, retraction, or nipple discharge Cardiovascular: Regular rate and rhythm Abdomen:  Soft, non tender, no hepatosplenomegaly Pelvic:  External genitalia is normal in appearance, no lesions.  The vagina is normal in appearance. Urethra has no lesions or masses. The cervix and uterus are absent. No adnexal masses,+ tenderness noted.Bladder is non tender, no masses felt. Rectal: Good sphincter tone, no polyps, or hemorrhoids felt.  Hemoccult negative. Extremities/musculoskeletal:  No swelling or varicosities noted, no clubbing or cyanosis Psych:  No mood changes, alert and cooperative,seems happy AA  is 0  Fall risk is low Depression screen [facility] 2/9 11/02/2020 09/09/2020 08/16/2020  Decreased Interest 0 0 0  Down, Depressed, Hopeless 0 0 0  PHQ - 2 Score 0 0 0  Altered sleeping 0 - -  Tired, decreased energy 0 - -  Change in appetite 0 - -  Feeling bad or failure about yourself  0 - -  Trouble concentrating 0 - -  Moving slowly or fidgety/restless 0 - -  Suicidal thoughts 0 - -  PHQ-9 Score 0 - -  Difficult doing work/chores - - -    GAD 7 : Generalized Anxiety Score 11/02/2020 10/31/2019  Nervous, Anxious, on Edge 0 0  Control/stop worrying 0 0  Worry too much - different things 0 0  Trouble relaxing 0 0  Restless 0 0  Easily annoyed or irritable 0 0  Afraid - awful might happen 0 0  Total GAD 7 Score 0 0  Anxiety Difficulty - Not difficult at all      Upstream - 11/02/20 1035       Pregnancy Intention Screening   Does the patient want to become pregnant in the next year? N/A    Does the patient's partner want to become pregnant in the next year? N/A    Would the patient like to discuss contraceptive options today? N/A      Contraception Wrap Up   Current Method Female Sterilization   hyst   End Method Female Sterilization   hyst   Contraception Counseling Provided No            Examination chaperoned  by Levy Pupa LPN   Impression and Plan: 1. Encounter for well woman exam with routine gynecological exam Physical in 1 year Labs with PCP  Mammogram yearly Colonoscopy per GI   2. Pelvic pain in female Return in about 3 weeks to talk with Dr Elonda Husky about BSO   3. Encounter for screening fecal occult blood testing   4. Enlarged thyroid Sees Dr Dorris Fetch

## 2020-11-04 ENCOUNTER — Other Ambulatory Visit: Payer: Self-pay

## 2020-11-04 ENCOUNTER — Ambulatory Visit: Payer: Medicaid Other | Admitting: Orthopaedic Surgery

## 2020-11-04 ENCOUNTER — Encounter: Payer: Self-pay | Admitting: Orthopaedic Surgery

## 2020-11-04 VITALS — BP 118/74 | HR 74 | Ht 63.0 in | Wt 146.0 lb

## 2020-11-04 DIAGNOSIS — M545 Low back pain, unspecified: Secondary | ICD-10-CM | POA: Diagnosis not present

## 2020-11-04 NOTE — Progress Notes (Signed)
My back is no better.  She continues to have lower back pain. She has taken her medicine and done her exercises and still has pain.  She has no spasm, no new trauma.  Spine/Pelvis examination:  Inspection:  Overall, sacoiliac joint benign and hips nontender; without crepitus or defects.   Thoracic spine inspection: Alignment normal without kyphosis present   Lumbar spine inspection:  Alignment  with normal lumbar lordosis, without scoliosis apparent.   Thoracic spine palpation:  without tenderness of spinal processes   Lumbar spine palpation: without tenderness of lumbar area; without tightness of lumbar muscles    Range of Motion:   Lumbar flexion, forward flexion is normal without pain or tenderness    Lumbar extension is full without pain or tenderness   Left lateral bend is normal without pain or tenderness   Right lateral bend is normal without pain or tenderness   Straight leg raising is normal  Strength & tone: normal   Stability overall normal stability  Encounter Diagnosis  Name Primary?   Lumbar spine pain Yes   I would like to get MRI as she has not improved, has had this problem for many months, has done exercises and taken her medicine with no help.  I am concerned about HNP.  Return in three weeks.  Call if any problem.  Precautions discussed.  Electronically Signed Sanjuana Kava, MD 7/28/202210:23 AM

## 2020-11-08 ENCOUNTER — Other Ambulatory Visit: Payer: Self-pay | Admitting: Gastroenterology

## 2020-11-09 NOTE — Telephone Encounter (Signed)
I am sending in 6 month supply at this time.

## 2020-11-09 NOTE — Telephone Encounter (Signed)
Patient hasn't been since since April of 2021. Recommend OV within the next 6 months for additional refills. Please let her know and arrange.

## 2020-11-10 ENCOUNTER — Encounter: Payer: Self-pay | Admitting: Internal Medicine

## 2020-11-15 ENCOUNTER — Telehealth: Payer: Self-pay

## 2020-11-15 NOTE — Telephone Encounter (Signed)
Faxed copy of PA approval to [employer].

## 2020-11-15 NOTE — Telephone Encounter (Signed)
error 

## 2020-11-15 NOTE — Telephone Encounter (Signed)
Pt approved for Dexlansoprazole 60 mg DR caps. PA Case [id number], approved 08/08//2022 and 11/15/2021. Dx used: K21.9 (gerd) and Dysphagia. Tried and failed Protonix and Zantac. Gave to Manuela Schwartz to send for scan to chart.

## 2020-11-25 ENCOUNTER — Ambulatory Visit: Payer: Medicaid Other | Admitting: Orthopaedic Surgery

## 2020-11-25 ENCOUNTER — Ambulatory Visit (HOSPITAL_COMMUNITY): Payer: Medicaid Other

## 2020-11-26 ENCOUNTER — Encounter (HOSPITAL_COMMUNITY)
Admission: RE | Admit: 2020-11-26 | Discharge: 2020-11-26 | Disposition: A | Discharge status: Home / Self Care | Payer: Medicaid Other | Source: Ambulatory Visit | Attending: Orthopaedic Surgery | Admitting: Orthopaedic Surgery

## 2020-11-26 ENCOUNTER — Ambulatory Visit (HOSPITAL_COMMUNITY): Payer: Medicaid Other

## 2020-11-26 ENCOUNTER — Other Ambulatory Visit: Payer: Self-pay

## 2020-11-26 DIAGNOSIS — M545 Low back pain, unspecified: Secondary | ICD-10-CM | POA: Insufficient documentation

## 2020-11-30 ENCOUNTER — Other Ambulatory Visit: Payer: Self-pay

## 2020-11-30 ENCOUNTER — Encounter: Payer: Self-pay | Admitting: Obstetrics & Gynecology

## 2020-11-30 ENCOUNTER — Ambulatory Visit: Payer: Medicaid Other | Admitting: Obstetrics & Gynecology

## 2020-11-30 VITALS — BP 139/81 | HR 88 | Ht 63.0 in | Wt 150.0 lb

## 2020-11-30 DIAGNOSIS — R102 Pelvic and perineal pain: Secondary | ICD-10-CM

## 2020-11-30 DIAGNOSIS — G8929 Other chronic pain: Secondary | ICD-10-CM

## 2020-11-30 NOTE — Progress Notes (Signed)
Follow up appointment for results  Chief Complaint  Patient presents with   wants to have ovaries removed    Blood pressure 139/81, pulse 88, height '5\' 3"'$  (1.6 m), weight 150 lb (68 kg), last menstrual period 07/04/2010.  DG Lumbar Spine Complete  Result Date: 09/21/2020 Clinical:  Long history of lower back pain, no trauma X-rays were done of the lumbar spine, five views. There is normal lumbar lordosis.  Alignment of the lumbar disc spaces is good.  No fracture is noted. Bone quality is good. Impression:  Negative lumbar spine. Electronically Signed Sanjuana Kava, MD 6/14/202211:08 AM   MR Brain W Wo Contrast  Addendum Date: 10/05/2020   ADDENDUM REPORT: 10/05/2020 12:35 ADDENDUM: Note that if evaluation of the intracranial arteries is desired, a head MRA would need to be ordered. Electronically Signed   By: Logan Bores M.D.   On: 10/05/2020 12:35   Result Date: 10/05/2020 CLINICAL DATA:  Coronary artery disease.  Fibromuscular dysplasia. EXAM: MRI HEAD WITHOUT AND WITH CONTRAST TECHNIQUE: Multiplanar, multiecho pulse sequences of the brain and surrounding structures were obtained without and with intravenous contrast. CONTRAST:  33m GADAVIST GADOBUTROL 1 MMOL/ML IV SOLN COMPARISON:  Head CT 08/02/2018 FINDINGS: Brain: There is no evidence of an acute infarct, intracranial hemorrhage, mass, midline shift, or extra-axial fluid collection. The ventricles and sulci are normal. There are approximately 30 small foci of T2 FLAIR hyperintensity scattered throughout the predominantly subcortical white matter of both cerebral hemispheres. The periventricular white matter is largely spared. Mild cerebellar tonsillar ectopia measures 4 mm, not reaching typical criteria used for Chiari I malformation. The anterior aspect of the pituitary gland is prominent in size, measuring 9 mm in height which is borderline enlarged for the patient's age and gender, and the floor of the sella is focally depressed  anteriorly in the midline. The pituitary infundibulum is at most minimally displaced rightward. There is no significant suprasellar extension of the gland. Vascular: Major intracranial vascular flow voids are preserved. Skull and upper cervical spine: Unremarkable bone marrow signal. Sinuses/Orbits: Paranasal sinuses and mastoid air cells are clear. Unremarkable orbits. Trace bilateral mastoid fluid. Other: None. IMPRESSION: 1. No acute intracranial abnormality. 2. Age advanced cerebral white matter T2 signal changes, nonspecific though may reflect mild chronic small vessel ischemia, migraines, or prior infection/inflammation. 3. Borderline enlargement of the pituitary gland with focal depression of the sellar floor. This is of indeterminate significance, however an underlying pituitary adenoma is not excluded. No further imaging evaluation or follow-up is necessary. Consider endocrine function tests and correlate for history of pituitary hypersecretion. This follows ACR consensus guidelines: Management of Incidental Pituitary Findings on CT, MRI and F18-FDG PET: A White Paper of the ACR Incidental Findings Committee. J Am Coll Radiol 2018; [id number] Electronically Signed: By: ALogan BoresM.D. On: 10/05/2020 12:16   MR Lumbar Spine Wo Contrast  Result Date: 11/28/2020 CLINICAL DATA:  Low back pain for over 6 weeks. No known injury. EXAM: MRI LUMBAR SPINE WITHOUT CONTRAST TECHNIQUE: Multiplanar, multisequence MR imaging of the lumbar spine was performed. No intravenous contrast was administered. COMPARISON:  None. FINDINGS: Segmentation:  Standard. Alignment:  Physiologic. Vertebrae: No acute fracture, evidence of discitis, or aggressive bone lesion. Conus medullaris and cauda equina: Conus extends to the L1 level. Conus and cauda equina appear normal. Paraspinal and other soft tissues: No acute paraspinal abnormality. Disc levels: Disc spaces: Disc desiccation at L4-5 and L5-S1. T11-12: Tiny left  paracentral/foraminal disc protrusion. No foraminal or central canal stenosis.  T12-L1: No significant disc bulge. No neural foraminal stenosis. No central canal stenosis. L1-L2: No significant disc bulge. No neural foraminal stenosis. No central canal stenosis. L2-L3: No significant disc bulge. No neural foraminal stenosis. No central canal stenosis. L3-L4: No significant disc bulge. No neural foraminal stenosis. No central canal stenosis. L4-L5: Mild broad-based disc bulge. No foraminal or central canal stenosis. L5-S1: No significant disc bulge. No neural foraminal stenosis. No central canal stenosis. IMPRESSION: 1. At T11-12 there is a tiny left paracentral/foraminal disc protrusion. 2. At L4-5 there is a mild broad-based disc bulge. 3. No acute osseous injury of the lumbar spine. Electronically Signed   By: Kathreen Devoid M.D.   On: 11/28/2020 11:02   MR ANGIO ABDOMEN W WO CONTRAST  Result Date: 10/05/2020 CLINICAL DATA:  Fibromuscular dysplasia EXAM: MRA ABDOMEN AND PELVIS WITH CONTRAST TECHNIQUE: Multiplanar, multiecho pulse sequences of the abdomen and pelvis were obtained with intravenous contrast. Angiographic images of abdomen and pelvis were obtained using MRA technique with intravenous contrast. CONTRAST:  69m GADAVIST GADOBUTROL 1 MMOL/ML IV SOLN COMPARISON:  None. FINDINGS: MRA ABDOMEN/PELVIS FINDINGS Aorta: Normal in caliber. No evidence of aneurysm or dissection. Mild areas of intimal irregularity consistent with the presence of atherosclerotic plaque. Celiac: Widely patent. No aneurysm, dissection or evidence of fibromuscular dysplasia. Conventional hepatic arterial branching pattern. SMA: Widely patent and unremarkable. Renal arteries: Single renal arteries bilaterally. Subtle beaded appearance in the mid aspect of the right renal artery. The ostium is widely patent without evidence of hemodynamically significant stenosis. Similarly, on the left there is focal beading in the mid aspect of the  renal artery and also within the inter lobar branches at the hilum consistent with changes of fibromuscular dysplasia. No evidence of dissection or aneurysm. IMA: Patent, unremarkable. Iliac arteries: Focal atherosclerotic plaque without significant stenosis, dissection or aneurysm. Small penetrating atherosclerotic ulcer along the proximal right common iliac artery. The external iliac arteries are relatively spared from disease. Suspect focal atherosclerotic stenoses of the proximal internal iliac arteries bilaterally. There is a somewhat beaded appearance more distally in the internal iliac arteries likely representing changes of fibromuscular dysplasia. Visualized femoral arteries: Mild atherosclerotic plaque in the common femoral artery on the left without significant stenosis. No aneurysm or dissection. Venous structures: No focal venous abnormality. Venous structures appear patent and unremarkable. MRI ABDOMEN/PELVIS FINDINGS Lower chest: No acute abnormality. Hepatobiliary: Normal hepatic contour and morphology. No discrete hepatic lesion. Pancreas: No focal signal abnormality or mass. Genitourinary: Unremarkable adrenal glands, kidneys, ureters and bladder. Bowel: No focal bowel wall thickening or evidence of obstruction. Reproductive: Surgical changes of prior hysterectomy. Bilateral ovarian cysts appear simple. No solid mass. Other: No evidence of ascites or abdominal wall hernia. IMPRESSION: 1. 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 2. No evidence of aneurysm or dissection. 3. Scattered atherosclerotic plaque in the aorta, iliac and femoral arteries. Aortic Atherosclerosis (ICD10-I70.0). 4. Small penetrating atherosclerotic ulcer in the right common iliac artery. 5. No acute abnormality within the abdomen or pelvis. Signed, HCriselda Peaches MD, REvansvilleVascular and Interventional  Radiology Specialists GRome [facility] HospitalRadiology Electronically Signed   By: HJacqulynn CadetM.D.   On: 10/05/2020 09:42   MR ANGIO PELVIS W WO CONTRAST  Result Date: 10/05/2020 CLINICAL DATA:  Fibromuscular dysplasia EXAM: MRA ABDOMEN AND PELVIS WITH CONTRAST TECHNIQUE: Multiplanar, multiecho pulse sequences of the abdomen and pelvis were obtained with intravenous contrast. Angiographic images  of abdomen and pelvis were obtained using MRA technique with intravenous contrast. CONTRAST:  16m GADAVIST GADOBUTROL 1 MMOL/ML IV SOLN COMPARISON:  None. FINDINGS: MRA ABDOMEN/PELVIS FINDINGS Aorta: Normal in caliber. No evidence of aneurysm or dissection. Mild areas of intimal irregularity consistent with the presence of atherosclerotic plaque. Celiac: Widely patent. No aneurysm, dissection or evidence of fibromuscular dysplasia. Conventional hepatic arterial branching pattern. SMA: Widely patent and unremarkable. Renal arteries: Single renal arteries bilaterally. Subtle beaded appearance in the mid aspect of the right renal artery. The ostium is widely patent without evidence of hemodynamically significant stenosis. Similarly, on the left there is focal beading in the mid aspect of the renal artery and also within the inter lobar branches at the hilum consistent with changes of fibromuscular dysplasia. No evidence of dissection or aneurysm. IMA: Patent, unremarkable. Iliac arteries: Focal atherosclerotic plaque without significant stenosis, dissection or aneurysm. Small penetrating atherosclerotic ulcer along the proximal right common iliac artery. The external iliac arteries are relatively spared from disease. Suspect focal atherosclerotic stenoses of the proximal internal iliac arteries bilaterally. There is a somewhat beaded appearance more distally in the internal iliac arteries likely representing changes of fibromuscular dysplasia. Visualized femoral arteries: Mild atherosclerotic plaque in the common femoral artery  on the left without significant stenosis. No aneurysm or dissection. Venous structures: No focal venous abnormality. Venous structures appear patent and unremarkable. MRI ABDOMEN/PELVIS FINDINGS Lower chest: No acute abnormality. Hepatobiliary: Normal hepatic contour and morphology. No discrete hepatic lesion. Pancreas: No focal signal abnormality or mass. Genitourinary: Unremarkable adrenal glands, kidneys, ureters and bladder. Bowel: No focal bowel wall thickening or evidence of obstruction. Reproductive: Surgical changes of prior hysterectomy. Bilateral ovarian cysts appear simple. No solid mass. Other: No evidence of ascites or abdominal wall hernia. IMPRESSION: 1. 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 2. No evidence of aneurysm or dissection. 3. Scattered atherosclerotic plaque in the aorta, iliac and femoral arteries. Aortic Atherosclerosis (ICD10-I70.0). 4. Small penetrating atherosclerotic ulcer in the right common iliac artery. 5. No acute abnormality within the abdomen or pelvis. Signed, HCriselda Peaches MD, RGrass ValleyVascular and Interventional Radiology Specialists GMur[facility] IncRadiology Electronically Signed   By: HJacqulynn CadetM.D.   On: 10/05/2020 09:42       MEDS ordered this encounter: No orders of the defined types were placed in this encounter.   Orders for this encounter: No orders of the defined types were placed in this encounter.   Impression: Bilateral pelvic pain, presumed ovarian, chronic probably due to adhesions  Plan: Laparoscopic bilateral oophorectomy 12/15/20  Follow Up: Return in about 27 days (around 12/27/2020) [place] MFord [place] Companyvisit[place] E[place]onda Hus[place]    All questions were answered.  Past Medical History:  Diagnosis Date   Asthma dx 06/04/07   ventolin, singulair   Blood transfusion without reported diagnosis    CHF (congestive  heart failure) (HLadd 03/19/2008   a. 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 normalized by repeat imaging and at 55-60% in 04/2019,   DCM (dilated cardiomyopathy) (HDinosaur dx 06/06/07   EF 10-15%   Degeneration of spine    DJD (degenerative joint disease), lumbar 11/16/2016   chronic lumbar pain   GERD (gastroesophageal reflux disease)    takes Nexium Daily   Goiter 09/09/2020   Hypertension    Seasonal allergies    takes zyrtec    Past Surgical History:  Procedure Laterality Date   ABDOMINAL HYSTERECTOMY     fibroids; partial- ovaries intact   CERVICAL SPINE SURGERY  2002   [facility], ruptured disc   CHOLECYSTECTOMY  2001   laparoscopic, [facility]   COLONOSCOPY WITH PROPOFOL N/A 07/12/2017   tubular adenoma (10 mm polyp in cecum), segmental biopsies negative. 5 year surveillance   ESOPHAGOGASTRODUODENOSCOPY (EGD) WITH ESOPHAGEAL DILATION N/A 09/05/2012   [id number] hiatal hernia s/p esophageal dilation with 43 [address].    ESOPHAGOGASTRODUODENOSCOPY (EGD) WITH PROPOFOL N/A 07/12/2017   normal esophagus s/p dilation, normal duodenal bulb and second portion of duodenum   MALONEY DILATION N/A 07/12/2017   Procedure: MALONEY DILATION;  Surgeon: [name], MD;  Location: [facility];  Service: Endoscopy;  Laterality: N/A;   POLYPECTOMY  07/12/2017   Procedure: POLYPECTOMY;  Surgeon: [name], MD;  Location: [facility];  Service: Endoscopy;;  Cecal polyp (HS)   SPINE SURGERY     TUBAL LIGATION     VAGINAL HYSTERECTOMY  10/19/2010   Procedure: HYSTERECTOMY VAGINAL;  Surgeon: [name], MD;  Location: [facility] ORS;  Service: Gynecology;  Laterality: N/A;    OB History     Gravida  3   Para  2   Term  2   Preterm      AB  1   Living  2      SAB  1   IAB      Ectopic      Multiple      Live Births  2           No Known Allergies  Social History   Socioeconomic History   Marital status: Single    Spouse name: Not on file   Number of  children: 2   Years of education: 16   Highest education level: Not on file  Occupational History   Occupation: [employer]    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  Tobacco Use   Smoking status: Former    Packs/day: 0.25    Years: 4.00    Pack years: 1.00    Types: Cigarettes    Quit date: 03/14/2009    Years since quitting: 11.7   Smokeless tobacco: Never  Vaping Use   Vaping Use: Never used  Substance and Sexual Activity   Alcohol use: No   Drug use: No   Sexual activity: Not Currently    Birth control/protection: Surgical    Comment: hyst  Other Topics Concern   Not on file  Social History Narrative   Actuary at [employer]; 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      Lives with 2 children- age 76 and 17; she homeschools them   Social Determinants of Health   Financial Resource Strain: High Risk   Difficulty of Paying Living Expenses: Hard  Food Insecurity: No Food Insecurity   Worried About Charity fundraiser in the Last Year: Never true   Ran Out of Food in the Last Year: Never true  Transportation Needs: No Transportation Needs   Lack of Transportation (Medical): No   Lack of Transportation (Non-Medical): No  Physical Activity: Insufficiently Active   Days of Exercise per Week: 1 day   Minutes of Exercise per Session: 20 min  Stress: No Stress Concern Present   Feeling of Stress : Not at all  Social Connections: Socially Isolated   Frequency of Communication with Friends and Family: Once a week   Frequency of Social Gatherings with Friends and  Family: Never   Attends Religious Services: More than 4 times per year   Active Member of Clubs or Organizations: No   Attends Music therapist: Never   Marital Status: Never married    Family History  Problem Relation Age of Onset   Diabetes Mother    Hypertension Mother    Congestive Heart Failure Father 3   Arthritis Father    Dementia Maternal Grandmother    Congestive Heart Failure Paternal  Grandmother    Congestive Heart Failure Paternal Grandfather    Cancer Maternal Uncle    Anesthesia problems Neg Hx    Hypotension Neg Hx    Pseudochol deficiency Neg Hx    Malignant hyperthermia Neg Hx    Colon cancer Neg Hx    Colon polyps Neg Hx

## 2020-12-03 ENCOUNTER — Encounter (HOSPITAL_COMMUNITY): Payer: Self-pay

## 2020-12-03 ENCOUNTER — Other Ambulatory Visit (HOSPITAL_COMMUNITY): Payer: Medicaid Other

## 2020-12-03 NOTE — Patient Instructions (Signed)
Destiny Manning  12/03/2020     '@PREFPERIOPPHARMACY'$ @   Your procedure is scheduled on  12/15/2020.   Report to [facility] at  [place] AM   Call this number if you have problems the morning of surgery:  [phone]   Remember:  Do not eat after midnight.   You may drink clear liquids until  0600 AM.       At 0600 12/15/2020, drink your carb drink. You can not have anything else to drink after this.     Take these medicines the morning of surgery with A SIP OF WATER         baclofen, carvedilol, zyrtec, dexilant, gabapentin, imitrex (if needed).      Do not wear jewelry, make-up or nail polish.  Do not wear lotions, powders, or perfumes, or deodorant.  Do not shave 48 hours prior to surgery.  Men may shave face and neck.  Do not bring valuables to the hospital.  [facility] is not responsible for any belongings or valuables.  Contacts, dentures or bridgework may not be worn into surgery.  Leave your suitcase in the car.  After surgery it may be brought to your room.  For patients admitted to the hospital, discharge time will be determined by your treatment team.  Patients discharged the day of surgery will not be allowed to drive home and must have someone with them for 24 hours.    Special instructions:   DO NOT smoke tobacco or vape for 24 hours before your procedure.  Please read over the following fact sheets that you were given. Coughing and Deep Breathing, Surgical Site Infection Prevention, Anesthesia Post-op Instructions, and Care and Recovery After Surgery      Bilateral Salpingo-Oophorectomy, Care After The following information offers guidance on how to care for yourself after your procedure. Your health care provider may also give you more specific instructions. If you have problems or questions, contact your health careprovider. What can I expect after the procedure? After the procedure, it is common to have: Pain in your abdomen. Occasional  vaginal bleeding (spotting). Tiredness. Symptoms of menopause, such as hot flashes, night sweats, and mood swings. Your recovery time will depend on which method was used for your surgery. Follow these instructions at home: Medicines Take over-the-counter and prescription medicines only as told by your health care provider. Ask your health care provider if the medicine prescribed to you: Requires you to avoid driving or using machinery. Can cause constipation. You may need to take these actions to prevent or treat constipation: Drink enough fluid to keep your urine pale yellow. Take over-the-counter or prescription medicines. Eat foods that are high in fiber, such as beans, whole grains, and fresh fruits and vegetables. Limit foods that are high in fat and processed sugars, such as fried or sweet foods. Incision care  Follow instructions from your health care provider about how to take care of your incision or incisions. Make sure you: Wash your hands with soap and water for at least 20 seconds before and after you change your bandage (dressing). If soap and water are not available, use hand sanitizer. Change your dressing as told by your health care provider. Keep your incision area and your dressing clean and dry. Leave stitches (sutures), staples, skin glue, or adhesive strips in place. These skin closures may need to stay in place for 2 weeks or longer. If adhesive strip edges start to loosen and  curl up, you may trim the loose edges. Do not remove adhesive strips completely unless your health care provider tells you to do that. Check your incision area every day for signs of infection. Check for: Redness, swelling, or pain. Fluid or blood. Warmth. Pus or a bad smell.  Activity  Rest as told by your health care provider. Avoid sitting for a long time without moving. Get up to take short walks every 1-2 hours. This is important to improve blood flow and breathing. Ask for help if you  feel weak or unsteady. Return to your normal activities as told by your health care provider. Ask your health care provider what activities are safe for you. Do not drive until your health care provider says that it is safe. Do not lift anything that is heavier than 10 lb (4.5 kg), or the limit that you are told, until your health care provider says that it is safe. Do not do any activity that requires great effort. Do not douche, use tampons, or have sex until your health care provider approves.  General instructions  Do not use any products that contain nicotine or tobacco. These products include cigarettes, chewing tobacco, and vaping devices, such as e-cigarettes. These can delay incision healing after surgery. If you need help quitting, ask your health care provider. Do not take baths, swim, or use a hot tub until your health care provider approves. Ask your health care provider if you may take showers. You may only be allowed to take sponge baths. Wear compression stockings as told by your health care provider. These stockings help to prevent blood clots and reduce swelling in your legs. Keep all follow-up visits. This is important.  Contact a health care provider if: You have pain when you urinate. You have pus, a bad smell, or a bad-smelling discharge coming from your vagina or from an incision. You have redness, swelling, or pain around an incision or an incision feels warm to the touch. You have fluid or blood coming from an incision or an incision starts to open. You have a fever or a rash. You have abdominal pain that gets worse or does not get better with medicine. You feel light-headed, have nausea and vomiting, or both. Get help right away if: You have pain in your chest or leg. You develop shortness of breath. You faint. You have increased or heavy vaginal bleeding, such as soaking a sanitary napkin in an hour. These symptoms may represent a serious problem that is an  emergency. Do not wait to see if the symptoms will go away. Get medical help right away. Call your local emergency services (911 in the [place] Do not drive yourself to the hospital. Summary After the procedure, it is common to have pain, bleeding in the vagina, tiredness, and symptoms of menopause. Follow instructions from your health care provider about how to take care of your incision or incisions. Follow instructions from your health care provider about activities and restrictions. Check your incision area every day for signs of infection. Report any signs to your health care provider. This information is not intended to replace advice given to you by your health care provider. Make sure you discuss any questions you have with your healthcare provider. Document Revised: 02/17/2020 Document Reviewed: 02/17/2020 Elsevier Patient Education  2022 [place] Anesthesia, Adult, Care After This sheet gives you information about how to care for yourself after your procedure. Your health care provider may also give you more specific  instructions. If you have problems or questions, contact your health careprovider. What can I expect after the procedure? After the procedure, the following side effects are common: Pain or discomfort at the IV site. Nausea. Vomiting. Sore throat. Trouble concentrating. Feeling cold or chills. Feeling weak or tired. Sleepiness and fatigue. Soreness and body aches. These side effects can affect parts of the body that were not involved in surgery. Follow these instructions at home: For the time period you were told by your health care provider:  Rest. Do not participate in activities where you could fall or become injured. Do not drive or use machinery. Do not drink alcohol. Do not take sleeping pills or medicines that cause drowsiness. Do not make important decisions or sign legal documents. Do not take care of children on your own.  Eating and  drinking Follow any instructions from your health care provider about eating or drinking restrictions. When you feel hungry, start by eating small amounts of foods that are soft and easy to digest (bland), such as toast. Gradually return to your regular diet. Drink enough fluid to keep your urine pale yellow. If you vomit, rehydrate by drinking water, juice, or clear broth. General instructions If you have sleep apnea, surgery and certain medicines can increase your risk for breathing problems. Follow instructions from your health care provider about wearing your sleep device: Anytime you are sleeping, including during daytime naps. While taking prescription pain medicines, sleeping medicines, or medicines that make you drowsy. Have a responsible adult stay with you for the time you are told. It is important to have someone help care for you until you are awake and alert. Return to your normal activities as told by your health care provider. Ask your health care provider what activities are safe for you. Take over-the-counter and prescription medicines only as told by your health care provider. If you smoke, do not smoke without supervision. Keep all follow-up visits as told by your health care provider. This is important. Contact a health care provider if: You have nausea or vomiting that does not get better with medicine. You cannot eat or drink without vomiting. You have pain that does not get better with medicine. You are unable to pass urine. You develop a skin rash. You have a fever. You have redness around your IV site that gets worse. Get help right away if: You have difficulty breathing. You have chest pain. You have blood in your urine or stool, or you vomit blood. Summary After the procedure, it is common to have a sore throat or nausea. It is also common to feel tired. Have a responsible adult stay with you for the time you are told. It is important to have someone help care  for you until you are awake and alert. When you feel hungry, start by eating small amounts of foods that are soft and easy to digest (bland), such as toast. Gradually return to your regular diet. Drink enough fluid to keep your urine pale yellow. Return to your normal activities as told by your health care provider. Ask your health care provider what activities are safe for you. This information is not intended to replace advice given to you by your health care provider. Make sure you discuss any questions you have with your healthcare provider. Document Revised: 12/11/2019 Document Reviewed: 07/10/2019 Elsevier Patient Education  2022 [place]. How to Use Chlorhexidine for Bathing Chlorhexidine gluconate (CHG) is a germ-killing (antiseptic) solution that is used to clean the  skin. It can get rid of the bacteria that normally live on the skin and can keep them away for about 24 hours. To clean your skin with CHG, you may be given: A CHG solution to use in the shower or as part of a sponge bath. A prepackaged cloth that contains CHG. Cleaning your skin with CHG may help lower the risk for infection: While you are staying in the intensive care unit of the hospital. If you have a vascular access, such as a central line, to provide short-term or long-term access to your veins. If you have a catheter to drain urine from your bladder. If you are on a ventilator. A ventilator is a machine that helps you breathe by moving air in and out of your lungs. After surgery. What are the risks? Risks of using CHG include: A skin reaction. Hearing loss, if CHG gets in your ears. Eye injury, if CHG gets in your eyes and is not rinsed out. The CHG product catching fire. Make sure that you avoid smoking and flames after applying CHG to your skin. Do not use CHG: If you have a chlorhexidine allergy or have previously reacted to chlorhexidine. On babies younger than 39 months of age. How to use CHG  solution Use CHG only as told by your health care provider, and follow the instructions on the label. Use the full amount of CHG as directed. Usually, this is one bottle. During a shower Follow these steps when using CHG solution during a shower (unless your health care provider gives you different instructions): Start the shower. Use your normal soap and shampoo to wash your face and hair. Turn off the shower or move out of the shower stream. Pour the CHG onto a clean washcloth. Do not use any type of brush or rough-edged sponge. Starting at your neck, lather your body down to your toes. Make sure you follow these instructions: If you will be having surgery, pay special attention to the part of your body where you will be having surgery. Scrub this area for at least 1 minute. Do not use CHG on your head or face. If the solution gets into your ears or eyes, rinse them well with water. Avoid your genital area. Avoid any areas of skin that have broken skin, cuts, or scrapes. Scrub your back and under your arms. Make sure to wash skin folds. Let the lather sit on your skin for 1-2 minutes or as long as told by your health care provider. Thoroughly rinse your entire body in the shower. Make sure that all body creases and crevices are rinsed well. Dry off with a clean towel. Do not put any substances on your body afterward--such as powder, lotion, or perfume--unless you are told to do so by your health care provider. Only use lotions that are recommended by the manufacturer. Put on clean clothes or pajamas. If it is the night before your surgery, sleep in clean sheets.  During a sponge bath Follow these steps when using CHG solution during a sponge bath (unless your health care provider gives you different instructions): Use your normal soap and shampoo to wash your face and hair. Pour the CHG onto a clean washcloth. Starting at your neck, lather your body down to your toes. Make sure you follow  these instructions: If you will be having surgery, pay special attention to the part of your body where you will be having surgery. Scrub this area for at least 1 minute. Do not  use CHG on your head or face. If the solution gets into your ears or eyes, rinse them well with water. Avoid your genital area. Avoid any areas of skin that have broken skin, cuts, or scrapes. Scrub your back and under your arms. Make sure to wash skin folds. Let the lather sit on your skin for 1-2 minutes or as long as told by your health care provider. Using a different clean, wet washcloth, thoroughly rinse your entire body. Make sure that all body creases and crevices are rinsed well. Dry off with a clean towel. Do not put any substances on your body afterward--such as powder, lotion, or perfume--unless you are told to do so by your health care provider. Only use lotions that are recommended by the manufacturer. Put on clean clothes or pajamas. If it is the night before your surgery, sleep in clean sheets. How to use CHG prepackaged cloths Only use CHG cloths as told by your health care provider, and follow the instructions on the label. Use the CHG cloth on clean, dry skin. Do not use the CHG cloth on your head or face unless your health care provider tells you to. When washing with the CHG cloth: Avoid your genital area. Avoid any areas of skin that have broken skin, cuts, or scrapes. Before surgery Follow these steps when using a CHG cloth to clean before surgery (unless your health care provider gives you different instructions): Using the CHG cloth, vigorously scrub the part of your body where you will be having surgery. Scrub using a back-and-forth motion for 3 minutes. The area on your body should be completely wet with CHG when you are done scrubbing. Do not rinse. Discard the cloth and let the area air-dry. Do not put any substances on the area afterward, such as powder, lotion, or perfume. Put on clean  clothes or pajamas. If it is the night before your surgery, sleep in clean sheets.  For general bathing Follow these steps when using CHG cloths for general bathing (unless your health care provider gives you different instructions). Use a separate CHG cloth for each area of your body. Make sure you wash between any folds of skin and between your fingers and toes. Wash your body in the following order, switching to a new cloth after each step: The front of your neck, shoulders, and chest. Both of your arms, under your arms, and your hands. Your stomach and groin area, avoiding the genitals. Your right leg and foot. Your left leg and foot. The back of your neck, your back, and your buttocks. Do not rinse. Discard the cloth and let the area air-dry. Do not put any substances on your body afterward--such as powder, lotion, or perfume--unless you are told to do so by your health care provider. Only use lotions that are recommended by the manufacturer. Put on clean clothes or pajamas. Contact a health care provider if: Your skin gets irritated after scrubbing. You have questions about using your solution or cloth. Get help right away if: Your eyes become very red or swollen. Your eyes itch badly. Your skin itches badly and is red or swollen. Your hearing changes. You have trouble seeing. You have swelling or tingling in your mouth or throat. You have trouble breathing. You swallow any chlorhexidine. Summary Chlorhexidine gluconate (CHG) is a germ-killing (antiseptic) solution that is used to clean the skin. Cleaning your skin with CHG may help to lower your risk for infection. You may be given CHG to use  for bathing. It may be in a bottle or in a prepackaged cloth to use on your skin. Carefully follow your health care provider's instructions and the instructions on the product label. Do not use CHG if you have a chlorhexidine allergy. Contact your health care provider if your skin gets  irritated after scrubbing. This information is not intended to replace advice given to you by your health care provider. Make sure you discuss any questions you have with your healthcare provider. Document Revised: 08/08/2019 Document Reviewed: 09/12/2019 Elsevier Patient Education  [place].

## 2020-12-07 ENCOUNTER — Other Ambulatory Visit: Payer: Self-pay

## 2020-12-07 ENCOUNTER — Ambulatory Visit (INDEPENDENT_AMBULATORY_CARE_PROVIDER_SITE_OTHER): Payer: Medicaid Other | Admitting: Orthopaedic Surgery

## 2020-12-07 ENCOUNTER — Encounter: Payer: Self-pay | Admitting: Orthopaedic Surgery

## 2020-12-07 DIAGNOSIS — M545 Low back pain, unspecified: Secondary | ICD-10-CM

## 2020-12-07 NOTE — Progress Notes (Signed)
My back still hurts a lot.  She had the MRI of the lumbar spine and it showed: IMPRESSION: 1. At T11-12 there is a tiny left paracentral/foraminal disc protrusion. 2. At L4-5 there is a mild broad-based disc bulge. 3. No acute osseous injury of the lumbar spine.   I have explained the findings to her.  I have independently reviewed the MRI.    Spine/Pelvis examination:  Inspection:  Overall, sacoiliac joint benign and hips nontender; without crepitus or defects.   Thoracic spine inspection: Alignment normal without kyphosis present   Lumbar spine inspection:  Alignment  with normal lumbar lordosis, without scoliosis apparent.   Thoracic spine palpation:  without tenderness of spinal processes   Lumbar spine palpation: without tenderness of lumbar area; without tightness of lumbar muscles    Range of Motion:   Lumbar flexion, forward flexion is normal without pain or tenderness    Lumbar extension is full without pain or tenderness   Left lateral bend is normal without pain or tenderness   Right lateral bend is normal without pain or tenderness   Straight leg raising is normal  Strength & tone: normal   Stability overall normal stability  Encounter Diagnosis  Name Primary?   Lumbar spine pain Yes   She requests to be seen as needed.  Call if any problem.  Precautions discussed.  Electronically Signed Sanjuana Kava, MD 8/30/20223:00 PM

## 2020-12-09 ENCOUNTER — Encounter (HOSPITAL_COMMUNITY)
Admission: RE | Admit: 2020-12-09 | Discharge: 2020-12-09 | Disposition: A | Discharge status: Home / Self Care | Payer: Medicaid Other | Source: Ambulatory Visit | Attending: Obstetrics & Gynecology | Admitting: Obstetrics & Gynecology

## 2020-12-09 ENCOUNTER — Other Ambulatory Visit: Payer: Self-pay

## 2020-12-09 ENCOUNTER — Other Ambulatory Visit: Payer: Self-pay | Admitting: Obstetrics & Gynecology

## 2020-12-09 DIAGNOSIS — Z01812 Encounter for preprocedural laboratory examination: Secondary | ICD-10-CM | POA: Insufficient documentation

## 2020-12-09 LAB — RAPID HIV SCREEN (HIV 1/2 AB+AG)
HIV 1/2 Antibodies: NONREACTIVE
HIV-1 P24 Antigen - HIV24: NONREACTIVE

## 2020-12-09 LAB — URINALYSIS, ROUTINE W REFLEX MICROSCOPIC
Bacteria, UA: NONE SEEN
Bilirubin Urine: NEGATIVE
Glucose, UA: NEGATIVE mg/dL
Ketones, ur: NEGATIVE mg/dL
Leukocytes,Ua: NEGATIVE
Nitrite: NEGATIVE
Protein, ur: NEGATIVE mg/dL
Specific Gravity, Urine: 1.012 (ref 1.005–1.030)
pH: 6 (ref 5.0–8.0)

## 2020-12-09 LAB — COMPREHENSIVE METABOLIC PANEL
ALT: 12 U/L (ref 0–44)
AST: 18 U/L (ref 15–41)
Albumin: 3.9 g/dL (ref 3.5–5.0)
Alkaline Phosphatase: 58 U/L (ref 38–126)
Anion gap: 8 (ref 5–15)
BUN: 12 mg/dL (ref 6–20)
CO2: 28 mmol/L (ref 22–32)
Calcium: 8.9 mg/dL (ref 8.9–10.3)
Chloride: 100 mmol/L (ref 98–111)
Creatinine, Ser: 0.82 mg/dL (ref 0.44–1.00)
GFR, Estimated: >60 mL/min (ref >60)
Glucose, Bld: 95 mg/dL (ref 70–99)
Potassium: 2.8 mmol/L — ABNORMAL LOW (ref 3.5–5.1)
Sodium: 136 mmol/L (ref 135–145)
Total Bilirubin: 0.5 mg/dL (ref 0.3–1.2)
Total Protein: 7.3 g/dL (ref 6.5–8.1)

## 2020-12-09 LAB — CBC
HCT: 40.1 % (ref 36.0–46.0)
Hemoglobin: 14.2 g/dL (ref 12.0–15.0)
MCH: 31.7 pg (ref 26.0–34.0)
MCHC: 35.4 g/dL (ref 30.0–36.0)
MCV: 89.5 fL (ref 80.0–100.0)
Platelets: 253 10*3/uL (ref 150–400)
RBC: 4.48 MIL/uL (ref 3.87–5.11)
RDW: 14.2 % (ref 11.5–15.5)
WBC: 7.5 10*3/uL (ref 4.0–10.5)
nRBC: 0 % (ref 0.0–0.2)

## 2020-12-09 LAB — TYPE AND SCREEN
ABO/RH(D): B POS
Antibody Screen: NEGATIVE

## 2020-12-10 ENCOUNTER — Other Ambulatory Visit: Payer: Self-pay | Admitting: Obstetrics & Gynecology

## 2020-12-10 MED ORDER — POTASSIUM CHLORIDE CRYS ER 20 MEQ PO TBCR: 20.0000 meq | EXTENDED_RELEASE_TABLET | Freq: Three times a day (TID) | ORAL | 0 refills | Status: DC

## 2020-12-15 ENCOUNTER — Ambulatory Visit (HOSPITAL_COMMUNITY): Payer: Medicaid Other | Admitting: Anesthesiology

## 2020-12-15 ENCOUNTER — Ambulatory Visit (HOSPITAL_COMMUNITY)
Admission: RE | Admit: 2020-12-15 | Discharge: 2020-12-15 | Disposition: A | Discharge status: Home / Self Care | Payer: Medicaid Other | Attending: Obstetrics & Gynecology | Admitting: Obstetrics & Gynecology

## 2020-12-15 ENCOUNTER — Encounter (HOSPITAL_COMMUNITY): Payer: Self-pay | Admitting: Obstetrics & Gynecology

## 2020-12-15 ENCOUNTER — Encounter (HOSPITAL_COMMUNITY)
Admission: RE | Disposition: A | Discharge status: Home / Self Care | Payer: Self-pay | Source: Home / Self Care | Attending: Obstetrics & Gynecology

## 2020-12-15 DIAGNOSIS — I110 Hypertensive heart disease with heart failure: Secondary | ICD-10-CM | POA: Insufficient documentation

## 2020-12-15 DIAGNOSIS — G8929 Other chronic pain: Secondary | ICD-10-CM

## 2020-12-15 DIAGNOSIS — Z9049 Acquired absence of other specified parts of digestive tract: Secondary | ICD-10-CM | POA: Diagnosis not present

## 2020-12-15 DIAGNOSIS — D270 Benign neoplasm of right ovary: Secondary | ICD-10-CM | POA: Insufficient documentation

## 2020-12-15 DIAGNOSIS — I509 Heart failure, unspecified: Secondary | ICD-10-CM | POA: Insufficient documentation

## 2020-12-15 DIAGNOSIS — Z86010 Personal history of colonic polyps: Secondary | ICD-10-CM | POA: Diagnosis not present

## 2020-12-15 DIAGNOSIS — R102 Pelvic and perineal pain: Secondary | ICD-10-CM

## 2020-12-15 DIAGNOSIS — Z90711 Acquired absence of uterus with remaining cervical stump: Secondary | ICD-10-CM | POA: Diagnosis not present

## 2020-12-15 DIAGNOSIS — I420 Dilated cardiomyopathy: Secondary | ICD-10-CM | POA: Insufficient documentation

## 2020-12-15 DIAGNOSIS — Z8249 Family history of ischemic heart disease and other diseases of the circulatory system: Secondary | ICD-10-CM | POA: Insufficient documentation

## 2020-12-15 DIAGNOSIS — N8302 Follicular cyst of left ovary: Secondary | ICD-10-CM | POA: Diagnosis not present

## 2020-12-15 DIAGNOSIS — Z87891 Personal history of nicotine dependence: Secondary | ICD-10-CM | POA: Diagnosis not present

## 2020-12-15 DIAGNOSIS — K219 Gastro-esophageal reflux disease without esophagitis: Secondary | ICD-10-CM | POA: Diagnosis not present

## 2020-12-15 HISTORY — PX: LAPAROSCOPIC BILATERAL SALPINGO OOPHERECTOMY: SHX5890

## 2020-12-15 LAB — POCT I-STAT, CHEM 8
BUN: 11 mg/dL (ref 6–20)
Calcium, Ion: 1.12 mmol/L — ABNORMAL LOW (ref 1.15–1.40)
Chloride: 99 mmol/L (ref 98–111)
Creatinine, Ser: 0.8 mg/dL (ref 0.44–1.00)
Glucose, Bld: 99 mg/dL (ref 70–99)
HCT: 41 % (ref 36.0–46.0)
Hemoglobin: 13.9 g/dL (ref 12.0–15.0)
Potassium: 3.6 mmol/L (ref 3.5–5.1)
Sodium: 140 mmol/L (ref 135–145)
TCO2: 31 mmol/L (ref 22–32)

## 2020-12-15 SURGERY — SALPINGO-OOPHORECTOMY, BILATERAL, LAPAROSCOPIC
Anesthesia: General | Site: Abdomen | Laterality: Bilateral

## 2020-12-15 MED ORDER — PHENYLEPHRINE HCL (PRESSORS) 10 MG/ML IV SOLN
INTRAVENOUS | Status: DC | PRN
  Administered 2020-12-15: 120 ug via INTRAVENOUS
  Administered 2020-12-15: 80 ug via INTRAVENOUS

## 2020-12-15 MED ORDER — SODIUM CHLORIDE 0.9 % IR SOLN
Status: DC | PRN
  Administered 2020-12-15: 1000 mL

## 2020-12-15 MED ORDER — LIDOCAINE HCL (PF) 2 % IJ SOLN
INTRAMUSCULAR | Status: AC
  Filled 2020-12-15: qty 5

## 2020-12-15 MED ORDER — HYDROCODONE-ACETAMINOPHEN 5-325 MG PO TABS: 1.0000 | ORAL_TABLET | Freq: Four times a day (QID) | ORAL | 0 refills | Status: DC | PRN

## 2020-12-15 MED ORDER — BUPIVACAINE LIPOSOME 1.3 % IJ SUSP
INTRAMUSCULAR | Status: DC | PRN
  Administered 2020-12-15: 20 mL

## 2020-12-15 MED ORDER — LIDOCAINE HCL (CARDIAC) PF 100 MG/5ML IV SOSY
PREFILLED_SYRINGE | INTRAVENOUS | Status: DC | PRN
  Administered 2020-12-15: 30 mg via INTRAVENOUS

## 2020-12-15 MED ORDER — FENTANYL CITRATE (PF) 100 MCG/2ML IJ SOLN
INTRAMUSCULAR | Status: AC
  Filled 2020-12-15: qty 2

## 2020-12-15 MED ORDER — MIDAZOLAM HCL 5 MG/5ML IJ SOLN
INTRAMUSCULAR | Status: DC | PRN
Start: 2020-12-15 — End: 2020-12-15
  Administered 2020-12-15: 2 mg via INTRAVENOUS

## 2020-12-15 MED ORDER — ROCURONIUM BROMIDE 10 MG/ML (PF) SYRINGE
PREFILLED_SYRINGE | INTRAVENOUS | Status: AC
  Filled 2020-12-15: qty 20

## 2020-12-15 MED ORDER — ONDANSETRON HCL 4 MG/2ML IJ SOLN: 4.0000 mg | Freq: Once | INTRAMUSCULAR | Status: DC | PRN

## 2020-12-15 MED ORDER — KETOROLAC TROMETHAMINE 30 MG/ML IJ SOLN
INTRAMUSCULAR | Status: AC
  Filled 2020-12-15: qty 1

## 2020-12-15 MED ORDER — BUPIVACAINE LIPOSOME 1.3 % IJ SUSP
INTRAMUSCULAR | Status: AC
  Filled 2020-12-15: qty 20

## 2020-12-15 MED ORDER — ONDANSETRON HCL 4 MG/2ML IJ SOLN
INTRAMUSCULAR | Status: AC
  Filled 2020-12-15: qty 2

## 2020-12-15 MED ORDER — KETOROLAC TROMETHAMINE 30 MG/ML IJ SOLN
30.0000 mg | Freq: Once | INTRAMUSCULAR | Status: AC
  Administered 2020-12-15: 30 mg via INTRAVENOUS

## 2020-12-15 MED ORDER — BUPIVACAINE LIPOSOME 1.3 % IJ SUSP
20.0000 mL | Freq: Once | INTRAMUSCULAR | Status: DC
  Filled 2020-12-15: qty 20

## 2020-12-15 MED ORDER — ROCURONIUM BROMIDE 100 MG/10ML IV SOLN
INTRAVENOUS | Status: DC | PRN
  Administered 2020-12-15: 50 mg via INTRAVENOUS

## 2020-12-15 MED ORDER — PHENYLEPHRINE 40 MCG/ML (10ML) SYRINGE FOR IV PUSH (FOR BLOOD PRESSURE SUPPORT)
PREFILLED_SYRINGE | INTRAVENOUS | Status: AC
  Filled 2020-12-15: qty 10

## 2020-12-15 MED ORDER — DEXAMETHASONE SODIUM PHOSPHATE 10 MG/ML IJ SOLN
INTRAMUSCULAR | Status: AC
  Filled 2020-12-15: qty 1

## 2020-12-15 MED ORDER — CEFAZOLIN SODIUM-DEXTROSE 2-4 GM/100ML-% IV SOLN
INTRAVENOUS | Status: AC
  Filled 2020-12-15: qty 100

## 2020-12-15 MED ORDER — FENTANYL CITRATE PF 50 MCG/ML IJ SOSY
25.0000 ug | PREFILLED_SYRINGE | INTRAMUSCULAR | Status: DC | PRN
  Administered 2020-12-15 (×2): 50 ug via INTRAVENOUS
  Filled 2020-12-15 (×2): qty 1

## 2020-12-15 MED ORDER — MIDAZOLAM HCL 2 MG/2ML IJ SOLN
INTRAMUSCULAR | Status: AC
  Filled 2020-12-15: qty 2

## 2020-12-15 MED ORDER — FENTANYL CITRATE (PF) 100 MCG/2ML IJ SOLN
INTRAMUSCULAR | Status: DC | PRN
  Administered 2020-12-15: 50 ug via INTRAVENOUS
  Administered 2020-12-15: 100 ug via INTRAVENOUS

## 2020-12-15 MED ORDER — DEXAMETHASONE SODIUM PHOSPHATE 10 MG/ML IJ SOLN
INTRAMUSCULAR | Status: DC | PRN
  Administered 2020-12-15: 10 mg via INTRAVENOUS

## 2020-12-15 MED ORDER — KETOROLAC TROMETHAMINE 10 MG PO TABS: 10.0000 mg | ORAL_TABLET | Freq: Three times a day (TID) | ORAL | 0 refills | Status: DC | PRN

## 2020-12-15 MED ORDER — CEFAZOLIN SODIUM-DEXTROSE 2-4 GM/100ML-% IV SOLN
2.0000 g | INTRAVENOUS | Status: AC
  Administered 2020-12-15: 2 g via INTRAVENOUS

## 2020-12-15 MED ORDER — CHLORHEXIDINE GLUCONATE 0.12 % MT SOLN
OROMUCOSAL | Status: AC
  Administered 2020-12-15: 15 mL
  Filled 2020-12-15: qty 15

## 2020-12-15 MED ORDER — PROPOFOL 10 MG/ML IV BOLUS
INTRAVENOUS | Status: DC | PRN
Start: 2020-12-15 — End: 2020-12-15
  Administered 2020-12-15: 150 mg via INTRAVENOUS

## 2020-12-15 MED ORDER — ONDANSETRON HCL 4 MG/2ML IJ SOLN
INTRAMUSCULAR | Status: DC | PRN
  Administered 2020-12-15: 4 mg via INTRAVENOUS

## 2020-12-15 MED ORDER — CHLORHEXIDINE GLUCONATE 0.12 % MT SOLN
15.0000 mL | Freq: Once | OROMUCOSAL | Status: AC
  Administered 2020-12-15: 15 mL via OROMUCOSAL

## 2020-12-15 MED ORDER — POVIDONE-IODINE 10 % EX SWAB: 2.0000 "application " | Freq: Once | CUTANEOUS | Status: DC

## 2020-12-15 MED ORDER — PROPOFOL 10 MG/ML IV BOLUS
INTRAVENOUS | Status: AC
  Filled 2020-12-15: qty 20

## 2020-12-15 MED ORDER — ONDANSETRON 8 MG PO TBDP: 8.0000 mg | ORAL_TABLET | Freq: Three times a day (TID) | ORAL | 0 refills | Status: DC | PRN

## 2020-12-15 MED ORDER — SUGAMMADEX SODIUM 200 MG/2ML IV SOLN
INTRAVENOUS | Status: DC | PRN
  Administered 2020-12-15: 150 mg via INTRAVENOUS

## 2020-12-15 MED ORDER — LACTATED RINGERS IV SOLN: INTRAVENOUS | Status: DC

## 2020-12-15 MED ORDER — ORAL CARE MOUTH RINSE: 15.0000 mL | Freq: Once | OROMUCOSAL | Status: AC

## 2020-12-15 SURGICAL SUPPLY — 43 items
BAG HAMPER (MISCELLANEOUS) ×2 IMPLANT
BAG RETRIEVAL 10 (BASKET) ×1
BLADE SURG SZ11 CARB STEEL (BLADE) ×2 IMPLANT
CLOTH BEACON ORANGE TIMEOUT ST (SAFETY) ×2 IMPLANT
COVER LIGHT HANDLE STERIS (MISCELLANEOUS) ×4 IMPLANT
DERMABOND ADVANCED (GAUZE/BANDAGES/DRESSINGS) ×1
DERMABOND ADVANCED .7 DNX12 (GAUZE/BANDAGES/DRESSINGS) ×1 IMPLANT
ELECT REM PT RETURN 9FT ADLT (ELECTROSURGICAL) ×2
ELECTRODE REM PT RTRN 9FT ADLT (ELECTROSURGICAL) ×1 IMPLANT
FILTER SMOKE EVAC LAPAROSHD (FILTER) ×2 IMPLANT
GAUZE 4X4 16PLY ~~LOC~~+RFID DBL (SPONGE) ×2 IMPLANT
GLOVE ECLIPSE 8.0 STRL XLNG CF (GLOVE) ×2 IMPLANT
GLOVE SRG 8 PF TXTR STRL LF DI (GLOVE) ×1 IMPLANT
GLOVE SURG UNDER POLY LF SZ7 (GLOVE) ×6 IMPLANT
GLOVE SURG UNDER POLY LF SZ8 (GLOVE) ×2
GOWN STRL REUS W/TWL LRG LVL3 (GOWN DISPOSABLE) ×2 IMPLANT
GOWN STRL REUS W/TWL XL LVL3 (GOWN DISPOSABLE) ×2 IMPLANT
INST SET LAPROSCOPIC GYN AP (KITS) ×2 IMPLANT
IV NS IRRIG 3000ML ARTHROMATIC (IV SOLUTION) IMPLANT
KIT TURNOVER KIT A (KITS) ×2 IMPLANT
MANIFOLD NEPTUNE II (INSTRUMENTS) ×2 IMPLANT
NEEDLE HYPO 18GX1.5 BLUNT FILL (NEEDLE) ×2 IMPLANT
NEEDLE HYPO 22GX1.5 SAFETY (NEEDLE) ×2 IMPLANT
NEEDLE INSUFFLATION 120MM (ENDOMECHANICALS) ×2 IMPLANT
PACK PERI GYN (CUSTOM PROCEDURE TRAY) ×2 IMPLANT
PAD ARMBOARD 7.5X6 YLW CONV (MISCELLANEOUS) ×2 IMPLANT
SET BASIN LINEN APH (SET/KITS/TRAYS/PACK) ×2 IMPLANT
SET TUBE IRRIG SUCTION NO TIP (IRRIGATION / IRRIGATOR) ×2 IMPLANT
SET TUBE SMOKE EVAC HIGH FLOW (TUBING) ×2 IMPLANT
SHEARS HARMONIC ACE PLUS 36CM (ENDOMECHANICALS) ×2 IMPLANT
SLEEVE ENDOPATH XCEL 5M (ENDOMECHANICALS) ×2 IMPLANT
SOL ANTI FOG 6CC (MISCELLANEOUS) ×1 IMPLANT
SOLUTION ANTI FOG 6CC (MISCELLANEOUS) ×1
SPONGE GAUZE 2X2 8PLY STRL LF (GAUZE/BANDAGES/DRESSINGS) ×6 IMPLANT
SUT VICRYL 0 UR6 27IN ABS (SUTURE) ×2 IMPLANT
SUT VICRYL AB 3-0 FS1 BRD 27IN (SUTURE) ×2 IMPLANT
SYR 10ML LL (SYRINGE) ×2 IMPLANT
SYR 20ML LL LF (SYRINGE) ×4 IMPLANT
SYS BAG RETRIEVAL 10MM (BASKET) ×1
SYSTEM BAG RETRIEVAL 10MM (BASKET) ×1 IMPLANT
TROCAR ENDO BLADELESS 11MM (ENDOMECHANICALS) ×2 IMPLANT
TROCAR XCEL NON-BLD 5MMX100MML (ENDOMECHANICALS) ×2 IMPLANT
WARMER LAPAROSCOPE (MISCELLANEOUS) ×2 IMPLANT

## 2020-12-15 NOTE — Anesthesia Postprocedure Evaluation (Signed)
Anesthesia Post Note  Patient: Destiny Manning  Procedure(s) Performed: LAPAROSCOPIC BILATERAL SALPINGO OOPHORECTOMY (Bilateral: Abdomen)  Anesthesia Type: General Anesthetic complications: no   No notable events documented.   Last Vitals:  Vitals:   12/15/20 1215 12/15/20 1225  BP: 124/69 135/69  Pulse: 76 75  Resp: 13 16  Temp:  36.4 C  SpO2: 100% 99%    Last Pain:  Vitals:   12/15/20 1225  TempSrc: Oral  PainSc: [place]

## 2020-12-15 NOTE — Op Note (Signed)
Preoperative diagnosis:  1.  Pelvic pain, chronic, bilatera                                         2.  Suspect ovarian adhesions as potential source  Postoperative diagnosis:  Same as above  Procedure:  Laparoscopic bilateral salpingo-oophorectomy  Surgeon:  Jacelyn Grip MD  Anesthesia:  Gen. Endotracheal  Findings:  No adhesions of ovaries noted No endometriosis No definitive source of pain is identified  Description of operation:  The patient was taken to the operating room and placed in the supine position where she underwent general endotracheal anesthesia.  She was placed in the low lithotomy position.  She was prepped and draped in the usual sterile fashion and a Foley catheter was placed.  An incision was made in the umbilicus and a Veres needle was placed into the peritoneal cavity with one pass without difficulty.  The peritoneal cavity was then insufflated.  An 11 mm non-bladed direct visualization trocar was then used and placed into the peritoneal cavity with direct laparoscopic visualization again without difficulty.  Incisions were then made in the left lower quadrant and right lower quadrant.  A 5 mm trocar was placed in the left lower quadrant and a 5 mm trocar was placed in the right lower quadrant.  Both were done under direct visualization without difficulty.  The right ovary was grasped.  The harmonic scalpel was used and the infundibulopelvic ligament on the right was coagulated and then transected.  He remaining peritoneal attachments of the right ovary were also taken down hemostatically.  The left ovary was identified and again the infundibulopelvic ligament on the left was coagulated and transected.  T An Endo Catch was placed and both ovaries removed.    The umbilical fascia was closed with a single 0 Vicryl suture.  The subcutaneous tissue was reapproximated loosely using 0 Vicryl.  All 3 incisions were closed in a subcuticular fashion   The patient was awakened  from anesthesia and taken to the recovery room in good stable condition with all counts being correct x3.  The estimated blood loss for the procedure is minimal There was no bleeding from the intraperitoneal surgery at all.  The patient received ancef and toradol preoperatively She will be discharged from the recovery room time and seen next week for staple and suture removal.  [name], MD 12/15/2020 11:40 AM

## 2020-12-15 NOTE — Anesthesia Preprocedure Evaluation (Signed)
Anesthesia Evaluation  Patient identified by MRN, date of birth, ID band Patient awake    Reviewed: Allergy & Precautions, H&P , NPO status , Patient's Chart, lab work & pertinent test results, reviewed documented beta blocker date and time   Airway Mallampati: II  TM Distance: >3 FB Neck ROM: full    Dental no notable dental hx. (+) Teeth Intact   Pulmonary asthma , former smoker,    Pulmonary exam normal breath sounds clear to auscultation       Cardiovascular Exercise Tolerance: Good hypertension, + CAD   Rhythm:regular Rate:Normal     Neuro/Psych negative neurological ROS  negative psych ROS   GI/Hepatic Neg liver ROS, GERD  Medicated,  Endo/Other  negative endocrine ROS  Renal/GU negative Renal ROS  negative genitourinary   Musculoskeletal   Abdominal   Peds  Hematology negative hematology ROS (+)   Anesthesia Other Findings 1. Left ventricular ejection fraction, by estimation, is 60 to 65%. The  left ventricle has normal function. The left ventricle has no regional  wall motion abnormalities. There is moderate left ventricular hypertrophy.  Left ventricular diastolic  parameters are indeterminate.  2. Right ventricular systolic function is normal. The right ventricular  size is normal. Tricuspid regurgitation signal is inadequate for assessing  PA pressure.  3. Left atrial size was upper normal.  4. There is a trivial pericardial effusion posterior to the left  ventricle.  5. The mitral valve is abnormal, mildly thickened with somewhat  restricted motion. Mild to moderate mitral valve regurgitation.  6. The aortic valve is tricuspid. Aortic valve regurgitation is mild to  moderate. Aortic regurgitation PHT measures 339 msec. Aortic valve mean  gradient measures 9.0 mmHg.  7. The inferior vena cava is normal in size with greater than 50%  respiratory variability, suggesting right atrial pressure of  3 mmHg.   Reproductive/Obstetrics negative OB ROS                             Anesthesia Physical Anesthesia Plan  ASA: 2  Anesthesia Plan: General   Post-op Pain Management:    Induction:   PONV Risk Score and Plan: Propofol infusion  Airway Management Planned:   Additional Equipment:   Intra-op Plan:   Post-operative Plan:   Informed Consent: I have reviewed the patients History and Physical, chart, labs and discussed the procedure including the risks, benefits and alternatives for the proposed anesthesia with the patient or authorized representative who has indicated his/her understanding and acceptance.     Dental Advisory Given  Plan Discussed with: CRNA  Anesthesia Plan Comments:         Anesthesia Quick Evaluation

## 2020-12-15 NOTE — Transfer of Care (Signed)
Immediate Anesthesia Transfer of Care Note  Patient: Destiny Manning  Procedure(s) Performed: LAPAROSCOPIC BILATERAL SALPINGO OOPHORECTOMY (Bilateral: Abdomen)  Patient Location: PACU  Anesthesia Type:General  Level of Consciousness: awake and alert   Airway & Oxygen Therapy: Patient Spontanous Breathing and Patient connected to nasal cannula oxygen  Post-op Assessment: Report given to RN and Post -op Vital signs reviewed and stable  Post vital signs: Reviewed and stable  Last Vitals:  Vitals Value Taken Time  BP 124/70 12/15/20 1123  Temp    Pulse 85 12/15/20 1124  Resp 19 12/15/20 1124  SpO2 100 % 12/15/20 1124  Vitals shown include unvalidated device data.  Last Pain:  Vitals:   12/15/20 0845  PainSc: 0-No pain         Complications: No notable events documented.

## 2020-12-15 NOTE — Anesthesia Procedure Notes (Signed)
Procedure Name: Intubation Date/Time: 12/15/2020 10:27 AM Performed by: Riki Sheer, CRNA Pre-anesthesia Checklist: Patient identified, Emergency Drugs available, Suction available, Patient being monitored and Timeout performed Patient Re-evaluated:Patient Re-evaluated prior to induction Oxygen Delivery Method: Circle system utilized Preoxygenation: Pre-oxygenation with 100% oxygen Induction Type: IV induction Ventilation: Mask ventilation without difficulty Laryngoscope Size: Miller and 2 Grade View: Grade I Tube type: Oral Tube size: 7.0 mm Number of attempts: 1 Airway Equipment and Method: Stylet Placement Confirmation: ETT inserted through vocal cords under direct vision, positive ETCO2, CO2 detector and breath sounds checked- equal and bilateral Secured at: 22 cm Tube secured with: Tape Dental Injury: Teeth and Oropharynx as per pre-operative assessment

## 2020-12-15 NOTE — Anesthesia Postprocedure Evaluation (Signed)
Anesthesia Post Note  Patient: Destiny Manning  Procedure(s) Performed: LAPAROSCOPIC BILATERAL SALPINGO OOPHORECTOMY (Bilateral: Abdomen)  Patient location during evaluation: Phase II Anesthesia Type: General Level of consciousness: awake Pain management: pain level controlled Vital Signs Assessment: post-procedure vital signs reviewed and stable Respiratory status: spontaneous breathing and respiratory function stable Cardiovascular status: blood pressure returned to baseline and stable Postop Assessment: no headache and no apparent nausea or vomiting Anesthetic complications: no Comments: Late entry   No notable events documented.   Last Vitals:  Vitals:   12/15/20 1215 12/15/20 1225  BP: 124/69 135/69  Pulse: 76 75  Resp: 13 16  Temp:  36.4 C  SpO2: 100% 99%    Last Pain:  Vitals:   12/15/20 1225  TempSrc: Oral  PainSc: Burna

## 2020-12-15 NOTE — H&P (Signed)
Preoperative History and Physical  Destiny Manning is a 50 y.o. [id number] with Patient's last menstrual period was 07/04/2010. admitted for a laparoscopic bilateral oophorectomy due to chronic bilateral pelvic pain with negative work up thus far.  Potentially adhesions of the ovaries as source .  Discussed options with patient who believes ovaries to be the source of pain  PMH:    Past Medical History:  Diagnosis Date   Asthma dx 06/04/07   ventolin, singulair   Blood transfusion without reported diagnosis    CHF (congestive heart failure) ([place]) 03/19/2008   a. 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 normalized by repeat imaging and at 55-60% in 04/2019,   DCM (dilated cardiomyopathy) ([place]) dx 06/06/07   EF 10-15%   Degeneration of spine    DJD (degenerative joint disease), lumbar 11/16/2016   chronic lumbar pain   GERD (gastroesophageal reflux disease)    takes Nexium Daily   Goiter 09/09/2020   Hypertension    Seasonal allergies    takes zyrtec    PSH:     Past Surgical History:  Procedure Laterality Date   ABDOMINAL HYSTERECTOMY     fibroids; partial- ovaries intact   CERVICAL SPINE SURGERY  2002   [facility], ruptured disc   CHOLECYSTECTOMY  2001   laparoscopic, [facility]   COLONOSCOPY WITH PROPOFOL N/A 07/12/2017   tubular adenoma (10 mm polyp in cecum), segmental biopsies negative. 5 year surveillance   ESOPHAGOGASTRODUODENOSCOPY (EGD) WITH ESOPHAGEAL DILATION N/A 09/05/2012   [id number] hiatal hernia s/p esophageal dilation with 52 F Maloney.    ESOPHAGOGASTRODUODENOSCOPY (EGD) WITH PROPOFOL N/A 07/12/2017   normal esophagus s/p dilation, normal duodenal bulb and second portion of duodenum   MALONEY DILATION N/A 07/12/2017   Procedure: MALONEY DILATION;  Surgeon: [name], MD;  Location: [facility];  Service: Endoscopy;  Laterality: N/A;   POLYPECTOMY  07/12/2017   Procedure: POLYPECTOMY;  Surgeon: [name], MD;  Location: [facility];  Service:  Endoscopy;;  Cecal polyp (HS)   SPINE SURGERY     TUBAL LIGATION     VAGINAL HYSTERECTOMY  10/19/2010   Procedure: HYSTERECTOMY VAGINAL;  Surgeon: [name], MD;  Location: [facility] ORS;  Service: Gynecology;  Laterality: N/A;    POb/GynH:      OB History     Gravida  3   Para  2   Term  2   Preterm      AB  1   Living  2      SAB  1   IAB      Ectopic      Multiple      Live Births  2           SH:   Social History   Tobacco Use   Smoking status: Former    Packs/day: 0.25    Years: 4.00    Pack years: 1.00    Types: Cigarettes    Quit date: 03/14/2009    Years since quitting: 11.7   Smokeless tobacco: Never  Vaping Use   Vaping Use: Never used  Substance Use Topics   Alcohol use: No   Drug use: No    FH:    Family History  Problem Relation Age of Onset   Diabetes Mother    Hypertension Mother    Congestive Heart Failure Father 54   Arthritis Father    Dementia Maternal Grandmother    Congestive Heart Failure Paternal Grandmother  Congestive Heart Failure Paternal Grandfather    Cancer Maternal Uncle    Anesthesia problems Neg Hx    Hypotension Neg Hx    Pseudochol deficiency Neg Hx    Malignant hyperthermia Neg Hx    Colon cancer Neg Hx    Colon polyps Neg Hx      Allergies: No Known Allergies  Medications:       Current Facility-Administered Medications:    bupivacaine liposome (EXPAREL) 1.3 % injection 266 mg, 20 mL, Infiltration, Once, Estefania Kamiya, Mertie Clause, MD   ceFAZolin (ANCEF) 2-4 GM/100ML-% IVPB, , , ,    ceFAZolin (ANCEF) IVPB 2g/100 mL premix, 2 g, Intravenous, On Call to OR, [name], MD   ketorolac (TORADOL) 30 MG/ML injection, , , ,    lactated ringers infusion, , Intravenous, Continuous, Kiel, Coralie Keens, MD, Last Rate: 10 mL/hr at 12/15/20 0954, New Bag at 12/15/20 0954   povidone-iodine 10 % swab 2 application, 2 application, Topical, Once, [name], MD  Review of Systems:   Review of Systems   Constitutional: Negative for fever, chills, weight loss, malaise/fatigue and diaphoresis.  HENT: Negative for hearing loss, ear pain, nosebleeds, congestion, sore throat, neck pain, tinnitus and ear discharge.   Eyes: Negative for blurred vision, double vision, photophobia, pain, discharge and redness.  Respiratory: Negative for cough, hemoptysis, sputum production, shortness of breath, wheezing and stridor.   Cardiovascular: Negative for chest pain, palpitations, orthopnea, claudication, leg swelling and PND.  Gastrointestinal: Positive for abdominal pain. Negative for heartburn, nausea, vomiting, diarrhea, constipation, blood in stool and melena.  Genitourinary: Negative for dysuria, urgency, frequency, hematuria and flank pain.  Musculoskeletal: Negative for myalgias, back pain, joint pain and falls.  Skin: Negative for itching and rash.  Neurological: Negative for dizziness, tingling, tremors, sensory change, speech change, focal weakness, seizures, loss of consciousness, weakness and headaches.  Endo/Heme/Allergies: Negative for environmental allergies and polydipsia. Does not bruise/bleed easily.  Psychiatric/Behavioral: Negative for depression, suicidal ideas, hallucinations, memory loss and substance abuse. The patient is not nervous/anxious and does not have insomnia.      PHYSICAL EXAM:  Blood pressure 122/72, pulse 77, temperature 98.4 F (36.9 C), resp. rate 13, last menstrual period 07/04/2010, SpO2 100 %.    Vitals reviewed. Constitutional: She is oriented to person, place, and time. She appears well-developed and well-nourished.  HENT:  Head: Normocephalic and atraumatic.  Right Ear: External ear normal.  Left Ear: External ear normal.  Nose: Nose normal.  Mouth/Throat: Oropharynx is clear and moist.  Eyes: Conjunctivae and EOM are normal. Pupils are equal, round, and reactive to light. Right eye exhibits no discharge. Left eye exhibits no discharge. No scleral icterus.   Neck: Normal range of motion. Neck supple. No tracheal deviation present. No thyromegaly present.  Cardiovascular: Normal rate, regular rhythm, normal heart sounds and intact distal pulses.  Exam reveals no gallop and no friction rub.   No murmur heard. Respiratory: Effort normal and breath sounds normal. No respiratory distress. She has no wheezes. She has no rales. She exhibits no tenderness.  GI: Soft. Bowel sounds are normal. She exhibits no distension and no mass. There is tenderness. There is no rebound and no guarding.  Genitourinary:       Vulva is normal without lesions Vagina is pink moist without discharge Cervix normal in appearance and pap is normal Uterus is uterus absent Adnexa is negative with normal sized ovaries by sonogram poor slide on sonogram Musculoskeletal: Normal range of motion. She exhibits  no edema and no tenderness.  Neurological: She is alert and oriented to person, place, and time. She has normal reflexes. She displays normal reflexes. No cranial nerve deficit. She exhibits normal muscle tone. Coordination normal.  Skin: Skin is warm and dry. No rash noted. No erythema. No pallor.  Psychiatric: She has a normal mood and affect. Her behavior is normal. Judgment and thought content normal.    Labs: Results for orders placed or performed during the hospital encounter of 12/15/20 (from the past 336 hour(s))  I-STAT, chem 8   Collection Time: 12/15/20  9:19 AM  Result Value Ref Range   Sodium 140 135 - 145 mmol/L   Potassium 3.6 3.5 - 5.1 mmol/L   Chloride 99 98 - 111 mmol/L   BUN 11 6 - 20 mg/dL   Creatinine, Ser 0.80 0.44 - 1.00 mg/dL   Glucose, Bld 99 70 - 99 mg/dL   Calcium, Ion 1.12 (L) 1.15 - 1.40 mmol/L   TCO2 31 22 - 32 mmol/L   Hemoglobin 13.9 12.0 - 15.0 g/dL   HCT 41.0 36.0 - 46.0 %  Results for orders placed or performed during the hospital encounter of 12/09/20 (from the past 336 hour(s))  CBC   Collection Time: 12/09/20 11:58 AM  Result  Value Ref Range   WBC 7.5 4.0 - 10.5 K/uL   RBC 4.48 3.87 - 5.11 MIL/uL   Hemoglobin 14.2 12.0 - 15.0 g/dL   HCT 40.1 36.0 - 46.0 %   MCV 89.5 80.0 - 100.0 fL   MCH 31.7 26.0 - 34.0 pg   MCHC 35.4 30.0 - 36.0 g/dL   RDW 14.2 11.5 - 15.5 %   Platelets 253 150 - 400 K/uL   nRBC 0.0 0.0 - 0.2 %  Comprehensive metabolic panel   Collection Time: 12/09/20 11:58 AM  Result Value Ref Range   Sodium 136 135 - 145 mmol/L   Potassium 2.8 (L) 3.5 - 5.1 mmol/L   Chloride 100 98 - 111 mmol/L   CO2 28 22 - 32 mmol/L   Glucose, Bld 95 70 - 99 mg/dL   BUN 12 6 - 20 mg/dL   Creatinine, Ser 0.82 0.44 - 1.00 mg/dL   Calcium 8.9 8.9 - 10.3 mg/dL   Total Protein 7.3 6.5 - 8.1 g/dL   Albumin 3.9 3.5 - 5.0 g/dL   AST 18 15 - 41 U/L   ALT 12 0 - 44 U/L   Alkaline Phosphatase 58 38 - 126 U/L   Total Bilirubin 0.5 0.3 - 1.2 mg/dL   GFR, Estimated >60 >60 mL/min   Anion gap 8 5 - 15  Rapid HIV screen (HIV 1/2 Ab+Ag)   Collection Time: 12/09/20 11:58 AM  Result Value Ref Range   HIV-1 P24 Antigen - HIV24 NON REACTIVE NON REACTIVE   HIV 1/2 Antibodies NON REACTIVE NON REACTIVE   Interpretation (HIV Ag Ab)      A non reactive test result means that HIV 1 or HIV 2 antibodies and HIV 1 p24 antigen were not detected in the specimen.  Urinalysis, Routine w reflex microscopic Urine, Clean Catch   Collection Time: 12/09/20 11:58 AM  Result Value Ref Range   Color, Urine YELLOW YELLOW   APPearance CLEAR CLEAR   Specific Gravity, Urine 1.012 1.005 - 1.030   pH 6.0 5.0 - 8.0   Glucose, UA NEGATIVE NEGATIVE mg/dL   Hgb urine dipstick MODERATE (A) NEGATIVE   Bilirubin Urine NEGATIVE NEGATIVE   Ketones, ur NEGATIVE  NEGATIVE mg/dL   Protein, ur NEGATIVE NEGATIVE mg/dL   Nitrite NEGATIVE NEGATIVE   Leukocytes,Ua NEGATIVE NEGATIVE   RBC / HPF 11-20 0 - 5 RBC/hpf   WBC, UA 0-5 0 - 5 WBC/hpf   Bacteria, UA NONE SEEN NONE SEEN   Squamous Epithelial / LPF 0-5 0 - 5  Type and screen   Collection Time: 12/09/20  11:58 AM  Result Value Ref Range   ABO/RH(D) B POS    Antibody Screen NEG    Sample Expiration      12/23/2020,2359 Performed at [facility], [address] [place], [place] [address]     EKG: Orders placed or performed in visit on 07/01/20   EKG 12-Lead    Imaging Studies: MR Lumbar Spine Wo Contrast  Result Date: 11/28/2020 CLINICAL DATA:  Low back pain for over 6 weeks. No known injury. EXAM: MRI LUMBAR SPINE WITHOUT CONTRAST TECHNIQUE: Multiplanar, multisequence MR imaging of the lumbar spine was performed. No intravenous contrast was administered. COMPARISON:  None. FINDINGS: Segmentation:  Standard. Alignment:  Physiologic. Vertebrae: No acute fracture, evidence of discitis, or aggressive bone lesion. Conus medullaris and cauda equina: Conus extends to the L1 level. Conus and cauda equina appear normal. Paraspinal and other soft tissues: No acute paraspinal abnormality. Disc levels: Disc spaces: Disc desiccation at L4-5 and L5-S1. T11-12: Tiny left paracentral/foraminal disc protrusion. No foraminal or central canal stenosis. T12-L1: No significant disc bulge. No neural foraminal stenosis. No central canal stenosis. L1-L2: No significant disc bulge. No neural foraminal stenosis. No central canal stenosis. L2-L3: No significant disc bulge. No neural foraminal stenosis. No central canal stenosis. L3-L4: No significant disc bulge. No neural foraminal stenosis. No central canal stenosis. L4-L5: Mild broad-based disc bulge. No foraminal or central canal stenosis. L5-S1: No significant disc bulge. No neural foraminal stenosis. No central canal stenosis. IMPRESSION: 1. At T11-12 there is a tiny left paracentral/foraminal disc protrusion. 2. At L4-5 there is a mild broad-based disc bulge. 3. No acute osseous injury of the lumbar spine. Electronically Signed   By: Kathreen Devoid M.D.   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

## 2020-12-16 ENCOUNTER — Encounter (HOSPITAL_COMMUNITY): Payer: Self-pay | Admitting: Obstetrics & Gynecology

## 2020-12-16 ENCOUNTER — Encounter: Payer: Self-pay | Admitting: Obstetrics & Gynecology

## 2020-12-16 LAB — SURGICAL PATHOLOGY

## 2020-12-21 ENCOUNTER — Telehealth: Payer: Self-pay | Admitting: Obstetrics & Gynecology

## 2020-12-21 NOTE — Telephone Encounter (Signed)
Called pt in inquire about type of dressing. Pt stated that it was a clear dressing and she was having a lot of difficulty removing it because it was adhering to her skin so well. Discussed with Tish, who said that it should be a honeycomb dressing and the pt should get in the shower to use the water to help release it from the skin.  Called pt to ask if it was a honeycomb dressing. She stated that it had wrinkly lines in it. Pt was instructed to remove it in the shower. Pt confirmed understanding.

## 2020-12-21 NOTE — Telephone Encounter (Signed)
Patient says she didn't see where she had to take the tape off after 72 hours and is now having a hard time getting it off. Can you help her with some remedies to help get it off?

## 2020-12-27 ENCOUNTER — Other Ambulatory Visit: Payer: Self-pay | Admitting: Obstetrics & Gynecology

## 2020-12-27 ENCOUNTER — Telehealth (INDEPENDENT_AMBULATORY_CARE_PROVIDER_SITE_OTHER): Payer: Medicaid Other | Admitting: Obstetrics & Gynecology

## 2020-12-27 ENCOUNTER — Encounter: Payer: Self-pay | Admitting: Obstetrics & Gynecology

## 2020-12-27 VITALS — Ht 63.0 in

## 2020-12-27 DIAGNOSIS — Z9889 Other specified postprocedural states: Secondary | ICD-10-CM

## 2020-12-27 MED ORDER — ESTRADIOL 2 MG PO TABS: 2.0000 mg | ORAL_TABLET | Freq: Every day | ORAL | 11 refills | Status: DC

## 2020-12-27 MED ORDER — KETOROLAC TROMETHAMINE 10 MG PO TABS: 10.0000 mg | ORAL_TABLET | Freq: Three times a day (TID) | ORAL | 0 refills | Status: DC | PRN

## 2020-12-27 NOTE — Progress Notes (Signed)
MyChart video visit Patient is at home I am in my office Time: 10 minutes  HPI: Patient returns for routine postoperative follow-up having undergone laparoscopic BSO on 12/15/20.  The patient's immediate postoperative recovery has been unremarkable. Since hospital discharge the patient reports some pain with.   Current Outpatient Medications: albuterol (VENTOLIN HFA) 108 (90 Base) MCG/ACT inhaler, Inhale 2 puffs into the lungs every 6 (six) hours as needed for wheezing or shortness of breath., Disp: , Rfl:  AMITIZA 8 MCG capsule, TAKE (1) CAPSULE BY MOUTH TWICE DAILY., Disp: 180 capsule, Rfl: 1 atorvastatin (LIPITOR) 10 MG tablet, Take 1 tablet (10 mg total) by mouth daily. (Patient taking differently: Take 10 mg by mouth at bedtime.), Disp: 90 tablet, Rfl: 3 Azelastine HCl 137 MCG/SPRAY SOLN, Place 1-2 sprays into both nostrils in the morning., Disp: , Rfl:  baclofen (LIORESAL) 10 MG tablet, Take 10 mg by mouth 3 (three) times daily as needed for muscle spasms., Disp: , Rfl:  carvedilol (COREG) 25 MG tablet, Take 1 tablet (25 mg total) by mouth 2 (two) times daily., Disp: 180 tablet, Rfl: 3 cetirizine (ZYRTEC) 10 MG tablet, Take 1 tablet (10 mg total) by mouth daily., Disp: 30 tablet, Rfl: 1 chlorthalidone (HYGROTON) 25 MG tablet, Take 0.5 tablets (12.5 mg total) by mouth daily. (Patient taking differently: Take by mouth daily.), Disp: 45 tablet, Rfl: 3 DEXILANT 60 MG capsule, TAKE ONE CAPSULE BY MOUTH ONCE DAILY., Disp: 30 capsule, Rfl: 5 estradiol (ESTRACE) 2 MG tablet, Take 1 tablet (2 mg total) by mouth daily., Disp: 30 tablet, Rfl: 11 fluticasone (FLONASE) 50 MCG/ACT nasal spray, Place 1 spray daily into both nostrils. (Patient taking differently: Place 1 spray into both nostrils 2 (two) times daily.), Disp: 16 g, Rfl: 3 Fluticasone-Salmeterol (ADVAIR) 500-50 MCG/DOSE AEPB, Inhale 1 puff into the lungs 2 (two) times daily as needed (respiratory issues.)., Disp: , Rfl:  gabapentin  (NEURONTIN) 800 MG tablet, Take 800 mg by mouth 3 (three) times daily., Disp: , Rfl:  losartan (COZAAR) 100 MG tablet, Take 1 tablet (100 mg total) by mouth daily., Disp: 90 tablet, Rfl: 3 montelukast (SINGULAIR) 10 MG tablet, Take 10 mg by mouth at bedtime., Disp: , Rfl:  ondansetron (ZOFRAN ODT) 8 MG disintegrating tablet, Take 1 tablet (8 mg total) by mouth every 8 (eight) hours as needed for nausea or vomiting., Disp: 8 tablet, Rfl: 0 potassium chloride SA (KLOR-CON) 20 MEQ tablet, Take 1 tablet (20 mEq total) by mouth 3 (three) times daily., Disp: 60 tablet, Rfl: 0 SUMAtriptan (IMITREX) 50 MG tablet, Take 50 mg by mouth 2 (two) times daily as needed for migraine. May repeat in 2 hours if headache persists or recurs., Disp: , Rfl:  HYDROcodone-acetaminophen (NORCO/VICODIN) 5-325 MG tablet, Take 1-2 tablets by mouth every 6 (six) hours as needed. (Patient not taking: Reported on 12/27/2020), Disp: 15 tablet, Rfl: 0 ketorolac (TORADOL) 10 MG tablet, Take 1 tablet (10 mg total) by mouth every 8 (eight) hours as needed. (Patient not taking: Reported on 12/27/2020), Disp: 15 tablet, Rfl: 0  No current facility-administered medications for this visit.    Height '5\' 3"'$  (1.6 m), last menstrual period 07/04/2010.  Physical Exam: Gen WDWN NAD  Diagnostic Tests:   Pathology: benign  Impression:    ICD-10-CM   1. Post-operative state: laparoscopic BSO  Z98.890        Plan: Orders Placed This Encounter Meds ordered this encounter  Medications   estradiol (ESTRACE) 2 MG tablet    Sig: Take 1  tablet (2 mg total) by mouth daily.    Dispense:  30 tablet    Refill:  11   ketorolac (TORADOL) 10 MG tablet    Sig: Take 1 tablet (10 mg total) by mouth every 8 (eight) hours as needed.    Dispense:  15 tablet    Refill:  0       Follow up: No follow-ups on file.   [name], MD

## 2021-01-11 ENCOUNTER — Encounter: Payer: Self-pay | Admitting: Internal Medicine

## 2021-01-11 ENCOUNTER — Other Ambulatory Visit: Payer: Self-pay | Admitting: Internal Medicine

## 2021-01-11 ENCOUNTER — Other Ambulatory Visit: Payer: Self-pay

## 2021-01-11 ENCOUNTER — Ambulatory Visit: Payer: Medicaid Other | Admitting: Internal Medicine

## 2021-01-11 VITALS — BP 117/72 | HR 93 | Temp 97.5°F | Ht 63.0 in | Wt 158.8 lb

## 2021-01-11 DIAGNOSIS — K219 Gastro-esophageal reflux disease without esophagitis: Secondary | ICD-10-CM

## 2021-01-11 DIAGNOSIS — K5900 Constipation, unspecified: Secondary | ICD-10-CM | POA: Diagnosis not present

## 2021-01-11 DIAGNOSIS — R1319 Other dysphagia: Secondary | ICD-10-CM

## 2021-01-11 MED ORDER — LUBIPROSTONE 8 MCG PO CAPS: ORAL_CAPSULE | ORAL | 3 refills | Status: DC

## 2021-01-11 MED ORDER — DEXLANSOPRAZOLE 60 MG PO CPDR: 1.0000 | DELAYED_RELEASE_CAPSULE | Freq: Every day | ORAL | 3 refills | Status: DC

## 2021-01-11 NOTE — Progress Notes (Signed)
Primary Care Physician:  Noreene Larsson, NP Primary Gastroenterologist:  Dr. Gala Romney  Pre-Procedure History & Physical: HPI:  Destiny Manning is a 50 y.o. female here for further evaluation of recurrent esophageal dysphagia.  History of recurrent esophageal dysphagia responded for years to empiric dilation in 2019 and 2014.  Reflux symptoms now well controlled on Dexilant 60 mg daily.  Unremarkable tubular esophagus previously.  No prior biopsies.  Not having any nausea vomiting melena or rectal bleeding.  She recently underwent bilateral oophorectomy (2 weeks ago) for chronic pelvic pain.  Heart failure well controlled per her report.  Sees her cardiologist yearly.  She is not anticoagulated. . Chronic constipation well managed with Amitiza 8 mcg twice daily the.  History of colonic adenoma; due for surveillance colonoscopy 2024 Past Medical History:  Diagnosis Date   Asthma dx 06/04/07   ventolin, singulair   Blood transfusion without reported diagnosis    CHF (congestive heart failure) ([place]) 03/19/2008   a. 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 normalized by repeat imaging and at 55-60% in 04/2019,   DCM (dilated cardiomyopathy) ([place]) dx 06/06/07   EF 10-15%   Degeneration of spine    DJD (degenerative joint disease), lumbar 11/16/2016   chronic lumbar pain   GERD (gastroesophageal reflux disease)    takes Nexium Daily   Goiter 09/09/2020   Hypertension    Seasonal allergies    takes zyrtec    Past Surgical History:  Procedure Laterality Date   ABDOMINAL HYSTERECTOMY     fibroids; partial- ovaries intact   CERVICAL SPINE SURGERY  2002   [facility], ruptured disc   CHOLECYSTECTOMY  2001   laparoscopic, [facility]   COLONOSCOPY WITH PROPOFOL N/A 07/12/2017   tubular adenoma (10 mm polyp in cecum), segmental biopsies negative. 5 year surveillance   ESOPHAGOGASTRODUODENOSCOPY (EGD) WITH ESOPHAGEAL DILATION N/A 09/05/2012   QJJ:HERDE hiatal hernia s/p esophageal dilation  with [address] F [place].    ESOPHAGOGASTRODUODENOSCOPY (EGD) WITH PROPOFOL N/A 07/12/2017   normal esophagus s/p dilation, normal duodenal bulb and second portion of duodenum   LAPAROSCOPIC BILATERAL SALPINGO OOPHERECTOMY Bilateral 12/15/2020   Procedure: LAPAROSCOPIC BILATERAL SALPINGO OOPHORECTOMY;  Surgeon: [name], MD;  Location: [facility];  Service: Gynecology;  Laterality: Bilateral;   MALONEY DILATION N/A 07/12/2017   Procedure: [name] DILATION;  Surgeon: [name], MD;  Location: [facility];  Service: Endoscopy;  Laterality: N/A;   POLYPECTOMY  07/12/2017   Procedure: POLYPECTOMY;  Surgeon: [name], MD;  Location: [facility];  Service: Endoscopy;;  Cecal polyp ([facility])   SPINE SURGERY     TUBAL LIGATION     VAGINAL HYSTERECTOMY  10/19/2010   Procedure: HYSTERECTOMY VAGINAL;  Surgeon: [name], MD;  Location: [facility];  Service: Gynecology;  Laterality: N/A;    Prior to Admission medications   Medication Sig Start Date End Date Taking? Authorizing Provider  albuterol (VENTOLIN HFA) 108 (90 Base) MCG/ACT inhaler Inhale 2 puffs into the lungs every 6 (six) hours as needed for wheezing or shortness of breath.   Yes [provider]  AMITIZA 8 MCG capsule TAKE (1) CAPSULE BY MOUTH TWICE DAILY. 11/09/20  Yes Erenest Rasher, PA-C  atorvastatin (LIPITOR) 10 MG tablet Take 1 tablet (10 mg total) by mouth daily. Patient taking differently: Take 10 mg by mouth at bedtime. 08/03/20  Yes Strader, [place] M, PA-C  Azelastine HCl 137 MCG/SPRAY SOLN Place 1-2 sprays into both nostrils in  the morning. 07/12/20  Yes [provider]  baclofen (LIORESAL) 10 MG tablet Take 10 mg by mouth 3 (three) times daily as needed for muscle spasms.   Yes [provider]  carvedilol (COREG) 25 MG tablet Take 1 tablet (25 mg total) by mouth 2 (two) times daily. 08/03/20  Yes Strader, [place] M, PA-C  cetirizine (ZYRTEC) 10 MG tablet Take 1 tablet (10 mg total) by mouth daily. 06/22/17   Yes Raylene Everts, MD  chlorthalidone (HYGROTON) 25 MG tablet Take 0.5 tablets (12.5 mg total) by mouth daily. Patient taking differently: Take by mouth daily. 08/03/20 07/29/21 Yes Strader, [place] M, PA-C  DEXILANT 60 MG capsule TAKE ONE CAPSULE BY MOUTH ONCE DAILY. 07/06/20  Yes Erenest Rasher, PA-C  estradiol (ESTRACE) 2 MG tablet Take 1 tablet (2 mg total) by mouth daily. 12/27/20  Yes [name], MD  fluticasone (FLONASE) 50 MCG/ACT nasal spray Place 1 spray daily into both nostrils. Patient taking differently: Place 1 spray into both nostrils 2 (two) times daily. 02/12/17  Yes Raylene Everts, MD  Fluticasone-Salmeterol (ADVAIR) 500-50 MCG/DOSE AEPB Inhale 1 puff into the lungs 2 (two) times daily as needed (respiratory issues.).   Yes [provider]  gabapentin (NEURONTIN) 800 MG tablet Take 800 mg by mouth 3 (three) times daily. 08/11/20  Yes [provider]  ketorolac (TORADOL) 10 MG tablet Take 1 tablet (10 mg total) by mouth every 8 (eight) hours as needed. 12/27/20  Yes [name], MD  losartan (COZAAR) 100 MG tablet Take 1 tablet (100 mg total) by mouth daily. 08/03/20  Yes Strader, [place] M, PA-C  montelukast (SINGULAIR) 10 MG tablet Take 10 mg by mouth at bedtime.   Yes [provider]  ondansetron (ZOFRAN ODT) 8 MG disintegrating tablet Take 1 tablet (8 mg total) by mouth every 8 (eight) hours as needed for nausea or vomiting. 12/15/20  Yes [name], MD  potassium chloride SA (KLOR-CON) 20 MEQ tablet Take 1 tablet (20 mEq total) by mouth 3 (three) times daily. 12/10/20  Yes [name], MD  SUMAtriptan (IMITREX) 50 MG tablet Take 50 mg by mouth 2 (two) times daily as needed for migraine. May repeat in 2 hours if headache persists or recurs.   Yes [provider]    Allergies as of 01/11/2021   (No Known Allergies)    Family History  Problem Relation Age of Onset   Diabetes Mother    Hypertension Mother    Congestive  Heart Failure Father 63   Arthritis Father    Dementia Maternal Grandmother    Congestive Heart Failure Paternal Grandmother    Congestive Heart Failure Paternal Grandfather    Cancer Maternal Uncle    Anesthesia problems Neg Hx    Hypotension Neg Hx    Pseudochol deficiency Neg Hx    Malignant hyperthermia Neg Hx    Colon cancer Neg Hx    Colon polyps Neg Hx     Social History   Socioeconomic History   Marital status: Single    Spouse name: Not on file   Number of children: 2   Years of education: 16   Highest education level: Not on file  Occupational History   Occupation: [employer]    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  Tobacco Use   Smoking status: Former    Packs/day: 0.25    Years: 4.00    Pack years: 1.00    Types: Cigarettes  Quit date: 03/14/2009    Years since quitting: 11.8   Smokeless tobacco: Never  Vaping Use   Vaping Use: Never used  Substance and Sexual Activity   Alcohol use: No   Drug use: No   Sexual activity: Not Currently    Birth control/protection: Surgical    Comment: hyst  Other Topics Concern   Not on file  Social History Narrative   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[employer]; 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      Lives with 2 children- age 55 and 75; she homeschools them   Social Determinants of Health   Financial Resource Strain: High Risk   Difficulty of Paying Living Expenses: Hard  Food Insecurity: No Food Insecurity   Worried About Charity fundraiser in the Last Year: Never true   Ran Out of Food in the Last Year: Never true  Transportation Needs: No Transportation Needs   Lack of Transportation (Medical): No   Lack of Transportation (Non-Medical): No  Physical Activity: Insufficiently Active   Days of Exercise per Week: 1 day   Minutes of Exercise per Session: 20 min  Stress: No Stress Concern Present   Feeling of Stress : Not at all  Social Connections: Socially Isolated   Frequency of Communication with Friends and Family:  Once a week   Frequency of Social Gatherings with Friends and Family: Never   Attends Religious Services: More than 4 times per year   Active Member of [employer] or Organizations: No   Attends Music therapist: Never   Marital Status: Never married  Human resources officer Violence: Not At Risk   Fear of Current or Ex-Partner: No   Emotionally Abused: No   Physically Abused: No   Sexually Abused: No    Review of Systems: See HPI, otherwise negative ROS  Physical Exam: BP 117/72   Pulse 93   Temp (!) 97.5 F (36.4 C) (Temporal)   Ht 5\' 3"  (1.6 m)   Wt 158 lb 12.8 oz (72 kg)   LMP 07/04/2010   BMI 28.13 kg/m  General:   Alert,  lpleasant and cooperative in NAD Neck:  Supple; no masses or thyromegaly. No significant cervical adenopathy. Lungs:  Clear throughout to auscultation.   No wheezes, crackles, or rhonchi. No acute distress. Heart:  Regular rate and rhythm; no murmurs, clicks, rubs,  or gallops. Abdomen: Declined.  Patient did not want her abdomen touch for physical examination given postsurgical soreness.    Pulses:  Normal pulses noted. Extremities:  Without clubbing or edema.  Impression/Plan: 50 year old lady with longstanding GERD-well-controlled on Dexilant 60 mg daily tear.  Now with recurrent esophageal dysphagia.  Previously, no structural lesion found but she has enjoyed fairly prolonged periods of time dysphagia free after relatively large bore [facility] dilation.  It is possible patient may have an occult ring or web which was dilated previously and is tightened back up.  Alternatively, she could have a EOE or underlying motility disorder.  Talked about how to pursue this further.  We talked about going directly to EGD versus a barium pill esophagram.  It was pointed out that the if the barium x-ray were negative her symptoms would likely need to be treated with a repeat dilation.  Therefore was mutually agreed we we will proceed directly with a repeat  EGD.   Constipation well managed with Amitiza 8 mcg twice daily.  History of colonic adenomas; due for surveillance colonoscopy 2024  Recommendations:  As discussed, we can start out doing  an x-ray or going directly to endoscopy.  Either way, you have benefited from a stretch previously.  We have mutually agreed to go ahead and proceed with a diagnostic EGD with esophageal dilation as appropriate.  ASA 3/propofol.  The risks, benefits, limitations, alternatives and imponderables have been reviewed with the patient. Potential for esophageal dilation, biopsy, etc. have also been reviewed.  Questions have been answered. All parties agreeable.   Will hold off for 1 month given your recent surgery.  Continue Dexilant 60 mg daily (dispense 30 or 90 with a 1 year refill.  Continue Amitiza 8 mcg gelcaps twice daily (dispense 60 or 180) with a 1 year refill.  We will plan for a follow-up colonoscopy in 2024-given history of colon polyps.  Further recommendations to follow.   Notice: This dictation was prepared with Dragon dictation along with smaller phrase technology. Any transcriptional errors that result from this process are unintentional and may not be corrected upon review.

## 2021-01-11 NOTE — Patient Instructions (Signed)
It was good to see you today!  As discussed, we can start out doing an x-ray or going directly to endoscopy.  Either way, you have benefited from a stretch previously.  We have agreed to go ahead and proceed with a diagnostic EGD with esophageal dilation as appropriate.  ASA 3/propofol  Will hold off for 1 month given your recent surgery.  Continue Dexilant 60 mg daily (dispense 30 or 90 with a 1 year refill.  Continue Amitiza 8 mcg gelcaps twice daily (dispense 60 or 180) with a 1 year refill.  We will plan for a follow-up colonoscopy in 2024-given history of colon polyps.  Further recommendations to follow.

## 2021-01-21 ENCOUNTER — Telehealth: Payer: Self-pay | Admitting: *Deleted

## 2021-01-21 NOTE — Telephone Encounter (Signed)
Called pt. She has been scheduled for EGD/DIL with propofol asa 3 Dr. Gala Romney on 11/10. Patient aware will send instructions and pre-op appt to her via mychart. She voiced understanding.

## 2021-01-24 NOTE — Telephone Encounter (Signed)
Pre-op appt message sent to her via mychart

## 2021-02-03 ENCOUNTER — Telehealth: Payer: Self-pay

## 2021-02-03 NOTE — Telephone Encounter (Addendum)
Haskel Schroeder, RN FYI,  Insurance denied test and appeal was denied also.   Order closed.   FROM 10/05/20 MRA; Age advanced cerebral white matter T2 signal changes, nonspecific though may reflect mild chronic small vessel ischemia, migraines, or prior infection/inflammation.

## 2021-02-03 NOTE — Telephone Encounter (Signed)
Spoke with the pt to let her know that an MRA of the "head" had been ordered this past 11/4718 and the precert dept has been working on getting it prior authorized but even after appeals it had been denied.   I was reviewing with my Nurse supervisor and maybe since it was ordered of the "head" and not of the "brain" it was denied but can try to reorder but the pt was reluctant since she also had Lumbar MRI for Dr. Luna Glasgow this past August... she is asking if she needs to have it.   I advised her that I will forward for [name] to review.

## 2021-02-10 NOTE — Patient Instructions (Signed)
KALIMAH CAPURRO  02/10/2021     @PREFPERIOPPHARMACY @   Your procedure is scheduled on  02/17/2021.   Report to [facility] at  1000 A.M.   Call this number if you have problems the morning of surgery:  [phone]   Remember:  Follow the diet instructions given to you by the office.    Use your inhalers before you come and bring your rescue inhaler with you.    Take these medicines the morning of surgery with A SIP OF WATER         baclofen, carvedilol, zyrtec, dexilant, gabapentin, toradol (if needed), zofran (if needed), imitrex (if needed).    Do not wear jewelry, make-up or nail polish.  Do not wear lotions, powders, or perfumes, or deodorant.  Do not shave 48 hours prior to surgery.  Men may shave face and neck.  Do not bring valuables to the hospital.  [facility] is not responsible for any belongings or valuables.  Contacts, dentures or bridgework may not be worn into surgery.  Leave your suitcase in the car.  After surgery it may be brought to your room.  For patients admitted to the hospital, discharge time will be determined by your treatment team.  Patients discharged the day of surgery will not be allowed to drive home and must  have someone with them for 24 hours.    Special instructions:   DO NOT smoke tobacco or vape for 24 hours before your procedure.  Please read over the following fact sheets that you were given. Anesthesia Post-op Instructions and Care and Recovery After Surgery      Upper Endoscopy, Adult, Care After This sheet gives you information about how to care for yourself after your procedure. Your health care provider may also give you more specific instructions. If you have problems or questions, contact your health care provider. What can I expect after the procedure? After the procedure, it is common to have: A sore throat. Mild stomach pain or discomfort. Bloating. Nausea. Follow these instructions at home:  Follow  instructions from your health care provider about what to eat or drink after your procedure. Return to your normal activities as told by your health care provider. Ask your health care provider what activities are safe for you. Take over-the-counter and prescription medicines only as told by your health care provider. If you were given a sedative during the procedure, it can affect you for several hours. Do not drive or operate machinery until your health care provider says that it is safe. Keep all follow-up visits as told by your health care provider. This is important. Contact a health care provider if you have: A sore throat that lasts longer than one day. Trouble swallowing. Get help right away if: You vomit blood or your vomit looks like coffee grounds. You have: A fever. Bloody, black, or tarry stools. A severe sore throat or you cannot swallow. Difficulty breathing. Severe pain in your chest or abdomen. Summary After the procedure, it is common to have a sore throat, mild stomach discomfort, bloating, and nausea. If you were given a sedative during the procedure, it can affect you for several hours. Do not drive or operate machinery until your health care provider says that it is safe. Follow instructions from your health care provider about what to eat or drink after your procedure. Return to your normal activities as told by your health care provider. This information is not  intended to replace advice given to you by your health care provider. Make sure you discuss any questions you have with your health care provider. Document Revised: 03/25/2019 Document Reviewed: 08/27/2017 Elsevier Patient Education  2022 [place]. Esophageal Dilatation Esophageal dilatation, also called esophageal dilation, is a procedure to widen or open a blocked or narrowed part of the esophagus. The esophagus is the part of the body that moves food and liquid from the mouth to the stomach. You may  need this procedure if: You have a buildup of scar tissue in your esophagus that makes it difficult, painful, or impossible to swallow. This can be caused by gastroesophageal reflux disease (GERD). You have cancer of the esophagus. There is a problem with how food moves through your esophagus. In some cases, you may need this procedure repeated at a later time to dilate the esophagus gradually. Tell a health care provider about: Any allergies you have. All medicines you are taking, including vitamins, herbs, eye drops, creams, and over-the-counter medicines. Any problems you or family members have had with anesthetic medicines. Any blood disorders you have. Any surgeries you have had. Any medical conditions you have. Any antibiotic medicines you are required to take before dental procedures. Whether you are pregnant or may be pregnant. What are the risks? Generally, this is a safe procedure. However, problems may occur, including: Bleeding due to a tear in the lining of the esophagus. A hole, or perforation, in the esophagus. What happens before the procedure? Ask your health care provider about: Changing or stopping your regular medicines. This is especially important if you are taking diabetes medicines or blood thinners. Taking medicines such as aspirin and ibuprofen. These medicines can thin your blood. Do not take these medicines unless your health care provider tells you to take them. Taking over-the-counter medicines, vitamins, herbs, and supplements. Follow instructions from your health care provider about eating or drinking restrictions. Plan to have a responsible adult take you home from the hospital or clinic. Plan to have a responsible adult care for you for the time you are told after you leave the hospital or clinic. This is important. What happens during the procedure? You may be given a medicine to help you relax (sedative). A numbing medicine may be sprayed into the back  of your throat, or you may gargle the medicine. Your health care provider may perform the dilatation using various surgical instruments, such as: Simple dilators. This instrument is carefully placed in the esophagus to stretch it. Guided wire bougies. This involves using an endoscope to insert a wire into the esophagus. A dilator is passed over this wire to enlarge the esophagus. Then the wire is removed. Balloon dilators. An endoscope with a small balloon is inserted into the esophagus. The balloon is inflated to stretch the esophagus and open it up. The procedure may vary among health care providers and hospitals. What can I expect after the procedure? Your blood pressure, heart rate, breathing rate, and blood oxygen level will be monitored until you leave the hospital or clinic. Your throat may feel slightly sore and numb. This will get better over time. You will not be allowed to eat or drink until your throat is no longer numb. When you are able to drink, urinate, and sit on the edge of the bed without nausea or dizziness, you may be able to return home. Follow these instructions at home: Take over-the-counter and prescription medicines only as told by your health care provider. If you  were given a sedative during the procedure, it can affect you for several hours. Do not drive or operate machinery until your health care provider says that it is safe. Plan to have a responsible adult care for you for the time you are told. This is important. Follow instructions from your health care provider about any eating or drinking restrictions. Do not use any products that contain nicotine or tobacco, such as cigarettes, e-cigarettes, and chewing tobacco. If you need help quitting, ask your health care provider. Keep all follow-up visits. This is important. Contact a health care provider if: You have a fever. You have pain that is not relieved by medicine. Get help right away if: You have chest  pain. You have trouble breathing. You have trouble swallowing. You vomit blood. You have black, tarry, or bloody stools. These symptoms may represent a serious problem that is an emergency. Do not wait to see if the symptoms will go away. Get medical help right away. Call your local emergency services (911 in the [place] Do not drive yourself to the hospital. Summary Esophageal dilatation, also called esophageal dilation, is a procedure to widen or open a blocked or narrowed part of the esophagus. Plan to have a responsible adult take you home from the hospital or clinic. For this procedure, a numbing medicine may be sprayed into the back of your throat, or you may gargle the medicine. Do not drive or operate machinery until your health care provider says that it is safe. This information is not intended to replace advice given to you by your health care provider. Make sure you discuss any questions you have with your health care provider. Document Revised: 08/13/2019 Document Reviewed: 08/13/2019 Elsevier Patient Education  [place] After This sheet gives you information about how to care for yourself after your procedure. Your health care provider may also give you more specific instructions. If you have problems or questions, contact your health care provider. What can I expect after the procedure? After the procedure, it is common to have: Tiredness. Forgetfulness about what happened after the procedure. Impaired judgment for important decisions. Nausea or vomiting. Some difficulty with balance. Follow these instructions at home: For the time period you were told by your health care provider:   Rest as needed. Do not participate in activities where you could fall or become injured. Do not drive or use machinery. Do not drink alcohol. Do not take sleeping pills or medicines that cause drowsiness. Do not make important decisions or sign legal  documents. Do not take care of children on your own. Eating and drinking Follow the diet that is recommended by your health care provider. Drink enough fluid to keep your urine pale yellow. If you vomit: Drink water, juice, or soup when you can drink without vomiting. Make sure you have little or no nausea before eating solid foods. General instructions Have a responsible adult stay with you for the time you are told. It is important to have someone help care for you until you are awake and alert. Take over-the-counter and prescription medicines only as told by your health care provider. If you have sleep apnea, surgery and certain medicines can increase your risk for breathing problems. Follow instructions from your health care provider about wearing your sleep device: Anytime you are sleeping, including during daytime naps. While taking prescription pain medicines, sleeping medicines, or medicines that make you drowsy. Avoid smoking. Keep all follow-up visits as told by  your health care provider. This is important. Contact a health care provider if: You keep feeling nauseous or you keep vomiting. You feel light-headed. You are still sleepy or having trouble with balance after 24 hours. You develop a rash. You have a fever. You have redness or swelling around the IV site. Get help right away if: You have trouble breathing. You have new-onset confusion at home. Summary For several hours after your procedure, you may feel tired. You may also be forgetful and have poor judgment. Have a responsible adult stay with you for the time you are told. It is important to have someone help care for you until you are awake and alert. Rest as told. Do not drive or operate machinery. Do not drink alcohol or take sleeping pills. Get help right away if you have trouble breathing, or if you suddenly become confused. This information is not intended to replace advice given to you by your health care  provider. Make sure you discuss any questions you have with your health care provider. Document Revised: 12/11/2019 Document Reviewed: 02/27/2019 Elsevier Patient Education  2022 [employer].

## 2021-02-14 ENCOUNTER — Encounter (HOSPITAL_COMMUNITY)
Admission: RE | Admit: 2021-02-14 | Discharge: 2021-02-14 | Disposition: A | Discharge status: Home / Self Care | Payer: Medicaid Other | Source: Ambulatory Visit | Attending: Internal Medicine | Admitting: Internal Medicine

## 2021-02-14 ENCOUNTER — Other Ambulatory Visit: Payer: Self-pay

## 2021-02-14 VITALS — BP 122/64 | HR 87 | Temp 97.7°F | Resp 18 | Ht 63.0 in | Wt 153.0 lb

## 2021-02-14 DIAGNOSIS — Z01812 Encounter for preprocedural laboratory examination: Secondary | ICD-10-CM | POA: Diagnosis present

## 2021-02-14 DIAGNOSIS — Z8639 Personal history of other endocrine, nutritional and metabolic disease: Secondary | ICD-10-CM | POA: Insufficient documentation

## 2021-02-14 LAB — BASIC METABOLIC PANEL
Anion gap: 9 (ref 5–15)
BUN: 11 mg/dL (ref 6–20)
CO2: 27 mmol/L (ref 22–32)
Calcium: 8.8 mg/dL — ABNORMAL LOW (ref 8.9–10.3)
Chloride: 100 mmol/L (ref 98–111)
Creatinine, Ser: 0.91 mg/dL (ref 0.44–1.00)
GFR, Estimated: >60 mL/min (ref >60)
Glucose, Bld: 88 mg/dL (ref 70–99)
Potassium: 3.2 mmol/L — ABNORMAL LOW (ref 3.5–5.1)
Sodium: 136 mmol/L (ref 135–145)

## 2021-02-17 ENCOUNTER — Ambulatory Visit (HOSPITAL_COMMUNITY)
Admission: RE | Admit: 2021-02-17 | Discharge: 2021-02-17 | Disposition: A | Discharge status: Home / Self Care | Payer: Medicaid Other | Source: Ambulatory Visit | Attending: Internal Medicine | Admitting: Internal Medicine

## 2021-02-17 ENCOUNTER — Ambulatory Visit (HOSPITAL_COMMUNITY): Payer: Medicaid Other | Admitting: Anesthesiology

## 2021-02-17 ENCOUNTER — Encounter (HOSPITAL_COMMUNITY)
Admission: RE | Disposition: A | Discharge status: Home / Self Care | Payer: Self-pay | Source: Ambulatory Visit | Attending: Internal Medicine

## 2021-02-17 DIAGNOSIS — M1990 Unspecified osteoarthritis, unspecified site: Secondary | ICD-10-CM | POA: Diagnosis not present

## 2021-02-17 DIAGNOSIS — I2510 Atherosclerotic heart disease of native coronary artery without angina pectoris: Secondary | ICD-10-CM | POA: Diagnosis not present

## 2021-02-17 DIAGNOSIS — I509 Heart failure, unspecified: Secondary | ICD-10-CM | POA: Diagnosis not present

## 2021-02-17 DIAGNOSIS — I110 Hypertensive heart disease with heart failure: Secondary | ICD-10-CM | POA: Insufficient documentation

## 2021-02-17 DIAGNOSIS — R1314 Dysphagia, pharyngoesophageal phase: Secondary | ICD-10-CM | POA: Diagnosis not present

## 2021-02-17 DIAGNOSIS — K219 Gastro-esophageal reflux disease without esophagitis: Secondary | ICD-10-CM | POA: Insufficient documentation

## 2021-02-17 DIAGNOSIS — Z79899 Other long term (current) drug therapy: Secondary | ICD-10-CM | POA: Diagnosis not present

## 2021-02-17 DIAGNOSIS — R1310 Dysphagia, unspecified: Secondary | ICD-10-CM

## 2021-02-17 DIAGNOSIS — J45909 Unspecified asthma, uncomplicated: Secondary | ICD-10-CM | POA: Insufficient documentation

## 2021-02-17 DIAGNOSIS — Z87891 Personal history of nicotine dependence: Secondary | ICD-10-CM | POA: Diagnosis not present

## 2021-02-17 HISTORY — PX: BIOPSY: SHX5522

## 2021-02-17 HISTORY — PX: ESOPHAGOGASTRODUODENOSCOPY (EGD) WITH PROPOFOL: SHX5813

## 2021-02-17 HISTORY — PX: MALONEY DILATION: SHX5535

## 2021-02-17 SURGERY — ESOPHAGOGASTRODUODENOSCOPY (EGD) WITH PROPOFOL
Anesthesia: General

## 2021-02-17 MED ORDER — PROPOFOL 10 MG/ML IV BOLUS
INTRAVENOUS | Status: DC | PRN
  Administered 2021-02-17: 100 mg via INTRAVENOUS

## 2021-02-17 MED ORDER — LACTATED RINGERS IV SOLN: INTRAVENOUS | Status: DC

## 2021-02-17 MED ORDER — PROPOFOL 500 MG/50ML IV EMUL
INTRAVENOUS | Status: DC | PRN
  Administered 2021-02-17: 150 ug/kg/min via INTRAVENOUS

## 2021-02-17 NOTE — Discharge Instructions (Addendum)
EGD Discharge instructions Please read the instructions outlined below and refer to this sheet in the next few weeks. These discharge instructions provide you with general information on caring for yourself after you leave the hospital. Your doctor may also give you specific instructions. While your treatment has been planned according to the most current medical practices available, unavoidable complications occasionally occur. If you have any problems or questions after discharge, please call your doctor. ACTIVITY You may resume your regular activity but move at a slower pace for the next 24 hours.  Take frequent rest periods for the next 24 hours.  Walking will help expel (get rid of) the air and reduce the bloated feeling in your abdomen.  No driving for 24 hours (because of the anesthesia (medicine) used during the test).  You may shower.  Do not sign any important legal documents or operate any machinery for 24 hours (because of the anesthesia used during the test).  NUTRITION Drink plenty of fluids.  You may resume your normal diet.  Begin with a light meal and progress to your normal diet.  Avoid alcoholic beverages for 24 hours or as instructed by your caregiver.  MEDICATIONS You may resume your normal medications unless your caregiver tells you otherwise.  WHAT YOU CAN EXPECT TODAY You may experience abdominal discomfort such as a feeling of fullness or "gas" pains.  FOLLOW-UP Your doctor will discuss the results of your test with you.  SEEK IMMEDIATE MEDICAL ATTENTION IF ANY OF THE FOLLOWING OCCUR: Excessive nausea (feeling sick to your stomach) and/or vomiting.  Severe abdominal pain and distention (swelling).  Trouble swallowing.  Temperature over 101 F (37.8 C).  Rectal bleeding or vomiting of blood.    Your esophagus was stretched today  As discussed, I took some samples of your esophagus as well  You may have a sore throat for the next couple of days.  If you do, use  Chloraseptic spray.  It will go away.  Continue Dexilant 60 mg daily  Office visit with [place] in 6 months  Further recommendations to follow pending review of pathology report  At patient request, I called Halsey Persaud at [phone] -rolled to voicemail.  Left a message.

## 2021-02-17 NOTE — Anesthesia Preprocedure Evaluation (Signed)
Anesthesia Evaluation  Patient identified by MRN, date of birth, ID band Patient awake    Reviewed: Allergy & Precautions, NPO status , Patient's Chart, lab work & pertinent test results, reviewed documented beta blocker date and time   History of Anesthesia Complications Negative for: history of anesthetic complications  Airway Mallampati: II  TM Distance: >3 FB Neck ROM: Full   Comment: Cervical spine sx Dental  (+) Dental Advisory Given, Teeth Intact   Pulmonary asthma , former smoker,    Pulmonary exam normal breath sounds clear to auscultation       Cardiovascular Exercise Tolerance: Good hypertension, Pt. on home beta blockers and Pt. on medications + CAD and +CHF  Normal cardiovascular exam Rhythm:Regular Rate:Normal  a. 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 normalized by repeat imaging and at 55-60% in 04/2019, 1. Left ventricular ejection fraction, by estimation, is 60 to 65%. The left ventricle has normal function. The left ventricle has no regional wall motion abnormalities. There is moderate left ventricular hypertrophy.  Left ventricular diastolic parameters are indeterminate.  2. Right ventricular systolic function is normal. The right ventricular size is normal. Tricuspid regurgitation signal is inadequate for assessing PA pressure.  3. Left atrial size was upper normal.  4. There is a trivial pericardial effusion posterior to the left  ventricle.  5. The mitral valve is abnormal, mildly thickened with somewhat restricted motion. Mild to moderate mitral valve regurgitation.  6. The aortic valve is tricuspid. Aortic valve regurgitation is mild to moderate. Aortic regurgitation PHT measures 339 msec. Aortic valve mean gradient measures 9.0 mmHg.  7. The inferior vena cava is normal in size with greater than 50% respiratory variability, suggesting right atrial pressure of 3 mmHg.    Neuro/Psych negative neurological ROS  negative psych ROS   GI/Hepatic Neg liver ROS, GERD  Medicated,  Endo/Other  negative endocrine ROS  Renal/GU negative Renal ROS     Musculoskeletal  (+) Arthritis , Osteoarthritis,    Abdominal   Peds  Hematology negative hematology ROS (+)   Anesthesia Other Findings Cervical spine sx  Reproductive/Obstetrics                          Anesthesia Physical Anesthesia Plan  ASA: 2  Anesthesia Plan: General   Post-op Pain Management:    Induction: Intravenous  PONV Risk Score and Plan: TIVA  Airway Management Planned: Nasal Cannula and Natural Airway  Additional Equipment:   Intra-op Plan:   Post-operative Plan:   Informed Consent: I have reviewed the patients History and Physical, chart, labs and discussed the procedure including the risks, benefits and alternatives for the proposed anesthesia with the patient or authorized representative who has indicated his/her understanding and acceptance.     Dental advisory given  Plan Discussed with: CRNA and Surgeon  Anesthesia Plan Comments:        Anesthesia Quick Evaluation

## 2021-02-17 NOTE — Anesthesia Postprocedure Evaluation (Signed)
Anesthesia Post Note  Patient: ANJULIE DIPIERRO  Procedure(s) Performed: ESOPHAGOGASTRODUODENOSCOPY (EGD) WITH PROPOFOL [place]  Patient location during evaluation: Phase II Anesthesia Type: General Level of consciousness: awake and alert and oriented Pain management: pain level controlled Vital Signs Assessment: post-procedure vital signs reviewed and stable Respiratory status: spontaneous breathing, nonlabored ventilation and respiratory function stable Cardiovascular status: blood pressure returned to baseline and stable Postop Assessment: no apparent nausea or vomiting Anesthetic complications: no   No notable events documented.   Last Vitals:  Vitals:   02/17/21 1045 02/17/21 1448  BP: 122/63 114/61  Pulse:  89  Resp: 15 (!) 22  Temp: 36.8 C 36.9 C  SpO2: 100% 100%    Last Pain:  Vitals:   02/17/21 1448  TempSrc: Oral  PainSc: 0-No pain                 Darlyn Repsher C Jamilya Sarrazin

## 2021-02-17 NOTE — Transfer of Care (Signed)
Immediate Anesthesia Transfer of Care Note  Patient: Destiny Manning  Procedure(s) Performed: ESOPHAGOGASTRODUODENOSCOPY (EGD) WITH PROPOFOL [place]  Patient Location: PACU  Anesthesia Type:General  Level of Consciousness: awake, alert , oriented and patient cooperative  Airway & Oxygen Therapy: Patient Spontanous Breathing  Post-op Assessment: Report given to RN, Post -op Vital signs reviewed and stable and Patient moving all extremities X 4  Post vital signs: Reviewed and stable  Last Vitals:  Vitals Value Taken Time  BP    Temp    Pulse    Resp    SpO2      Last Pain: There were no vitals filed for this visit.       Complications: No notable events documented.

## 2021-02-17 NOTE — Op Note (Signed)
[facility] Patient Name: Destiny Manning Procedure Date: 02/17/2021 2:24 PM MRN: [id number] Date of Birth: [birth year] Attending MD: Norvel Richards , MD CSN: [id number] Age: 50 Admit Type: Outpatient Procedure:                Upper GI endoscopy Indications:              Dysphagia Providers:                Norvel Richards, MD, Caprice Kluver, Randa Spike, Technician Referring MD:              Medicines:                Propofol per Anesthesia Complications:            No immediate complications. Estimated Blood Loss:     Estimated blood loss was minimal. Procedure:                Pre-Anesthesia Assessment:                           - Prior to the procedure, a History and Physical                            was performed, and patient medications and                            allergies were reviewed. The patient's tolerance of                            previous anesthesia was also reviewed. The risks                            and benefits of the procedure and the sedation                            options and risks were discussed with the patient.                            All questions were answered, and informed consent                            was obtained. Prior Anticoagulants: The patient has                            taken no previous anticoagulant or antiplatelet                            agents. ASA Grade Assessment: III - A patient with                            severe systemic disease. After reviewing the risks  and benefits, the patient was deemed in                            satisfactory condition to undergo the procedure.                           After obtaining informed consent, the endoscope was                            passed under direct vision. Throughout the                            procedure, the patient's blood pressure, pulse, and                            oxygen saturations were  monitored continuously. The                            GIF-H190 ([id number]) scope was introduced through the                            mouth, and advanced to the second part of duodenum.                            The upper GI endoscopy was accomplished without                            difficulty. The patient tolerated the procedure                            well. Scope In: 2:36:38 PM Scope Out: 2:43:21 PM Total Procedure Duration: 0 hours 6 minutes 43 seconds  Findings:      The examined esophagus was normal.      The entire examined stomach was normal.      The duodenal bulb and second portion of the duodenum were normal. The       scope was withdrawn. Dilation was performed with a Maloney dilator with       mild resistance at 70 Fr. The dilation site was examined following       endoscope reinsertion and showed no change. Estimated blood loss was       minimal. Finally, biopsies of the mid distal esophagus taken for       histologic study. Impression:               - Normal esophagus. Dilated. Status post esophageal                            biopsy                           - Normal stomach.                           - Normal duodenal bulb and second portion of the  duodenum.                           - Moderate Sedation:      Moderate (conscious) sedation was personally administered by an       anesthesia professional. The following parameters were monitored: oxygen       saturation, heart rate, blood pressure, respiratory rate, EKG, adequacy       of pulmonary ventilation, and response to care. Recommendation:           - Patient has a contact number available for                            emergencies. The signs and symptoms of potential                            delayed complications were discussed with the                            patient. Return to normal activities tomorrow.                            Written discharge instructions were  provided to the                            patient.                           - Resume previous diet.                           - Continue present medications. Use Chloraseptic                            for any transient sore throat. Continue Dexilant 60                            mg daily. Follow-up on pathology                           - Return to my office in 6 months. Procedure Code(s):        --- Professional ---                           [phone], Esophagogastroduodenoscopy, flexible,                            transoral; diagnostic, including collection of                            specimen(s) by brushing or washing, when performed                            (separate procedure)                           25053, Dilation of esophagus, by unguided sound or  bougie, single or multiple passes Diagnosis Code(s):        --- Professional ---                           R13.10, Dysphagia, unspecified CPT copyright 2019 American Medical Association. All rights reserved. The codes documented in this report are preliminary and upon coder review may  be revised to meet current compliance requirements. Cristopher Estimable. Linc Renne, MD Norvel Richards, MD 02/17/2021 2:54:14 PM This report has been signed electronically. Number of Addenda: 0

## 2021-02-17 NOTE — H&P (Signed)
@LOGO @   Primary Care Physician:  Noreene Larsson, NP Primary Gastroenterologist:  Dr. Gala Romney  Pre-Procedure History & Physical: HPI:  Destiny Manning is a 50 y.o. female here for further management of esophageal dysphagia via EGD.  History of esophageal dysphagia with no structural lesion found previously. Has responded to [facility] dilation in the past.  Dexilant 60 mg daily controlling her reflux symptoms very well  Past Medical History:  Diagnosis Date   Asthma dx 06/04/07   ventolin, singulair   Blood transfusion without reported diagnosis    CHF (congestive heart failure) ([place]) 03/19/2008   a. 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 normalized by repeat imaging and at 55-60% in 04/2019,   DCM (dilated cardiomyopathy) ([place]) dx 06/06/07   EF 10-15%   Degeneration of spine    DJD (degenerative joint disease), lumbar 11/16/2016   chronic lumbar pain   GERD (gastroesophageal reflux disease)    takes Nexium Daily   Goiter 09/09/2020   Hypertension    Seasonal allergies    takes zyrtec    Past Surgical History:  Procedure Laterality Date   ABDOMINAL HYSTERECTOMY     fibroids; partial- ovaries intact   CERVICAL SPINE SURGERY  2002   [facility], ruptured disc   CHOLECYSTECTOMY  2001   laparoscopic, [facility]   COLONOSCOPY WITH PROPOFOL N/A 07/12/2017   tubular adenoma (10 mm polyp in cecum), segmental biopsies negative. 5 year surveillance   ESOPHAGOGASTRODUODENOSCOPY (EGD) WITH ESOPHAGEAL DILATION N/A 09/05/2012   ALP:FXTKW hiatal hernia s/p esophageal dilation with [address].    ESOPHAGOGASTRODUODENOSCOPY (EGD) WITH PROPOFOL N/A 07/12/2017   normal esophagus s/p dilation, normal duodenal bulb and second portion of duodenum   LAPAROSCOPIC BILATERAL SALPINGO OOPHERECTOMY Bilateral 12/15/2020   Procedure: LAPAROSCOPIC BILATERAL SALPINGO OOPHORECTOMY;  Surgeon: [name], MD;  Location: [facility] ORS;  Service: Gynecology;  Laterality: Bilateral;   MALONEY DILATION N/A 07/12/2017    Procedure: [name] DILATION;  Surgeon: [name], MD;  Location: [facility] ENDO SUITE;  Service: Endoscopy;  Laterality: N/A;   POLYPECTOMY  07/12/2017   Procedure: POLYPECTOMY;  Surgeon: [name], MD;  Location: [facility] ENDO SUITE;  Service: Endoscopy;;  Cecal polyp (HS)   SPINE SURGERY     TUBAL LIGATION     VAGINAL HYSTERECTOMY  10/19/2010   Procedure: HYSTERECTOMY VAGINAL;  Surgeon: [name], MD;  Location: [facility] ORS;  Service: Gynecology;  Laterality: N/A;    Prior to Admission medications   Medication Sig Start Date End Date Taking? Authorizing Provider  albuterol (VENTOLIN HFA) 108 (90 Base) MCG/ACT inhaler Inhale 2 puffs into the lungs every 6 (six) hours as needed for wheezing or shortness of breath.   Yes [provider]  atorvastatin (LIPITOR) 10 MG tablet Take 1 tablet (10 mg total) by mouth daily. Patient taking differently: Take 10 mg by mouth at bedtime. 08/03/20  Yes Strader, [place] M, PA-C  baclofen (LIORESAL) 10 MG tablet Take 10 mg by mouth 3 (three) times daily as needed for muscle spasms.   Yes [provider]  carvedilol (COREG) 25 MG tablet Take 1 tablet (25 mg total) by mouth 2 (two) times daily. 08/03/20  Yes Strader, [place] M, PA-C  cetirizine (ZYRTEC) 10 MG tablet Take 1 tablet (10 mg total) by mouth daily. 06/22/17  Yes Raylene Everts, MD  chlorthalidone (HYGROTON) 25 MG tablet Take 0.5 tablets (12.5 mg total) by mouth daily. 08/03/20 07/29/21 Yes Strader, Fransisco Hertz, PA-C  dexlansoprazole [facility])  60 MG capsule Take 1 capsule (60 mg total) by mouth daily. 01/11/21 01/06/22 Yes Jamont Mellin, Cristopher Estimable, MD  estradiol (ESTRACE) 2 MG tablet Take 1 tablet (2 mg total) by mouth daily. 12/27/20  Yes [name], MD  fluticasone (FLONASE) 50 MCG/ACT nasal spray Place 1 spray daily into both nostrils. Patient taking differently: Place 1 spray into both nostrils 2 (two) times daily as needed for allergies. 02/12/17  Yes Raylene Everts, MD   Fluticasone-Salmeterol (ADVAIR) 500-50 MCG/DOSE AEPB Inhale 1 puff into the lungs 2 (two) times daily as needed (respiratory issues.).   Yes [provider]  gabapentin (NEURONTIN) 800 MG tablet Take 800 mg by mouth 3 (three) times daily. 08/11/20  Yes [provider]  ketorolac (TORADOL) 10 MG tablet Take 1 tablet (10 mg total) by mouth every 8 (eight) hours as needed. 12/27/20  Yes [name], MD  losartan (COZAAR) 100 MG tablet Take 1 tablet (100 mg total) by mouth daily. 08/03/20  Yes Strader, [place] M, PA-C  lubiprostone (AMITIZA) 8 MCG capsule TAKE (1) CAPSULE BY MOUTH TWICE DAILY. 01/11/21  Yes Sylvie Mifsud, Cristopher Estimable, MD  montelukast (SINGULAIR) 10 MG tablet Take 10 mg by mouth at bedtime.   Yes [provider]  ondansetron (ZOFRAN ODT) 8 MG disintegrating tablet Take 1 tablet (8 mg total) by mouth every 8 (eight) hours as needed for nausea or vomiting. 12/15/20  Yes [name], MD  potassium chloride SA (KLOR-CON) 20 MEQ tablet Take 1 tablet (20 mEq total) by mouth 3 (three) times daily. Patient taking differently: Take 20 mEq by mouth daily as needed (when instructed by MD). 12/10/20  Yes [name], MD  SUMAtriptan (IMITREX) 50 MG tablet Take 50 mg by mouth 2 (two) times daily as needed for migraine. May repeat in 2 hours if headache persists or recurs.   Yes [provider]    Allergies as of 01/21/2021   (No Known Allergies)    Family History  Problem Relation Age of Onset   Diabetes Mother    Hypertension Mother    Congestive Heart Failure Father 60   Arthritis Father    Dementia Maternal Grandmother    Congestive Heart Failure Paternal Grandmother    Congestive Heart Failure Paternal Grandfather    Cancer Maternal Uncle    Anesthesia problems Neg Hx    Hypotension Neg Hx    Pseudochol deficiency Neg Hx    Malignant hyperthermia Neg Hx    Colon cancer Neg Hx    Colon polyps Neg Hx     Social History   Socioeconomic History    Marital status: Single    Spouse name: Not on file   Number of children: 2   Years of education: 16   Highest education level: Not on file  Occupational History   Occupation: [employer]    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  Tobacco Use   Smoking status: Former    Packs/day: 0.25    Years: 4.00    Pack years: 1.00    Types: Cigarettes    Quit date: 03/14/2009    Years since quitting: 11.9   Smokeless tobacco: Never  Vaping Use   Vaping Use: Never used  Substance and Sexual Activity   Alcohol use: No   Drug use: No   Sexual activity: Not Currently    Birth control/protection: Surgical    Comment: hyst  Other Topics Concern   Not on file  Social History Narrative  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[employer]; 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      Lives with 2 children- age 59 and 22; she homeschools them   Social Determinants of Health   Financial Resource Strain: High Risk   Difficulty of Paying Living Expenses: Hard  Food Insecurity: No Food Insecurity   Worried About Charity fundraiser in the Last Year: Never true   Arboriculturist in the Last Year: Never true  Transportation Needs: No Transportation Needs   Lack of Transportation (Medical): No   Lack of Transportation (Non-Medical): No  Physical Activity: Insufficiently Active   Days of Exercise per Week: 1 day   Minutes of Exercise per Session: 20 min  Stress: No Stress Concern Present   Feeling of Stress : Not at all  Social Connections: Socially Isolated   Frequency of Communication with Friends and Family: Once a week   Frequency of Social Gatherings with Friends and Family: Never   Attends Religious Services: More than 4 times per year   Active Member of [employer] or Organizations: No   Attends Music therapist: Never   Marital Status: Never married  Human resources officer Violence: Not At Risk   Fear of Current or Ex-Partner: No   Emotionally Abused: No   Physically Abused: No   Sexually Abused: No     Review of Systems: See HPI, otherwise negative ROS  Physical Exam: BP 122/63 (BP Location: Right Arm)   Temp 98.2 F (36.8 C)   Resp 15   LMP 07/04/2010   SpO2 100%  General:   Alert,  Well-developed, well-nourished, pleasant and cooperative in NAD Neck:  Supple; no masses or thyromegaly. No significant cervical adenopathy. Lungs:  Clear throughout to auscultation.   No wheezes, crackles, or rhonchi. No acute distress. Heart:  Regular rate and rhythm; no murmurs, clicks, rubs,  or gallops. Abdomen: Non-distended, normal bowel sounds.  Soft and nontender without appreciable mass or hepatosplenomegaly.  Pulses:  Normal pulses noted. Extremities:  Without clubbing or edema.  Impression/Plan: 50 year old lady with chronic GERD and recurrent esophageal dysphagia.  I have offered the patient a EGD with esophageal dilation, etc. per plan.  The risks, benefits, limitations, alternatives and imponderables have been reviewed with the patient. Potential for esophageal dilation, biopsy, etc. have also been reviewed.  Questions have been answered. All parties agreeable.      Notice: This dictation was prepared with Dragon dictation along with smaller phrase technology. Any transcriptional errors that result from this process are unintentional and may not be corrected upon review.

## 2021-02-21 ENCOUNTER — Encounter: Payer: Self-pay | Admitting: Internal Medicine

## 2021-02-21 LAB — SURGICAL PATHOLOGY

## 2021-02-23 ENCOUNTER — Encounter (HOSPITAL_COMMUNITY): Payer: Self-pay | Admitting: Internal Medicine

## 2021-05-19 ENCOUNTER — Encounter: Payer: Self-pay | Admitting: Nurse Practitioner

## 2021-05-19 ENCOUNTER — Other Ambulatory Visit: Payer: Self-pay

## 2021-05-19 ENCOUNTER — Ambulatory Visit: Payer: Medicaid Other | Admitting: Nurse Practitioner

## 2021-05-19 VITALS — BP 110/72 | HR 86 | Ht 63.0 in | Wt 161.1 lb

## 2021-05-19 DIAGNOSIS — I2510 Atherosclerotic heart disease of native coronary artery without angina pectoris: Secondary | ICD-10-CM | POA: Diagnosis not present

## 2021-05-19 DIAGNOSIS — I10 Essential (primary) hypertension: Secondary | ICD-10-CM | POA: Diagnosis not present

## 2021-05-19 DIAGNOSIS — M25511 Pain in right shoulder: Secondary | ICD-10-CM

## 2021-05-19 DIAGNOSIS — G8929 Other chronic pain: Secondary | ICD-10-CM | POA: Diagnosis not present

## 2021-05-19 MED ORDER — IBUPROFEN 600 MG PO TABS: 600.0000 mg | ORAL_TABLET | Freq: Three times a day (TID) | ORAL | 0 refills | Status: DC | PRN

## 2021-05-19 NOTE — Assessment & Plan Note (Signed)
DASH diet and commitment to daily physical activity for a minimum of 30 minutes discussed and encouraged, as a part of hypertension management. The importance of attaining a healthy weight is also discussed.  BP/Weight 05/19/2021 02/17/2021 02/14/2021 01/11/2021 12/27/2020 11/08/2749 7/0/0174  Systolic BP 944 967 591 638 - 466 599  Diastolic BP 72 61 64 72 - 69 72  Wt. (Lbs) 161.08 - 153 158.8 - - 141  BMI 28.53 - 27.1 28.13 24.98 - 24.98   continue current meds

## 2021-05-19 NOTE — Assessment & Plan Note (Signed)
She has otho but does not want to go back to them  She is taking baclofen and gabapentin but they have not been effective Pt would like a stroger pain med, pt referred to pain mgt.  RX ibuprofen 600mg  TID PRN. Continue baclofen and gabapentin

## 2021-05-19 NOTE — Assessment & Plan Note (Signed)
followed by cardiology , takes atorvastatin 10mg  daily  Lipid panel ordered today

## 2021-05-19 NOTE — Progress Notes (Signed)
° °  Destiny Manning     MRN: [id number]      DOB: 11/14/1970   HPI Ms. Destiny Manning is here for c/o pain.  Dr Luna Glasgow ordered MRI of her  back, she was told she had some arthritis.  she has been dealing with ongoing pain, muscle spasm, sharpness in her neck and back. She has pain in her right shoulder for 2 months. Massage therapy helps a whole lot. Her insurance does not pay for massage therapy but she can not afford the pay. She has done PT, it was not helpful. Gabapentin and baclofen not helping, she wants to know if she can get something stronger. Its hard for her to get out of bed in the morning. Due to her pain. She has been having this problems for over 20 years.   ROS Denies recent fever or chills. Denies sinus pressure, nasal congestion, ear pain or sore throat. Denies chest congestion, productive cough or wheezing. Denies chest pains, palpitations and leg swelling Denies abdominal pain, nausea, vomiting,diarrhea or constipation.   Denies dysuria, frequency, hesitancy or incontinence. Has chronic right shoulder joint pain, and limitation in mobility. Denies headaches, seizures, numbness, or tingling. Denies depression, anxiety or insomnia. Denies skin break down or rash.   PE  BP 110/72 (BP Location: Left Arm, Patient Position: Sitting, Cuff Size: Normal)    Pulse 86    Ht 5\' 3"  (1.6 m)    Wt 161 lb 1.3 oz (73.1 kg)    LMP 07/04/2010    SpO2 96%    BMI 28.53 kg/m   Patient alert and oriented and in no cardiopulmonary distress.    Chest: Clear to auscultation bilaterally.  CVS: S1, S2 no murmurs, no S3.Regular rate.  ABD: Soft non tender.   Ext: No edema  MS: Adequate ROM spine, hips and knees. limited ROM right shoulder verbalized  tenderness on ROM of right shoulder.  Skin: Intact, no ulcerations or rash noted.  Psych: Good eye contact, normal affect. Memory intact not anxious or depressed appearing.  CNS: CN 2-12 intact, power,  normal throughout.no focal deficits  noted.   Assessment & Plan

## 2021-05-19 NOTE — Patient Instructions (Signed)
Please get your labs done tomorrow.  It is important that you exercise regularly at least 30 minutes 5 times a week.  Think about what you will eat, plan ahead. Choose " clean, green, fresh or frozen" over canned, processed or packaged foods which are more sugary, salty and fatty. 70 to 75% of food eaten should be vegetables and fruit. Three meals at set times with snacks allowed between meals, but they must be fruit or vegetables. Aim to eat over a 12 hour period , example 7 am to 7 pm, and STOP after  your last meal of the day. Drink water,generally about 64 ounces per day, no other drink is as healthy. Fruit juice is best enjoyed in a healthy way, by EATING the fruit.  Thanks for choosing [facility], we consider it a privelige to serve you.

## 2021-05-21 LAB — CMP14+EGFR
ALT: 5 IU/L (ref 0–32)
AST: 15 IU/L (ref 0–40)
Albumin/Globulin Ratio: 1.5 (ref 1.2–2.2)
Albumin: 4.2 g/dL (ref 3.8–4.8)
Alkaline Phosphatase: 76 IU/L (ref 44–121)
BUN/Creatinine Ratio: 9 (ref 9–23)
BUN: 9 mg/dL (ref 6–24)
Bilirubin Total: 0.5 mg/dL (ref 0.0–1.2)
CO2: 27 mmol/L (ref 20–29)
Calcium: 9.3 mg/dL (ref 8.7–10.2)
Chloride: 98 mmol/L (ref 96–106)
Creatinine, Ser: 1 mg/dL (ref 0.57–1.00)
Globulin, Total: 2.8 g/dL (ref 1.5–4.5)
Glucose: 92 mg/dL (ref 70–99)
Potassium: 3.5 mmol/L (ref 3.5–5.2)
Sodium: 138 mmol/L (ref 134–144)
Total Protein: 7 g/dL (ref 6.0–8.5)
eGFR: 69 mL/min/{1.73_m2} (ref >59)

## 2021-05-21 LAB — LIPID PANEL
Chol/HDL Ratio: 3.8 ratio (ref 0.0–4.4)
Cholesterol, Total: 169 mg/dL (ref 100–199)
HDL: 45 mg/dL (ref >39)
LDL Chol Calc (NIH): 98 mg/dL (ref 0–99)
Triglycerides: 145 mg/dL (ref 0–149)
VLDL Cholesterol Cal: 26 mg/dL (ref 5–40)

## 2021-05-21 NOTE — Progress Notes (Signed)
Labs are normal.

## 2021-06-07 ENCOUNTER — Other Ambulatory Visit: Payer: Self-pay | Admitting: Nurse Practitioner

## 2021-06-07 DIAGNOSIS — G8929 Other chronic pain: Secondary | ICD-10-CM

## 2021-06-07 MED ORDER — GABAPENTIN 800 MG PO TABS: 800.0000 mg | ORAL_TABLET | Freq: Three times a day (TID) | ORAL | 1 refills | Status: DC

## 2021-06-09 ENCOUNTER — Other Ambulatory Visit: Payer: Self-pay | Admitting: Nurse Practitioner

## 2021-06-23 ENCOUNTER — Other Ambulatory Visit: Payer: Self-pay

## 2021-06-23 ENCOUNTER — Encounter: Payer: Self-pay | Admitting: Nurse Practitioner

## 2021-06-23 ENCOUNTER — Ambulatory Visit: Payer: Medicaid Other | Admitting: Nurse Practitioner

## 2021-06-23 VITALS — BP 136/79 | HR 74 | Temp 97.9°F | Ht 63.0 in | Wt 158.2 lb

## 2021-06-23 DIAGNOSIS — M545 Low back pain, unspecified: Secondary | ICD-10-CM

## 2021-06-23 DIAGNOSIS — M25571 Pain in right ankle and joints of right foot: Secondary | ICD-10-CM

## 2021-06-23 DIAGNOSIS — G43819 Other migraine, intractable, without status migrainosus: Secondary | ICD-10-CM | POA: Diagnosis not present

## 2021-06-23 DIAGNOSIS — M25572 Pain in left ankle and joints of left foot: Secondary | ICD-10-CM

## 2021-06-23 DIAGNOSIS — M25511 Pain in right shoulder: Secondary | ICD-10-CM

## 2021-06-23 DIAGNOSIS — G8929 Other chronic pain: Secondary | ICD-10-CM

## 2021-06-23 MED ORDER — IBUPROFEN 600 MG PO TABS: 600.0000 mg | ORAL_TABLET | Freq: Three times a day (TID) | ORAL | 0 refills | Status: DC | PRN

## 2021-06-23 MED ORDER — SUMATRIPTAN SUCCINATE 50 MG PO TABS: 50.0000 mg | ORAL_TABLET | Freq: Two times a day (BID) | ORAL | 2 refills | Status: DC | PRN

## 2021-06-23 NOTE — Progress Notes (Signed)
? ?Subjective:  ? ? Patient ID: Destiny Manning, female    DOB: [birth year], 51 y.o.   MRN: [id number] ? ?HPI ? ?Patient here to establish care.   ? ?51 year old female patient with history of congestive heart failure, hypertension, asthma, degenerative joint disease, and chronic shoulder pain presents to clinic today with complaints of bilateral ankle soreness x1 week.  Patient states that she always felt like her ankles were weak after having bilateral ankle sprains almost 15 years ago.  Patient states that when she would run she felt unstable.  However recently she states that she has had increasing pain to her ankles that feel like the bone is painful.  Patient states that the pain sometimes radiates up the leg.  Patient admits to occasional swelling but denies any swelling currently today.  ? ?Patient also admits to having chronic back pain, chronic shoulder pain and other joint pain.   ? ?Regarding headaches, patient states that her headaches have been a little more frequent lately.  Patient reports that over the past 2 weeks she has had a headache at least twice a week.  Patient would like a refill on Imitrex as Imitrex helps with her headaches.  ? ?Review of Systems  ?Musculoskeletal:   ?     Ankle pain  ?All other systems reviewed and are negative. ? ?   ?Objective:  ? Physical Exam ?Constitutional:   ?   General: She is not in acute distress. ?   Appearance: Normal appearance. She is normal weight. She is not ill-appearing, toxic-appearing or diaphoretic.  ?Cardiovascular:  ?   Rate and Rhythm: Normal rate and regular rhythm.  ?   Pulses: Normal pulses.  ?   Heart sounds: Normal heart sounds. No murmur heard. ?Pulmonary:  ?   Effort: Pulmonary effort is normal. No respiratory distress.  ?   Breath sounds: Normal breath sounds. No wheezing.  ?Musculoskeletal:     ?   General: Tenderness present. No swelling, deformity or signs of injury.  ?   Right lower leg: No edema.  ?   Left lower leg: No edema.  ?   Right  ankle: No swelling, deformity, ecchymosis or lacerations. Tenderness present over the lateral malleolus and medial malleolus. No ATF ligament, AITF ligament, CF ligament, posterior TF ligament, base of 5th metatarsal or proximal fibula tenderness. Normal range of motion. Anterior drawer test negative. Normal pulse.  ?   Right Achilles Tendon: Normal.  ?   Left ankle: No swelling, deformity, ecchymosis or lacerations. Tenderness present over the lateral malleolus, medial malleolus, ATF ligament, AITF ligament, CF ligament and posterior TF ligament. No base of 5th metatarsal or proximal fibula tenderness. Normal range of motion. Anterior drawer test negative. Normal pulse.  ?   Left Achilles Tendon: Normal.  ?   Comments:  Patient had increased tenderness to palpation around the ankle to her left ankle with decreased range of motion.  Strength in bilateral ankles is 5 out of 5.  No loss of stability noted in ankle during exam.  No abnormal gait noted.  ?Skin: ?   General: Skin is warm.  ?   Capillary Refill: Capillary refill takes less than 2 seconds.  ?Neurological:  ?   General: No focal deficit present.  ?   Mental Status: She is alert and oriented to person, place, and time.  ?Psychiatric:     ?   Mood and Affect: Mood normal.     ?   Behavior: Behavior normal.  ? ? ? ? ? ?   ?  Assessment & Plan:  ? ?1. Encounter to establish care ?-Patient to return to clinic in 3 months for annual physical. ? ?2. Acute bilateral ankle pain ?-Possible rheumatoid etiology.  Will rule out with CRP, ANA, and ESR lab. ?-We will also evaluate ankle via x-ray to identify any degenerative changes or arthritic changes. ?- DG Ankle Complete Right ?- DG Ankle Complete Left ?- C-reactive protein ?- Sed Rate (ESR) ?-If CRP, ANA, ESR positive will refer to rheumatology.  However if they are negative we will proceed with physical therapy depending on results of ankle x-rays. ?-Follow-up in 3 months ? ?3. Other migraine without status  migrainosus, intractable ?-May continue with Imitrex. ?- SUMAtriptan (IMITREX) 50 MG tablet; Take 1 tablet (50 mg total) by mouth 2 (two) times daily as needed for migraine. May repeat in 2 hours if headache persists or recurs.  Dispense: 20 tablet; Refill: 2 ?-If headaches persist will consider changing to topiramate. ?-Follow-up in 3 months ? ?4. Chronic right shoulder pain ?-Pain management referral placed. ?May continue taking ibuprofen as needed. ?- ibuprofen (ADVIL) 600 MG tablet; Take 1 tablet (600 mg total) by mouth every 8 (eight) hours as needed.  Dispense: 30 tablet; Refill: 0 ?-Follow-up in 3 months ? ?5. Chronic midline low back pain without sciatica ?-May take ibuprofen as needed. ?- Ambulatory referral to Pain Clinic ?-Follow-up in 3 months ? ?  ?Note:  This document was prepared using Dragon voice recognition software and may include unintentional dictation errors. ? ? ?

## 2021-06-24 ENCOUNTER — Ambulatory Visit (HOSPITAL_COMMUNITY)
Admission: RE | Admit: 2021-06-24 | Discharge: 2021-06-24 | Disposition: A | Discharge status: Home / Self Care | Payer: Medicaid Other | Source: Ambulatory Visit | Attending: Nurse Practitioner | Admitting: Nurse Practitioner

## 2021-06-24 DIAGNOSIS — M25571 Pain in right ankle and joints of right foot: Secondary | ICD-10-CM | POA: Diagnosis present

## 2021-06-24 DIAGNOSIS — M25572 Pain in left ankle and joints of left foot: Secondary | ICD-10-CM | POA: Diagnosis present

## 2021-06-25 LAB — C-REACTIVE PROTEIN: CRP: 7 mg/L (ref 0–10)

## 2021-06-25 LAB — SEDIMENTATION RATE: Sed Rate: 36 mm/hr — ABNORMAL HIGH (ref 0–32)

## 2021-06-27 ENCOUNTER — Encounter: Payer: Self-pay | Admitting: Nurse Practitioner

## 2021-06-27 ENCOUNTER — Other Ambulatory Visit (HOSPITAL_COMMUNITY): Payer: Self-pay | Admitting: Nurse Practitioner

## 2021-06-27 ENCOUNTER — Other Ambulatory Visit: Payer: Self-pay | Admitting: Nurse Practitioner

## 2021-06-27 DIAGNOSIS — Z1231 Encounter for screening mammogram for malignant neoplasm of breast: Secondary | ICD-10-CM

## 2021-06-27 DIAGNOSIS — M25571 Pain in right ankle and joints of right foot: Secondary | ICD-10-CM

## 2021-06-28 ENCOUNTER — Other Ambulatory Visit: Payer: Self-pay | Admitting: Nurse Practitioner

## 2021-06-28 DIAGNOSIS — M25571 Pain in right ankle and joints of right foot: Secondary | ICD-10-CM

## 2021-06-29 LAB — ANA: ANA Titer 1: NEGATIVE

## 2021-07-04 ENCOUNTER — Ambulatory Visit (HOSPITAL_COMMUNITY)
Admission: RE | Admit: 2021-07-04 | Discharge: 2021-07-04 | Disposition: A | Discharge status: Home / Self Care | Payer: Medicaid Other | Source: Ambulatory Visit | Attending: Nurse Practitioner | Admitting: Nurse Practitioner

## 2021-07-04 ENCOUNTER — Other Ambulatory Visit: Payer: Self-pay | Admitting: Nurse Practitioner

## 2021-07-04 ENCOUNTER — Other Ambulatory Visit: Payer: Self-pay

## 2021-07-04 ENCOUNTER — Encounter: Payer: Self-pay | Admitting: Nurse Practitioner

## 2021-07-04 DIAGNOSIS — Z1231 Encounter for screening mammogram for malignant neoplasm of breast: Secondary | ICD-10-CM | POA: Diagnosis present

## 2021-07-05 ENCOUNTER — Other Ambulatory Visit: Payer: Self-pay | Admitting: Nurse Practitioner

## 2021-07-08 ENCOUNTER — Other Ambulatory Visit: Payer: Self-pay | Admitting: Nurse Practitioner

## 2021-07-13 ENCOUNTER — Ambulatory Visit: Payer: Medicaid Other | Admitting: Nurse Practitioner

## 2021-07-13 ENCOUNTER — Encounter: Payer: Self-pay | Admitting: Nurse Practitioner

## 2021-07-13 VITALS — BP 125/74 | HR 79 | Temp 98.2°F | Wt 161.0 lb

## 2021-07-13 DIAGNOSIS — J310 Chronic rhinitis: Secondary | ICD-10-CM | POA: Diagnosis not present

## 2021-07-13 DIAGNOSIS — J329 Chronic sinusitis, unspecified: Secondary | ICD-10-CM

## 2021-07-13 DIAGNOSIS — M545 Low back pain, unspecified: Secondary | ICD-10-CM | POA: Diagnosis not present

## 2021-07-13 DIAGNOSIS — G8929 Other chronic pain: Secondary | ICD-10-CM

## 2021-07-13 MED ORDER — BACLOFEN 10 MG PO TABS: ORAL_TABLET | ORAL | 0 refills | Status: DC

## 2021-07-13 MED ORDER — AMOXICILLIN 875 MG PO TABS: 875.0000 mg | ORAL_TABLET | Freq: Two times a day (BID) | ORAL | 0 refills | Status: AC

## 2021-07-13 NOTE — Progress Notes (Signed)
? ?  Subjective:  ? ? Patient ID: Destiny Manning, female    DOB: 1970-11-09, 51 y.o.   MRN: [id number] ? ?HPI ? ?51 year old female reports to clinic for nasal and sinus congestion, ear pain, sore throat, runny nose with greenish mucus, and productive cough with yellow sputum x5 days.  Patient denies fever, body aches, chills, shortness of breath, wheezing. ? ?Patient states she would also like a refill on her baclofen. ? ?Review of Systems  ?HENT:  Positive for congestion, ear pain, postnasal drip, rhinorrhea, sinus pressure and sore throat.   ?Respiratory:  Positive for cough.   ?All other systems reviewed and are negative. ? ?   ?Objective:  ? Physical Exam ?Vitals reviewed.  ?Constitutional:   ?   General: She is not in acute distress. ?   Appearance: Normal appearance. She is normal weight. She is not ill-appearing or toxic-appearing.  ?HENT:  ?   Head: Normocephalic and atraumatic.  ?   Right Ear: There is impacted cerumen.  ?   Left Ear: Tympanic membrane, ear canal and external ear normal. There is no impacted cerumen.  ?   Nose: Nose normal. No congestion or rhinorrhea.  ?   Mouth/Throat:  ?   Mouth: Mucous membranes are moist.  ?   Pharynx: No oropharyngeal exudate or posterior oropharyngeal erythema.  ?Eyes:  ?   General:     ?   Right eye: No discharge.     ?   Left eye: No discharge.  ?Neck:  ?   Vascular: No carotid bruit.  ?Cardiovascular:  ?   Rate and Rhythm: Normal rate and regular rhythm.  ?   Pulses: Normal pulses.  ?   Heart sounds: Normal heart sounds. No murmur heard. ?Pulmonary:  ?   Effort: Pulmonary effort is normal. No respiratory distress.  ?   Breath sounds: Normal breath sounds. No stridor. No wheezing, rhonchi or rales.  ?Chest:  ?   Chest wall: No tenderness.  ?Musculoskeletal:  ?   Cervical back: Normal range of motion and neck supple. No rigidity or tenderness.  ?   Comments: Grossly intact  ?Lymphadenopathy:  ?   Cervical: No cervical adenopathy.  ?Skin: ?   General: Skin is warm.  ?    Capillary Refill: Capillary refill takes less than 2 seconds.  ?Neurological:  ?   Mental Status: She is alert.  ?   Comments: Grossly intact  ?Psychiatric:     ?   Mood and Affect: Mood normal.     ?   Behavior: Behavior normal.  ? ? ?   ?Assessment & Plan:  ? ?1. Rhinosinusitis ?-Viral versus bacterial. ?-May use Mucinex (no DM) and Nettie pot over-the-counter for symptom relief. ?-If symptoms not better within 24 hours may start amoxicillin. ?- amoxicillin (AMOXIL) 875 MG tablet; Take 1 tablet (875 mg total) by mouth 2 (two) times daily for 7 days.  Dispense: 14 tablet; Refill: 0 ?- COVID-19, Flu A+B and RSV ?-Return to clinic if symptoms not better or if they worsen. ? ?2. Chronic midline low back pain without sciatica ?- baclofen (LIORESAL) 10 MG tablet; TAKE 1 TABLET BY MOUTH WITH FOOD OR MILK 3 TIMES DAILY AS NEEDED.  Dispense: 90 tablet; Refill: 0 ? ?  ?Note:  This document was prepared using Dragon voice recognition software and may include unintentional dictation errors. ? ?

## 2021-07-13 NOTE — Patient Instructions (Signed)
Debrox wax removal kit ? ?Mucinex (No "DM") ? ?Neti Pot or other Sinus rinse system ?

## 2021-07-15 LAB — COVID-19, FLU A+B AND RSV
Influenza A, NAA: NOT DETECTED
Influenza B, NAA: NOT DETECTED
RSV, NAA: NOT DETECTED
SARS-CoV-2, NAA: NOT DETECTED

## 2021-07-15 LAB — SPECIMEN STATUS REPORT

## 2021-07-20 ENCOUNTER — Encounter (HOSPITAL_COMMUNITY): Payer: Self-pay | Admitting: Physical Therapy

## 2021-07-20 ENCOUNTER — Ambulatory Visit (HOSPITAL_COMMUNITY): Payer: Medicaid Other | Attending: Nurse Practitioner | Admitting: Physical Therapy

## 2021-07-20 DIAGNOSIS — R29898 Other symptoms and signs involving the musculoskeletal system: Secondary | ICD-10-CM | POA: Diagnosis present

## 2021-07-20 DIAGNOSIS — M25571 Pain in right ankle and joints of right foot: Secondary | ICD-10-CM | POA: Insufficient documentation

## 2021-07-20 DIAGNOSIS — R2689 Other abnormalities of gait and mobility: Secondary | ICD-10-CM | POA: Insufficient documentation

## 2021-07-20 DIAGNOSIS — M25572 Pain in left ankle and joints of left foot: Secondary | ICD-10-CM | POA: Diagnosis present

## 2021-07-20 NOTE — Therapy (Signed)
?OUTPATIENT PHYSICAL THERAPY LOWER EXTREMITY EVALUATION ? ? ?Patient Name: Destiny Manning ?MRN: [id number] ?DOB:24-Jun-1970, 51 y.o., female ?Today's Date: 07/20/2021 ? ? PT End of Session - 07/20/21 1356   ? ? Visit Number 1   ? Number of Visits 12   ? Date for PT Re-Evaluation 08/31/21   ? Authorization Type Medicaid [employer]   ? Authorization Time Period 12 visits requested - check auth   ? Authorization - Visit Number 1   ? Authorization - Number of Visits 1   ? PT Start Time 1358   ? PT Stop Time 1453   ? PT Time Calculation (min) 55 min   ? Activity Tolerance Patient tolerated treatment well   ? Behavior During Therapy [facility] for tasks assessed/performed   ? ?  ?  ? ?  ? ? ?Past Medical History:  ?Diagnosis Date  ? Asthma dx 06/04/07  ? ventolin, singulair  ? Blood transfusion without reported diagnosis   ? CHF (congestive heart failure) ([place]) 03/19/2008  ? a. 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 normalized by repeat imaging and at 55-60% in 04/2019,  ? DCM (dilated cardiomyopathy) ([place]) dx 06/06/07  ? EF 10-15%  ? Degeneration of spine   ? DJD (degenerative joint disease), lumbar 11/16/2016  ? chronic lumbar pain  ? GERD (gastroesophageal reflux disease)   ? takes Nexium Daily  ? Goiter 09/09/2020  ? Hypertension   ? Seasonal allergies   ? takes zyrtec  ? ?Past Surgical History:  ?Procedure Laterality Date  ? ABDOMINAL HYSTERECTOMY    ? fibroids; partial- ovaries intact  ? BIOPSY  02/17/2021  ? Procedure: BIOPSY;  Surgeon: [name], MD;  [facility];  Service: Endoscopy;;  ? [place]  2002  ? [place], ruptured disc  ? CHOLECYSTECTOMY  2001  ? laparoscopic, [facility]  ? COLONOSCOPY WITH PROPOFOL N/A 07/12/2017  ? tubular adenoma (10 mm polyp in cecum), segmental biopsies negative. 5 year surveillance  ? ESOPHAGOGASTRODUODENOSCOPY (EGD) WITH ESOPHAGEAL DILATION N/A 09/05/2012  ? UXN:ATFTD hiatal hernia s/p esophageal dilation with 35 F Maloney.   ?  ESOPHAGOGASTRODUODENOSCOPY (EGD) WITH PROPOFOL N/A 07/12/2017  ? normal esophagus s/p dilation, normal duodenal bulb and second portion of duodenum  ? ESOPHAGOGASTRODUODENOSCOPY (EGD) WITH PROPOFOL N/A 02/17/2021  ? Procedure: ESOPHAGOGASTRODUODENOSCOPY (EGD) WITH PROPOFOL;  Surgeon: [name], MD;  [facility];  Service: Endoscopy;  Laterality: N/A;  11:30am  ? LAPAROSCOPIC BILATERAL SALPINGO OOPHERECTOMY Bilateral 12/15/2020  ? Procedure: LAPAROSCOPIC BILATERAL SALPINGO OOPHORECTOMY;  Surgeon: [name], MD;  Location: [facility];  Service: Gynecology;  Laterality: Bilateral;  ? MALONEY DILATION N/A 07/12/2017  ? Procedure: MALONEY DILATION;  Surgeon: [name], MD;  [facility];  Service: Endoscopy;  Laterality: N/A;  ? MALONEY DILATION N/A 02/17/2021  ? Procedure: MALONEY DILATION;  Surgeon: [name], MD;  [facility];  Service: Endoscopy;  Laterality: N/A;  ? POLYPECTOMY  07/12/2017  ? Procedure: POLYPECTOMY;  Surgeon: [name], MD;  [facility];  Service: Endoscopy;;  Cecal polyp (HS)  ? SPINE SURGERY    ? TUBAL LIGATION    ? VAGINAL HYSTERECTOMY  10/19/2010  ? Procedure: HYSTERECTOMY VAGINAL;  Surgeon: [name], MD;  Location: [facility];  Service: Gynecology;  Laterality: N/A;  ? ?Patient Active Problem List  ? Diagnosis Date Noted  ? Chronic right shoulder pain 05/19/2021  ? Encounter for screening fecal  occult blood testing 11/02/2020  ? Encounter for well woman exam with routine gynecological exam 11/02/2020  ? Pelvic pain in female 11/02/2020  ? Enlarged thyroid 11/02/2020  ? Multinodular goiter 09/23/2020  ? Goiter 09/09/2020  ? Screening due 09/09/2020  ? Left shoulder pain 09/09/2020  ? Encounter to establish care 10/31/2019  ? Tubular adenoma of colon 07/16/2017  ? Diarrhea 05/23/2017  ? Personal history of noncompliance with medical treatment 04/05/2017  ? CAD in native artery 02/21/2017  ? 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 11/16/2016   ? Hypertension 11/16/2016  ? Seasonal allergies 11/16/2016  ? Asthma in adult, mild persistent, uncomplicated 07/37/1062  ? Chronic lower back pain 11/16/2016  ? H/O cervical spine surgery 11/16/2016  ? GERD (gastroesophageal reflux disease) 08/13/2012  ? Dysphagia 08/13/2012  ? Constipation 07/11/2012  ? ? ?PCP: [name], [name], NP ? ?REFERRING PROVIDER: [name], [name], NP ? ?REFERRING DIAG: M25.571,M25.572 (ICD-10-CM) - Acute bilateral ankle pain  ? ?THERAPY DIAG:  ?Bilateral ankle pain, unspecified chronicity ? ?Other abnormalities of gait and mobility ? ?Other symptoms and signs involving the musculoskeletal system ? ?ONSET DATE: March 2023 ? ?SUBJECTIVE:  ? ?SUBJECTIVE STATEMENT: ?Patient states symptoms began about a month ago with insidious onset. She sprained her ankles years ago without any trouble from it. Feet and ankles have been swelling. Pain in ankles and in foot in both legs. Symptoms increase with standing, walking. She has been unsteady and loosing balance. Has not found anything that helps symptoms.  ? ?PERTINENT HISTORY: ?Chronic ankle pain, chronic back pain ? ?PAIN:  ?Are you having pain? Yes: NPRS scale: 0/10 current, worst 8/10 ?Pain location: bilateral ankles ?Pain description: tight/sore ?Aggravating factors: standing/walking ?Relieving factors: none ? ?PRECAUTIONS: None ? ?WEIGHT BEARING RESTRICTIONS No ? ?FALLS:  ?Has patient fallen in last 6 months? Yes. Number of falls 1 ? ?LIVING ENVIRONMENT: ?Lives with: lives with their family ?Lives in: Mobile home ?Stairs: Yes: External: 1 steps; none ?Has following equipment at home: None ? ?OCCUPATION: Interviewer- works remote ? ?PLOF: Independent ? ?PATIENT GOALS get ankles feeling better to get  ? ? ?OBJECTIVE:  ? ?DIAGNOSTIC FINDINGS: IMPRESSION:No significant osteoarthritis. Minimal distal malleolar soft tissue ?swelling. No acute abnormality. ? ? ?COGNITION: ? Overall cognitive status: Within functional limits for tasks  assessed   ?  ?SENSATION: ?WFL ? ? ?POSTURE:  ?WNL ? ?PALPATION: ?TTP R ankle talocrual joint, L achilles tendon proximally; hypomobile talocrual AP bilaterally with greater symptoms on R ? ?LE ROM: stating tightness throughout ? ?Active ROM Right ?07/20/2021 Left ?07/20/2021  ?Hip flexion    ?Hip extension    ?Hip abduction    ?Hip adduction    ?Hip internal rotation    ?Hip external rotation    ?Knee flexion    ?Knee extension    ?Ankle dorsiflexion 0 0  ?Ankle plantarflexion 40 45  ?Ankle inversion 38 33  ?Ankle eversion 8 9  ? (Blank rows = not tested) ? ?LE MMT: ? ?MMT Right ?07/20/2021 Left ?07/20/2021  ?Hip flexion    ?Hip extension    ?Hip abduction    ?Hip adduction    ?Hip internal rotation    ?Hip external rotation    ?Knee flexion 5/5 5/5  ?Knee extension 5/5 5/5  ?Ankle dorsiflexion 4+/5 * 5/5  ?Ankle plantarflexion    ?Ankle inversion 4+/5 4+/5  ?Ankle eversion 4+/5 4+/5  ? (Blank rows = not tested) *=pain ? ?FUNCTIONAL TESTS:  ?2 minute walk test: 330 feet ?Stairs - 7 inch -  4 RT, tightness throughout bilateral feet and ankles/soreness after first round in anterior ankle  ?SLS: R:7 seconds L: 10 seconds without UE support, min/mod sway, decreased medial arch in standing ? ?GAIT: ?Distance walked: 330 feet ?Assistive device utilized: None ?Level of assistance: Complete Independence ?Comments: 2MWT, slightly slow cadence ? ? ? ?TODAY'S TREATMENT: ?07/20/21 ?Calf stretch 2x 20 second holds bilateral in standing at counter, ?HR 2x 10  ?TR 2x 10 ? ? ? ?PATIENT EDUCATION:  ?PATIENT EDUCATION:  ?Education details: Patient educated on exam findings, POC, scope of PT, HEP, and compression garments for edema if needed. ?Person educated: Patient ?Education method: Explanation, Demonstration, and Handouts ?Education comprehension: verbalized understanding, returned demonstration, verbal cues required, and tactile cues required ? ? ?HOME EXERCISE PROGRAM: ?4/12 calf stretch, HR, TR ? ?ASSESSMENT: ? ?CLINICAL  IMPRESSION: ?Patient a 51 y.o. y.o. female who was seen today for physical therapy evaluation and treatment for bilateral ankle pain/stiffness. Patient will continue to benefit from physical therapy in order to improve function and reduce impairm

## 2021-07-20 NOTE — Patient Instructions (Signed)
Access Code: [email or web address] ?URL: [email or web address] ?Date: 07/20/2021 ?Prepared by: Mitzi Hansen Legrand Lasser ? ?Exercises ?- Gastroc Stretch on Wall (Mirrored)  - 3 x daily - 7 x weekly - 3 reps - 20 second  hold ?- Standing Heel Raises  - 2 x daily - 7 x weekly - 2 sets - 10 reps ?- Toe Raises with Counter Support  - 2 x daily - 7 x weekly - 2 sets - 10 reps ?

## 2021-07-27 ENCOUNTER — Ambulatory Visit (HOSPITAL_COMMUNITY): Payer: Medicaid Other

## 2021-07-27 DIAGNOSIS — M25571 Pain in right ankle and joints of right foot: Secondary | ICD-10-CM

## 2021-07-27 DIAGNOSIS — R29898 Other symptoms and signs involving the musculoskeletal system: Secondary | ICD-10-CM

## 2021-07-27 DIAGNOSIS — R2689 Other abnormalities of gait and mobility: Secondary | ICD-10-CM

## 2021-07-27 NOTE — Therapy (Signed)
OUTPATIENT PHYSICAL THERAPY TREATMENT NOTE   Patient Name: Destiny Manning MRN: [id number] DOB:[birth year], 51 y.o., female Today's Date: 07/27/2021  PCP: Deneise Lever, NP REFERRING PROVIDER: [name], Alvino Chapel, NP  END OF SESSION:   PT End of Session - 07/27/21 1150     Visit Number 2    Number of Visits 12    Date for PT Re-Evaluation 08/31/21    Authorization Type Medicaid [employer]    Authorization Time Period 12 visits requested - check auth; Patsy Lager states request is in    Authorization - Visit Number 2    Authorization - Number of Visits 1    PT Start Time 1115    PT Stop Time 1201    PT Time Calculation (min) 46 min    Activity Tolerance Patient tolerated treatment well    Behavior During Therapy WFL for tasks assessed/performed             Past Medical History:  Diagnosis Date   Asthma dx 06/04/07   ventolin, singulair   Blood transfusion without reported diagnosis    CHF (congestive heart failure) ([facility]) 03/19/2008   a. 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 normalized by repeat imaging and at 55-60% in 04/2019,   DCM (dilated cardiomyopathy) ([facility]) dx 06/06/07   EF 10-15%   Degeneration of spine    DJD (degenerative joint disease), lumbar 11/16/2016   chronic lumbar pain   GERD (gastroesophageal reflux disease)    takes Nexium Daily   Goiter 09/09/2020   Hypertension    Seasonal allergies    takes zyrtec   Past Surgical History:  Procedure Laterality Date   ABDOMINAL HYSTERECTOMY     fibroids; partial- ovaries intact   BIOPSY  02/17/2021   Procedure: BIOPSY;  Surgeon: Corbin Ade, MD;  [facility];  Service: Endoscopy;;   CERVICAL SPINE SURGERY  2002   [facility], ruptured disc   CHOLECYSTECTOMY  2001   laparoscopic, [facility]   COLONOSCOPY WITH PROPOFOL N/A 07/12/2017   tubular adenoma (10 mm polyp in cecum), segmental biopsies negative. 5 year surveillance   ESOPHAGOGASTRODUODENOSCOPY (EGD) WITH ESOPHAGEAL DILATION  N/A 09/05/2012   WUJ:[id number] hiatal hernia s/p esophageal dilation with 54 F Maloney.    ESOPHAGOGASTRODUODENOSCOPY (EGD) WITH PROPOFOL N/A 07/12/2017   normal esophagus s/p dilation, normal duodenal bulb and second portion of duodenum   ESOPHAGOGASTRODUODENOSCOPY (EGD) WITH PROPOFOL N/A 02/17/2021   Procedure: ESOPHAGOGASTRODUODENOSCOPY (EGD) WITH PROPOFOL;  Surgeon: Corbin Ade, MD;  [facility];  Service: Endoscopy;  Laterality: N/A;  11:30am   LAPAROSCOPIC BILATERAL SALPINGO OOPHERECTOMY Bilateral 12/15/2020   Procedure: LAPAROSCOPIC BILATERAL SALPINGO OOPHORECTOMY;  Surgeon: Lazaro Arms, MD;  Location: [facility];  Service: Gynecology;  Laterality: Bilateral;   MALONEY DILATION N/A 07/12/2017   Procedure: Elease Hashimoto DILATION;  Surgeon: Corbin Ade, MD;  [facility];  Service: Endoscopy;  Laterality: N/A;   MALONEY DILATION N/A 02/17/2021   Procedure: Elease Hashimoto DILATION;  Surgeon: Corbin Ade, MD;  [facility]: [facility];  Service: Endoscopy;  Laterality: N/A;   POLYPECTOMY  07/12/2017   Procedure: POLYPECTOMY;  Surgeon: Corbin Ade, MD;  [facility];  Service: Endoscopy;;  Cecal polyp (HS)   SPINE SURGERY     TUBAL LIGATION     VAGINAL HYSTERECTOMY  10/19/2010   Procedure: HYSTERECTOMY VAGINAL;  Surgeon: Lazaro Arms, MD;  Location: [facility];  Service: Gynecology;  Laterality: N/A;   Patient Active  Problem List   Diagnosis Date Noted   Chronic right shoulder pain 05/19/2021   Encounter for screening fecal occult blood testing 11/02/2020   Encounter for well woman exam with routine gynecological exam 11/02/2020   Pelvic pain in female 11/02/2020   Enlarged thyroid 11/02/2020   Multinodular goiter 09/23/2020   Goiter 09/09/2020   Screening due 09/09/2020   Left shoulder pain 09/09/2020   Encounter to establish care 10/31/2019   Tubular adenoma of colon 07/16/2017   Diarrhea 05/23/2017   Personal history of noncompliance with medical treatment  04/05/2017   CAD in native artery 02/21/2017   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 11/16/2016   Hypertension 11/16/2016   Seasonal allergies 11/16/2016   Asthma in adult, mild persistent, uncomplicated 11/16/2016   Chronic lower back pain 11/16/2016   H/O cervical spine surgery 11/16/2016   GERD (gastroesophageal reflux disease) 08/13/2012   Dysphagia 08/13/2012   Constipation 07/11/2012    REFERRING DIAG:  M25.571,M25.572 (ICD-10-CM) - Acute bilateral ankle pain   THERAPY DIAG:  Bilateral ankle pain, unspecified chronicity  Other abnormalities of gait and mobility  Other symptoms and signs involving the musculoskeletal system  PERTINENT HISTORY: Chronic ankle pain, chronic back pain  PRECAUTIONS: none  SUBJECTIVE: Patient reports compliance with HEP. She is sore today from her exercises but overall doing well; no falls no other issues. Patient reports minimal to no swelling since start of therapy.   PAIN:  Are you having pain? No  *all italicized information from evaluation    OBJECTIVE:    DIAGNOSTIC FINDINGS: IMPRESSION:No significant osteoarthritis. Minimal distal malleolar soft tissue swelling. No acute abnormality.     COGNITION:           Overall cognitive status: Within functional limits for tasks assessed                          SENSATION: WFL     POSTURE:  WNL   PALPATION: TTP R ankle talocrual joint, L achilles tendon proximally; hypomobile talocrual AP bilaterally with greater symptoms on R   LE ROM: stating tightness throughout   Active ROM Right 07/20/2021 Left 07/20/2021 Right 07/27/21  Hip flexion       Hip extension       Hip abduction       Hip adduction       Hip internal rotation       Hip external rotation       Knee flexion       Knee extension       Ankle dorsiflexion 0 0 8  Ankle plantarflexion 40 45   Ankle inversion 38 33   Ankle eversion 8 9    (Blank rows = not tested)   LE MMT:   MMT Right 07/20/2021 Left 07/20/2021   Hip flexion      Hip extension      Hip abduction      Hip adduction      Hip internal rotation      Hip external rotation      Knee flexion 5/5 5/5  Knee extension 5/5 5/5  Ankle dorsiflexion 4+/5 * 5/5  Ankle plantarflexion      Ankle inversion 4+/5 4+/5  Ankle eversion 4+/5 4+/5   (Blank rows = not tested) *=pain   FUNCTIONAL TESTS:  2 minute walk test: 330 feet Stairs - 7 inch - 4 RT, tightness throughout bilateral feet and ankles/soreness after first round in anterior ankle  SLS:  R:7 seconds L: 10 seconds without UE support, min/mod sway, decreased medial arch in standing   GAIT: Distance walked: 330 feet Assistive device utilized: None Level of assistance: Complete Independence Comments: , slightly slow cadence       TODAY'S TREATMENT:    07/27/21    Heel/toe rocks 2 x 10    Slant board 3 x 30"    Soleus stretch/ knee drive on 12" box 10 x 10"    Tandem stance 2 x 10 " each way        Sitting:    Calf stretch with strap 2 x 1" each    BAPS board level 3 CW and CCW x 1' each     07/20/21 Calf stretch 2x 20 second holds bilateral in standing at counter, HR 2x 10  TR 2x 10       PATIENT EDUCATION:  PATIENT EDUCATION:  Education details: Patient educated on exam findings, POC, scope of PT, HEP, and compression garments for edema if needed. Person educated: Patient Education method: Explanation, Demonstration, and Handouts Education comprehension: verbalized understanding, returned demonstration, verbal cues required, and tactile cues required     HOME EXERCISE PROGRAM: 4/12 calf stretch, HR, TR  07/27/21 Access Code: [email or web address]  Exercises - Standing Tandem Balance with Counter Support  - 1 x daily - 7 x weekly - 1 sets - 3 reps - 10 sec hold - Standing Dorsiflexion Self-Mobilization on Step  - 1 x daily - 7 x weekly - 1 sets - 10 reps - 10 sec hold - Seated Calf Stretch with Strap  - 1 x daily - 7 x weekly - 1 sets - 3 reps - 30 sec hold    ASSESSMENT:   CLINICAL IMPRESSION: Today's session we reviewed patient's HEP and goals; she verbalized agreement with goals and demonstrated correct form with HEP. Therapist progressed stretching exercises and added balance activity tandem stance today; added BAPS board for improved proprioception. AROM ankle Dorsiflexion today R ankle 8 degrees. Patient will benefit from continued skilled therapy interventions to address deficits and improve functional mobility.        OBJECTIVE IMPAIRMENTS Abnormal gait, decreased activity tolerance, decreased balance, decreased endurance, decreased mobility, difficulty walking, decreased ROM, and decreased strength.    ACTIVITY LIMITATIONS cleaning, community activity, meal prep, occupation, laundry, yard work, and shopping.    PERSONAL FACTORS Fitness, Time since onset of injury/illness/exacerbation, and 1-2 comorbidities: hx ankle sprains, cardiac  are also affecting patient's functional outcome.      REHAB POTENTIAL: Good   CLINICAL DECISION MAKING: Stable/uncomplicated   EVALUATION COMPLEXITY: Low     GOALS: Goals reviewed with patient? No   SHORT TERM GOALS: Target date: 08/10/2021   Patient will be independent with HEP in order to improve functional outcomes. Baseline:  Goal status: ongoing   2.  Patient will report at least 25% improvement in symptoms for improved quality of life. Baseline:  Goal status: ongoing       LONG TERM GOALS: Target date: 08/31/2021   Patient will report at least 75% improvement in symptoms for improved quality of life. Baseline:  Goal status: ongoing   2.  Patient will be able to complete 5x STS in under 11.4 seconds in order to reduce the risk of falls. Baseline:  Goal status: ongoing   3.  Patient will be able to ambulate at least 450 feet in in order to demonstrate improved gait speed for community ambulation.  Baseline:  Goal status: ongoing  4.  Patient will perform SLS for at least 30  seconds bilaterally in order to demonstrate improved balance to reduce the risk for falls at home and in the community.  Baseline:  Goal status: ongoing   5. Patient will demonstrate improved ankle dorsiflexion bilaterally to at least 10 degrees for improved gait mechanics and stair navigation Baseline:  Goal status: ongoing         PLAN: PT FREQUENCY: 1-2x/week   PT DURATION: 6 weeks   PLANNED INTERVENTIONS: Therapeutic exercises, Therapeutic activity, Neuromuscular re-education, Balance training, Gait training, Patient/Family education, Joint manipulation, Joint mobilization, Stair training, Orthotic/Fit training, DME instructions, Aquatic Therapy, Dry Needling, Electrical stimulation, Spinal manipulation, Spinal mobilization, Cryotherapy, Moist heat, Compression bandaging, scar mobilization, Splintting, Taping, Traction, Ultrasound, Ionotophoresis 4mg /ml Dexamethasone, and Manual therapy   PLAN FOR NEXT SESSION: progress ankle strengthening and mobility as able; progress balance     12:03 PM, 07/27/21 Madalaine Portier Small Hattie Aguinaldo MPT [facility] [place] [phone] Ph:[phone]

## 2021-08-03 ENCOUNTER — Other Ambulatory Visit: Payer: Self-pay | Admitting: Nurse Practitioner

## 2021-08-03 ENCOUNTER — Other Ambulatory Visit: Payer: Self-pay | Admitting: Student

## 2021-08-03 ENCOUNTER — Encounter (HOSPITAL_COMMUNITY): Payer: Medicaid Other

## 2021-08-03 ENCOUNTER — Other Ambulatory Visit: Payer: Self-pay | Admitting: Internal Medicine

## 2021-08-03 DIAGNOSIS — G8929 Other chronic pain: Secondary | ICD-10-CM

## 2021-08-05 ENCOUNTER — Ambulatory Visit (HOSPITAL_COMMUNITY): Payer: Medicaid Other

## 2021-08-05 ENCOUNTER — Telehealth (HOSPITAL_COMMUNITY): Payer: Self-pay

## 2021-08-05 NOTE — Telephone Encounter (Signed)
Pt called stating her back has went out on her and she is not able to do PT for her ankle today ?

## 2021-08-08 ENCOUNTER — Other Ambulatory Visit: Payer: Self-pay | Admitting: Internal Medicine

## 2021-08-08 DIAGNOSIS — G8929 Other chronic pain: Secondary | ICD-10-CM

## 2021-08-09 ENCOUNTER — Encounter: Payer: Self-pay | Admitting: Nurse Practitioner

## 2021-08-09 ENCOUNTER — Other Ambulatory Visit: Payer: Self-pay | Admitting: Nurse Practitioner

## 2021-08-09 DIAGNOSIS — G8929 Other chronic pain: Secondary | ICD-10-CM

## 2021-08-09 MED ORDER — FLUTICASONE PROPIONATE 50 MCG/ACT NA SUSP: 1.0000 | Freq: Every day | NASAL | 3 refills | Status: DC

## 2021-08-09 MED ORDER — CETIRIZINE HCL 10 MG PO TABS: 10.0000 mg | ORAL_TABLET | Freq: Every day | ORAL | 0 refills | Status: DC

## 2021-08-09 MED ORDER — GABAPENTIN 800 MG PO TABS: 800.0000 mg | ORAL_TABLET | Freq: Three times a day (TID) | ORAL | 1 refills | Status: DC

## 2021-08-09 MED ORDER — MONTELUKAST SODIUM 10 MG PO TABS: 10.0000 mg | ORAL_TABLET | Freq: Every day | ORAL | 2 refills | Status: DC

## 2021-08-10 ENCOUNTER — Encounter (HOSPITAL_COMMUNITY): Payer: Self-pay | Admitting: Physical Therapy

## 2021-08-10 ENCOUNTER — Ambulatory Visit (HOSPITAL_COMMUNITY): Payer: Medicaid Other | Attending: Nurse Practitioner | Admitting: Physical Therapy

## 2021-08-10 ENCOUNTER — Other Ambulatory Visit: Payer: Self-pay | Admitting: Nurse Practitioner

## 2021-08-10 DIAGNOSIS — R2689 Other abnormalities of gait and mobility: Secondary | ICD-10-CM | POA: Insufficient documentation

## 2021-08-10 DIAGNOSIS — M545 Other chronic pain: Secondary | ICD-10-CM

## 2021-08-10 DIAGNOSIS — R29898 Other symptoms and signs involving the musculoskeletal system: Secondary | ICD-10-CM | POA: Diagnosis present

## 2021-08-10 DIAGNOSIS — M25572 Pain in left ankle and joints of left foot: Secondary | ICD-10-CM | POA: Diagnosis present

## 2021-08-10 DIAGNOSIS — M25571 Pain in right ankle and joints of right foot: Secondary | ICD-10-CM | POA: Insufficient documentation

## 2021-08-10 NOTE — Therapy (Signed)
?OUTPATIENT PHYSICAL THERAPY TREATMENT NOTE ? ? ?Patient Name: Destiny Manning ?MRN: [id number] ?DOB:1971/04/01, 51 y.o., female ?Today's Date: 08/10/2021 ? ?PCP: [name], [name], NP ?REFERRING PROVIDER: [name], [name], NP ? ?END OF SESSION:  ? PT End of Session - 08/10/21 1123   ? ? Visit Number 3   ? Number of Visits 12   ? Date for PT Re-Evaluation 08/31/21   ? Authorization Type Medicaid [employer]   ? Authorization Time Period 12 visits approved 4/20-5/24/23   ? Authorization - Visit Number 3   ? Authorization - Number of Visits 12   ? PT Start Time 1118   ? PT Stop Time 4580   ? PT Time Calculation (min) 40 min   ? Activity Tolerance Patient tolerated treatment well   ? Behavior During Therapy [facility] for tasks assessed/performed   ? ?  ?  ? ?  ? ? ?Past Medical History:  ?Diagnosis Date  ? Asthma dx 06/04/07  ? ventolin, singulair  ? Blood transfusion without reported diagnosis   ? CHF (congestive heart failure) ([place]) 03/19/2008  ? a. 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 normalized by repeat imaging and at 55-60% in 04/2019,  ? DCM (dilated cardiomyopathy) ([place]) dx 06/06/07  ? EF 10-15%  ? Degeneration of spine   ? DJD (degenerative joint disease), lumbar 11/16/2016  ? chronic lumbar pain  ? GERD (gastroesophageal reflux disease)   ? takes Nexium Daily  ? Goiter 09/09/2020  ? Hypertension   ? Seasonal allergies   ? takes zyrtec  ? ?Past Surgical History:  ?Procedure Laterality Date  ? ABDOMINAL HYSTERECTOMY    ? fibroids; partial- ovaries intact  ? BIOPSY  02/17/2021  ? Procedure: BIOPSY;  Surgeon: [name], MD;  [facility];  Service: Endoscopy;;  ? [place]  2002  ? [place], ruptured disc  ? CHOLECYSTECTOMY  2001  ? laparoscopic, [facility]  ? COLONOSCOPY WITH PROPOFOL N/A 07/12/2017  ? tubular adenoma (10 mm polyp in cecum), segmental biopsies negative. 5 year surveillance  ? ESOPHAGOGASTRODUODENOSCOPY (EGD) WITH ESOPHAGEAL DILATION N/A 09/05/2012  ? DXI:PJASN hiatal  hernia s/p esophageal dilation with 56 F Maloney.   ? ESOPHAGOGASTRODUODENOSCOPY (EGD) WITH PROPOFOL N/A 07/12/2017  ? normal esophagus s/p dilation, normal duodenal bulb and second portion of duodenum  ? ESOPHAGOGASTRODUODENOSCOPY (EGD) WITH PROPOFOL N/A 02/17/2021  ? Procedure: ESOPHAGOGASTRODUODENOSCOPY (EGD) WITH PROPOFOL;  Surgeon: [name], MD;  [facility];  Service: Endoscopy;  Laterality: N/A;  11:30am  ? LAPAROSCOPIC BILATERAL SALPINGO OOPHERECTOMY Bilateral 12/15/2020  ? Procedure: LAPAROSCOPIC BILATERAL SALPINGO OOPHORECTOMY;  Surgeon: [name], MD;  Location: [facility] OR[facility];  Service: Gynecology;  Laterality: Bilateral;  ? MALONEY DILATION N/A 07/12/2017  ? Procedure: MALONEY DILATION;  Surgeon: [name], MD;  [facility];  Service: Endoscopy;  Laterality: N/A;  ? MALONEY DILATION N/A 02/17/2021  ? Procedure: MALONEY DILATION;  Surgeon: [name], MD;  [facility];  Service: Endoscopy;  Laterality: N/A;  ? POLYPECTOMY  07/12/2017  ? Procedure: POLYPECTOMY;  Surgeon: [name], MD;  [facility];  Service: Endoscopy;;  Cecal polyp (HS)  ? SPINE SURGERY    ? TUBAL LIGATION    ? VAGINAL HYSTERECTOMY  10/19/2010  ? Procedure: HYSTERECTOMY VAGINAL;  Surgeon: [name], MD;  Location: [facility];  Service: Gynecology;  Laterality: N/A;  ? ?Patient Active Problem List  ? Diagnosis Date Noted  ?  Chronic right shoulder pain 05/19/2021  ? Encounter for screening fecal occult blood testing 11/02/2020  ? Encounter for well woman exam with routine gynecological exam 11/02/2020  ? Pelvic pain in female 11/02/2020  ? Enlarged thyroid 11/02/2020  ? Multinodular goiter 09/23/2020  ? Goiter 09/09/2020  ? Screening due 09/09/2020  ? Left shoulder pain 09/09/2020  ? Encounter to establish care 10/31/2019  ? Tubular adenoma of colon 07/16/2017  ? Diarrhea 05/23/2017  ? Personal history of noncompliance with medical treatment 04/05/2017  ? CAD in native artery  02/21/2017  ? 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 11/16/2016  ? Hypertension 11/16/2016  ? Seasonal allergies 11/16/2016  ? Asthma in adult, mild persistent, uncomplicated 38/75/6433  ? Chronic lower back pain 11/16/2016  ? H/O cervical spine surgery 11/16/2016  ? GERD (gastroesophageal reflux disease) 08/13/2012  ? Dysphagia 08/13/2012  ? Constipation 07/11/2012  ? ? ?REFERRING DIAG:  M25.571,M25.572 (ICD-10-CM) - Acute bilateral ankle pain  ? ?THERAPY DIAG:  ?Bilateral ankle pain, unspecified chronicity ? ?Other abnormalities of gait and mobility ? ?Other symptoms and signs involving the musculoskeletal system ? ?PERTINENT HISTORY: Chronic ankle pain, chronic back pain ? ?PRECAUTIONS: none ? ?SUBJECTIVE: Patient says things are going well. No pain right now, but notes some ongoing swelling.  ? ?PAIN:  ?Are you having pain? No ? ?*all italicized information from evaluation ? ? ? ?OBJECTIVE:  ?  ?DIAGNOSTIC FINDINGS: IMPRESSION:No significant osteoarthritis. Minimal distal malleolar soft tissue ?swelling. No acute abnormality. ?  ?  ?COGNITION: ?          Overall cognitive status: Within functional limits for tasks assessed               ?           ?SENSATION: ?WFL ?  ?  ?POSTURE:  ?WNL ?  ?PALPATION: ?TTP R ankle talocrual joint, L achilles tendon proximally; hypomobile talocrual AP bilaterally with greater symptoms on R ?  ?LE ROM: stating tightness throughout ?  ?Active ROM Right ?07/20/2021 Left ?07/20/2021 Right ?07/27/21  ?Hip flexion       ?Hip extension       ?Hip abduction       ?Hip adduction       ?Hip internal rotation       ?Hip external rotation       ?Knee flexion       ?Knee extension       ?Ankle dorsiflexion 0 0 8  ?Ankle plantarflexion 40 45   ?Ankle inversion 38 33   ?Ankle eversion 8 9   ? (Blank rows = not tested) ?  ?LE MMT: ?  ?MMT Right ?07/20/2021 Left ?07/20/2021  ?Hip flexion      ?Hip extension      ?Hip abduction      ?Hip adduction      ?Hip internal rotation      ?Hip external rotation       ?Knee flexion 5/5 5/5  ?Knee extension 5/5 5/5  ?Ankle dorsiflexion 4+/5 * 5/5  ?Ankle plantarflexion      ?Ankle inversion 4+/5 4+/5  ?Ankle eversion 4+/5 4+/5  ? (Blank rows = not tested) *=pain ?  ?FUNCTIONAL TESTS:  ?2 minute walk test: 330 feet ?Stairs - 7 inch - 4 RT, tightness throughout bilateral feet and ankles/soreness after first round in anterior ankle  ?SLS: R:7 seconds L: 10 seconds without UE support, min/mod sway, decreased medial arch in standing ?  ?GAIT: ?Distance walked: 330 feet ?Assistive device utilized:  None ?Level of assistance: Complete Independence ?Comments: 2MWT, slightly slow cadence ?  ?  ?  ?TODAY'S TREATMENT: ?   08/10/21 ?   Slant board stretch 3 x 20"   ?   Heel/ toe raises x20  ?   BAPS board level 3 DF/ PF/ CW and CCW x 1' each  ?GTB ankle 3 way PF/INV/ EV x 20 each  ?   Tandem stance on foam 3 x 20"  ?   Sidestepping over 4 inch box x20  ? ? ?07/27/21 ?   Heel/toe rocks 2 x 10 ?   Slant board 3 x 30" ?   Soleus stretch/ knee drive on 12" box 10 x 10" ?   Tandem stance 2 x 10 " each way ?    ?   Sitting: ?   Calf stretch with strap 2 x 1" each ?   BAPS board level 3 CW and CCW x 1' each ?    ?07/20/21 ?Calf stretch 2x 20 second holds bilateral in standing at counter, ?HR 2x 10  ?TR 2x 10 ?  ?  ?  ?PATIENT EDUCATION:  ?PATIENT EDUCATION:  ?Education details: Patient educated on exam findings, POC, scope of PT, HEP, and compression garments for edema if needed. ?Person educated: Patient ?Education method: Explanation, Demonstration, and Handouts ?Education comprehension: verbalized understanding, returned demonstration, verbal cues required, and tactile cues required ?  ?  ?HOME EXERCISE PROGRAM: ?4/12 calf stretch, HR, TR ? ?07/27/21 Access Code: [email or web address] ? ?Exercises ?- Standing Tandem Balance with Counter Support  - 1 x daily - 7 x weekly - 1 sets - 3 reps - 10 sec hold ?- Standing Dorsiflexion Self-Mobilization on Step  - 1 x daily - 7 x weekly - 1 sets - 10 reps - 10 sec hold ?-  Seated Calf Stretch with Strap  - 1 x daily - 7 x weekly - 1 sets - 3 reps - 30 sec hold ? 08/10/21 ? Ankle band INV/EV/PF ? ?ASSESSMENT: ?  ?CLINICAL IMPRESSION: ? Patient tolerated session well today with

## 2021-08-15 ENCOUNTER — Ambulatory Visit: Payer: Medicaid Other | Admitting: Gastroenterology

## 2021-08-17 ENCOUNTER — Encounter (HOSPITAL_COMMUNITY): Payer: Medicaid Other | Admitting: Physical Therapy

## 2021-08-17 NOTE — Progress Notes (Signed)
? ?Cardiology Office Note   ? ?Date:  08/24/2021  ? ?ID:  Destiny Manning, DOB June 15, 1970, MRN [id number] ? ? ?PCP:  [name], [name], NP ?  ?[place]  ?Cardiologist:  Dorris Carnes, MD   ?Advanced Practice Provider:  No care team member to display ?Electrophysiologist:  None  ? ?[id number]}  ? ?No chief complaint on file. ? ? ?History of Present Illness:  ?Destiny Manning is a 51 y.o. 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 ? ?Patient saw Ms. Strader, PA-C 07/2020 and CTA of neck ordered b/c of tortuosity of carotids. 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 [name] then recommended MRA abd/pelvis-09/2020 consistent with FMD and MRA brain ordered but not done. [name] recommended following. ? ?Patient comes in for f/u. BP well controlled at home. Denies chest pain, dyspnea, palpitations. Exercises daily-sit ups, twister board, lifting weights. Says she's going to start walking.  ? ? ? ?Past Medical History:  ?Diagnosis Date  ? Asthma dx 06/04/07  ? ventolin, singulair  ? Blood transfusion without reported diagnosis   ? CHF (congestive heart failure) ([place]) 03/19/2008  ? a. 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 normalized by repeat imaging and at 55-60% in 04/2019,  ? DCM (dilated cardiomyopathy) ([place]) dx 06/06/07  ? EF 10-15%  ? Degeneration of spine   ? DJD (degenerative joint disease), lumbar 11/16/2016  ? chronic lumbar pain  ? GERD (gastroesophageal reflux disease)   ? takes Nexium Daily  ? Goiter 09/09/2020  ? Hypertension   ? Seasonal allergies   ? takes zyrtec  ? ? ?Past Surgical History:  ?Procedure Laterality Date  ? ABDOMINAL HYSTERECTOMY    ? fibroids; partial- ovaries intact  ? BIOPSY  02/17/2021  ? Procedure: BIOPSY;  Surgeon: [name], MD;  [facility];  Service: Endoscopy;;  ? [place]  2002  ? [place], ruptured disc  ? CHOLECYSTECTOMY  2001  ? laparoscopic, [facility]  ? COLONOSCOPY WITH PROPOFOL N/A 07/12/2017  ? tubular adenoma (10 mm polyp in cecum), segmental biopsies negative. 5 year surveillance  ? ESOPHAGOGASTRODUODENOSCOPY (EGD) WITH ESOPHAGEAL DILATION N/A 09/05/2012  ? ALP:FXTKW hiatal hernia s/p esophageal dilation with 33 F Maloney.   ? ESOPHAGOGASTRODUODENOSCOPY (EGD) WITH PROPOFOL N/A 07/12/2017  ? normal esophagus s/p dilation, normal duodenal bulb and second portion of duodenum  ? ESOPHAGOGASTRODUODENOSCOPY (EGD) WITH PROPOFOL N/A 02/17/2021  ? Procedure: ESOPHAGOGASTRODUODENOSCOPY (EGD) WITH PROPOFOL;  Surgeon: [name], MD;  [facility];  Service: Endoscopy;  Laterality: N/A;  11:30am  ? LAPAROSCOPIC BILATERAL SALPINGO OOPHERECTOMY Bilateral 12/15/2020  ? Procedure: LAPAROSCOPIC BILATERAL SALPINGO OOPHORECTOMY;  Surgeon: [name], MD;  Location: [facility];  Service: Gynecology;  Laterality: Bilateral;  ? MALONEY DILATION N/A 07/12/2017  ? Procedure: MALONEY DILATION;  Surgeon: [name], MD;  [facility];  Service: Endoscopy;  Laterality: N/A;  ? MALONEY DILATION N/A 02/17/2021  ? Procedure: MALONEY DILATION;  Surgeon: [name], MD;  [facility];  Service: Endoscopy;  Laterality: N/A;  ? POLYPECTOMY  07/12/2017  ? Procedure: POLYPECTOMY;  Surgeon: [name], MD;  [facility];  Service: Endoscopy;;  Cecal polyp (HS)  ? SPINE SURGERY    ? TUBAL LIGATION    ?  VAGINAL HYSTERECTOMY  10/19/2010  ? Procedure: HYSTERECTOMY VAGINAL;  Surgeon: [name], MD;  Location: [facility];  Service: Gynecology;  Laterality: N/A;  ? ? ?Current Medications: ?Current Meds  ?Medication Sig  ? albuterol (VENTOLIN HFA) 108 (90 Base) MCG/ACT inhaler Inhale 2 puffs into the lungs every 6 (six) hours as needed for wheezing or shortness of breath.  ? atorvastatin (LIPITOR) 10 MG tablet Take 1  tablet (10 mg total) by mouth at bedtime.  ? baclofen (LIORESAL) 10 MG tablet TAKE 1 TABLET BY MOUTH WITH FOOD OR MILK 3 TIMES DAILY AS NEEDED.  ? carvedilol (COREG) 25 MG tablet TAKE ONE TABLET BY MOUTH 2 TIMES A DAY  ? cetirizine (ZYRTEC) 10 MG tablet Take 1 tablet (10 mg total) by mouth daily.  ? chlorthalidone (HYGROTON) 25 MG tablet Take 0.5 tablets (12.5 mg total) by mouth daily. NEEDS APPOINTMENT WITH CARDIOLOGIST FOR FURTHER REFILLS  ? dexlansoprazole (DEXILANT) 60 MG capsule Take 1 capsule (60 mg total) by mouth daily.  ? estradiol (ESTRACE) 2 MG tablet Take 1 tablet (2 mg total) by mouth daily.  ? fluticasone (FLONASE) 50 MCG/ACT nasal spray Place 1 spray into both nostrils daily.  ? Fluticasone-Salmeterol (ADVAIR) 500-50 MCG/DOSE AEPB Inhale 1 puff into the lungs 2 (two) times daily as needed (respiratory issues.).  ? gabapentin (NEURONTIN) 800 MG tablet Take 1 tablet (800 mg total) by mouth 3 (three) times daily.  ? ibuprofen (ADVIL) 600 MG tablet Take 1 tablet (600 mg total) by mouth every 8 (eight) hours as needed.  ? losartan (COZAAR) 100 MG tablet TAKE ONE TABLET BY MOUTH ONCE DAILY  ? lubiprostone (AMITIZA) 8 MCG capsule TAKE (1) CAPSULE BY MOUTH TWICE DAILY.  ? montelukast (SINGULAIR) 10 MG tablet Take 1 tablet (10 mg total) by mouth at bedtime.  ? ondansetron (ZOFRAN ODT) 8 MG disintegrating tablet Take 1 tablet (8 mg total) by mouth every 8 (eight) hours as needed for nausea or vomiting.  ? potassium chloride SA (KLOR-CON) 20 MEQ tablet Take 1 tablet (20 mEq total) by mouth 3 (three) times daily. (Patient taking differently: Take 20 mEq by mouth daily as needed (when instructed by MD).)  ? SUMAtriptan (IMITREX) 50 MG tablet Take 1 tablet (50 mg total) by mouth 2 (two) times daily as needed for migraine. May repeat in 2 hours if headache persists or recurs.  ?  ? ?Allergies:   Patient has no known allergies.  ? ?Social History  ? ?Socioeconomic History  ? Marital status: Single  ?  Spouse name:  Not on file  ? Number of children: 2  ? Years of education: 23  ? Highest education level: Not on file  ?Occupational History  ? Occupation: [employer]  ?  CommentQuarry manager- works with IVC patients  ?Tobacco Use  ? Smoking status: Former  ?  Packs/day: 0.25  ?  Years: 4.00  ?  Pack years: 1.00  ?  Types: Cigarettes  ?  Quit date: 03/14/2009  ?  Years since quitting: 12.4  ? Smokeless tobacco: Never  ?Vaping Use  ? Vaping Use: Never used  ?Substance and Sexual Activity  ? Alcohol use: No  ? Drug use: No  ? Sexual activity: Not Currently  ?  Birth control/protection: Surgical  ?  Comment: hyst  ?Other Topics Concern  ? Not on file  ?Social History Narrative  ? 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   ? Lives with 2 children- age 41 and 64;  she homeschools them  ? ?Social Determinants of Health  ? ?Financial Resource Strain: High Risk  ? Difficulty of Paying Living Expenses: Hard  ?Food Insecurity: No Food Insecurity  ? Worried About Charity fundraiser in the Last Year: Never true  ? Ran Out of Food in the Last Year: Never true  ?Transportation Needs: No Transportation Needs  ? Lack of Transportation (Medical): No  ? Lack of Transportation (Non-Medical): No  ?Physical Activity: Insufficiently Active  ? Days of Exercise per Week: 1 day  ? Minutes of Exercise per Session: 20 min  ?Stress: No Stress Concern Present  ? Feeling of Stress : Not at all  ?Social Connections: Socially Isolated  ? Frequency of Communication with Friends and Family: Once a week  ? Frequency of Social Gatherings with Friends and Family: Never  ? Attends Religious Services: More than 4 times per year  ? Active Member of Clubs or Organizations: No  ? Attends Archivist Meetings: Never  ? Marital Status: Never married  ?  ? ?Family History:  The patient's  family history includes Arthritis in her father; Cancer in her maternal uncle; Congestive Heart Failure in her paternal grandfather and paternal grandmother;  Congestive Heart Failure (age of onset: 62) in her father; Dementia in her maternal grandmother; Diabetes in her mother; Hypertension in her mother.  ? ?ROS:   ?Please see the history of present illness.    ?ROS Al

## 2021-08-24 ENCOUNTER — Ambulatory Visit (HOSPITAL_COMMUNITY): Admission: RE | Admit: 2021-08-24 | Payer: Medicaid Other | Source: Ambulatory Visit

## 2021-08-24 ENCOUNTER — Ambulatory Visit: Payer: Medicaid Other | Admitting: Physician Assistant

## 2021-08-24 ENCOUNTER — Encounter: Payer: Self-pay | Admitting: Physician Assistant

## 2021-08-24 ENCOUNTER — Ambulatory Visit (HOSPITAL_COMMUNITY): Payer: Medicaid Other | Admitting: Physical Therapy

## 2021-08-24 VITALS — BP 130/70 | HR 82 | Ht 63.0 in | Wt 156.6 lb

## 2021-08-24 DIAGNOSIS — I773 Arterial fibromuscular dysplasia: Secondary | ICD-10-CM

## 2021-08-24 DIAGNOSIS — I10 Essential (primary) hypertension: Secondary | ICD-10-CM

## 2021-08-24 DIAGNOSIS — I428 Other cardiomyopathies: Secondary | ICD-10-CM

## 2021-08-24 DIAGNOSIS — E785 Hyperlipidemia, unspecified: Secondary | ICD-10-CM

## 2021-08-24 DIAGNOSIS — I2510 Atherosclerotic heart disease of native coronary artery without angina pectoris: Secondary | ICD-10-CM | POA: Diagnosis not present

## 2021-08-24 DIAGNOSIS — I340 Nonrheumatic mitral (valve) insufficiency: Secondary | ICD-10-CM

## 2021-08-24 NOTE — Patient Instructions (Signed)
Medication Instructions:  ?Your physician recommends that you continue on your current medications as directed. Please refer to the Current Medication list given to you today. ? ?*If you need a refill on your cardiac medications before your next appointment, please call your pharmacy* ? ? ?Lab Work: ?Your physician recommends that you return for lab work in: Just before the [place] of the brain.  ? ?If you have labs (blood work) drawn today and your tests are completely normal, you will receive your results only by: ?MyChart Message (if you have MyChart) OR ?A paper copy in the mail ?If you have any lab test that is abnormal or we need to change your treatment, we will call you to review the results. ? ? ?Testing/Procedures: ?MRA of the Brain  ? ?Your physician has requested that you have a carotid duplex. This test is an ultrasound of the carotid arteries in your neck. It looks at blood flow through these arteries that supply the brain with blood. Allow one hour for this exam. There are no restrictions or special instructions. ? ? ? ?Follow-Up: ?At [facility], you and your health needs are our priority.  As part of our continuing mission to provide you with exceptional heart care, we have created designated Provider Care Teams.  These Care Teams include your primary Cardiologist (physician) and Advanced Practice Providers (APPs -  Physician Assistants and Nurse Practitioners) who all work together to provide you with the care you need, when you need it. ? ?We recommend signing up for the patient portal called "MyChart".  Sign up information is provided on this After Visit Summary.  MyChart is used to connect with patients for Virtual Visits (Telemedicine).  Patients are able to view lab/test results, encounter notes, upcoming appointments, etc.  Non-urgent messages can be sent to your provider as well.   ?To learn more about what you can do with MyChart, go to [email or web address].   ? ?Your next appointment:    ?1 year(s) ? ?The format for your next appointment:   ?In Person ? ?Provider:   ?Dorris Carnes, MD  ? ? ?Other Instructions ?Thank you for choosing [place]! ? ? ? ?Important Information About Sugar ? ? ? ? ?  ?

## 2021-08-26 ENCOUNTER — Ambulatory Visit (HOSPITAL_COMMUNITY): Payer: Medicaid Other | Admitting: Physical Therapy

## 2021-08-26 ENCOUNTER — Encounter (HOSPITAL_COMMUNITY): Payer: Self-pay | Admitting: Physical Therapy

## 2021-08-26 DIAGNOSIS — R2689 Other abnormalities of gait and mobility: Secondary | ICD-10-CM

## 2021-08-26 DIAGNOSIS — M25571 Pain in right ankle and joints of right foot: Secondary | ICD-10-CM | POA: Diagnosis not present

## 2021-08-26 DIAGNOSIS — R29898 Other symptoms and signs involving the musculoskeletal system: Secondary | ICD-10-CM

## 2021-08-26 NOTE — Therapy (Signed)
OUTPATIENT PHYSICAL THERAPY TREATMENT NOTE   Patient Name: NICKOL COLLISTER MRN: [id number] DOB:18-May-1970, 51 y.o., female Today's Date: 08/26/2021  PCP: Claire Shown, NP REFERRING PROVIDER: Claire Shown, NP  END OF SESSION:   PT End of Session - 08/26/21 0911     Visit Number 4    Number of Visits 12    Date for PT Re-Evaluation 08/31/21    Authorization Type Medicaid [employer]    Authorization Time Period 12 visits approved 4/20-5/24/23    Authorization - Visit Number 4    Authorization - Number of Visits 12    PT Start Time 0908    PT Stop Time 0946    PT Time Calculation (min) 38 min    Activity Tolerance Patient tolerated treatment well    Behavior During Therapy [facility] for tasks assessed/performed             Past Medical History:  Diagnosis Date   Asthma dx 06/04/07   ventolin, singulair   Blood transfusion without reported diagnosis    CHF (congestive heart failure) ([place]) 03/19/2008   a. 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 normalized by repeat imaging and at 55-60% in 04/2019,   DCM (dilated cardiomyopathy) ([place]) dx 06/06/07   EF 10-15%   Degeneration of spine    DJD (degenerative joint disease), lumbar 11/16/2016   chronic lumbar pain   GERD (gastroesophageal reflux disease)    takes Nexium Daily   Goiter 09/09/2020   Hypertension    Seasonal allergies    takes zyrtec   Past Surgical History:  Procedure Laterality Date   ABDOMINAL HYSTERECTOMY     fibroids; partial- ovaries intact   BIOPSY  02/17/2021   Procedure: BIOPSY;  Surgeon: [name], MD;  [facility];  Service: Endoscopy;;   CERVICAL SPINE SURGERY  2002   [facility], ruptured disc   CHOLECYSTECTOMY  2001   laparoscopic, [facility]   COLONOSCOPY WITH PROPOFOL N/A 07/12/2017   tubular adenoma (10 mm polyp in cecum), segmental biopsies negative. 5 year surveillance   ESOPHAGOGASTRODUODENOSCOPY (EGD) WITH ESOPHAGEAL DILATION N/A 09/05/2012   ZPH:XTAVW  hiatal hernia s/p esophageal dilation with 55 F Maloney.    ESOPHAGOGASTRODUODENOSCOPY (EGD) WITH PROPOFOL N/A 07/12/2017   normal esophagus s/p dilation, normal duodenal bulb and second portion of duodenum   ESOPHAGOGASTRODUODENOSCOPY (EGD) WITH PROPOFOL N/A 02/17/2021   Procedure: ESOPHAGOGASTRODUODENOSCOPY (EGD) WITH PROPOFOL;  Surgeon: [name], MD;  [facility];  Service: Endoscopy;  Laterality: N/A;  11:30am   LAPAROSCOPIC BILATERAL SALPINGO OOPHERECTOMY Bilateral 12/15/2020   Procedure: LAPAROSCOPIC BILATERAL SALPINGO OOPHORECTOMY;  Surgeon: [name], MD;  Location: [facility];  Service: Gynecology;  Laterality: Bilateral;   MALONEY DILATION N/A 07/12/2017   Procedure: [name] DILATION;  Surgeon: [name], MD;  [facility];  Service: Endoscopy;  Laterality: N/A;   MALONEY DILATION N/A 02/17/2021   Procedure: [name] DILATION;  Surgeon: [name], MD;  [facility];  Service: Endoscopy;  Laterality: N/A;   POLYPECTOMY  07/12/2017   Procedure: POLYPECTOMY;  Surgeon: [name], MD;  [facility];  Service: Endoscopy;;  Cecal polyp (HS)   SPINE SURGERY     TUBAL LIGATION     VAGINAL HYSTERECTOMY  10/19/2010   Procedure: HYSTERECTOMY VAGINAL;  Surgeon: [name], MD;  Location: [facility];  Service: Gynecology;  Laterality: N/A;   Patient Active Problem List   Diagnosis Date Noted  Chronic right shoulder pain 05/19/2021   Encounter for screening fecal occult blood testing 11/02/2020   Encounter for well woman exam with routine gynecological exam 11/02/2020   Pelvic pain in female 11/02/2020   Enlarged thyroid 11/02/2020   Multinodular goiter 09/23/2020   Goiter 09/09/2020   Screening due 09/09/2020   Left shoulder pain 09/09/2020   Encounter to establish care 10/31/2019   Tubular adenoma of colon 07/16/2017   Diarrhea 05/23/2017   Personal history of noncompliance with medical treatment 04/05/2017   CAD in native  artery 02/21/2017   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 11/16/2016   Hypertension 11/16/2016   Seasonal allergies 11/16/2016   Asthma in adult, mild persistent, uncomplicated 09/38/1829   Chronic lower back pain 11/16/2016   H/O cervical spine surgery 11/16/2016   GERD (gastroesophageal reflux disease) 08/13/2012   Dysphagia 08/13/2012   Constipation 07/11/2012    REFERRING DIAG:  M25.571,M25.572 (ICD-10-CM) - Acute bilateral ankle pain   THERAPY DIAG:  Bilateral ankle pain, unspecified chronicity  Other abnormalities of gait and mobility  Other symptoms and signs involving the musculoskeletal system  PERTINENT HISTORY: Chronic ankle pain, chronic back pain  PRECAUTIONS: none  SUBJECTIVE: Patient says things are going well. No pain right now, but notes some ongoing swelling.   PAIN:  Are you having pain? No  *all italicized information from evaluation    OBJECTIVE:    DIAGNOSTIC FINDINGS: IMPRESSION:No significant osteoarthritis. Minimal distal malleolar soft tissue swelling. No acute abnormality.     COGNITION:           Overall cognitive status: Within functional limits for tasks assessed                          SENSATION: WFL     POSTURE:  WNL   PALPATION: TTP R ankle talocrual joint, L achilles tendon proximally; hypomobile talocrual AP bilaterally with greater symptoms on R   LE ROM: stating tightness throughout   Active ROM Right 07/20/2021 Left 07/20/2021 Right 07/27/21  Hip flexion       Hip extension       Hip abduction       Hip adduction       Hip internal rotation       Hip external rotation       Knee flexion       Knee extension       Ankle dorsiflexion 0 0 8  Ankle plantarflexion 40 45   Ankle inversion 38 33   Ankle eversion 8 9    (Blank rows = not tested)   LE MMT:   MMT Right 07/20/2021 Left 07/20/2021  Hip flexion      Hip extension      Hip abduction      Hip adduction      Hip internal rotation      Hip external  rotation      Knee flexion 5/5 5/5  Knee extension 5/5 5/5  Ankle dorsiflexion 4+/5 * 5/5  Ankle plantarflexion      Ankle inversion 4+/5 4+/5  Ankle eversion 4+/5 4+/5   (Blank rows = not tested) *=pain   FUNCTIONAL TESTS:  2 minute walk test: 330 feet Stairs - 7 inch - 4 RT, tightness throughout bilateral feet and ankles/soreness after first round in anterior ankle  SLS: R:7 seconds L: 10 seconds without UE support, min/mod sway, decreased medial arch in standing   GAIT: Distance walked: 330 feet Assistive device utilized:  None Level of assistance: Complete Independence Comments: 2MWT, slightly slow cadence       TODAY'S TREATMENT:    08/26/21 BAPS board level 3 DF/ PF/ CW and CCW x 1' each  Tandem stance on 1/2 foam roll 2 x 30"   Sidestepping over 4 inch box x20  Lateral step up 4 inch box x20    Heel/ toe raises x20    Rocker board PF/ DF 2 min, then 3 x 20" static holds   Slant board 3 x 20"  Rec bike 4 min lv 3 EOS for ankle mobility    08/10/21    Slant board stretch 3 x 20"      Heel/ toe raises x20     BAPS board level 3 DF/ PF/ CW and CCW x 1' each  GTB ankle 3 way PF/INV/ EV x 20 each     Tandem stance on foam 3 x 20"     Sidestepping over 4 inch box x20        PATIENT EDUCATION:  PATIENT EDUCATION:  Education details: Patient educated on exam findings, POC, scope of PT, HEP, and compression garments for edema if needed. Person educated: Patient Education method: Explanation, Demonstration, and Handouts Education comprehension: verbalized understanding, returned demonstration, verbal cues required, and tactile cues required     HOME EXERCISE PROGRAM: 4/12 calf stretch, HR, TR  07/27/21 Access Code: [email or web address]  Exercises - Standing Tandem Balance with Counter Support  - 1 x daily - 7 x weekly - 1 sets - 3 reps - 10 sec hold - Standing Dorsiflexion Self-Mobilization on Step  - 1 x daily - 7 x weekly - 1 sets - 10 reps - 10 sec hold - Seated Calf  Stretch with Strap  - 1 x daily - 7 x weekly - 1 sets - 3 reps - 30 sec hold  08/10/21  Ankle band INV/EV/PF  ASSESSMENT:   CLINICAL IMPRESSION: Patient tolerated session well today with no increased complaint of pain. Patient well challenged with stabilization exercises today. Patient educated on proper form, with demonstrations, and function of all added activity. She shows good return once cued. Balance is improving. Remains limited by pain with prolonged activity, though none note during today's session. Patient will continue to benefit from skilled therapy services to reduce remaining deficits and improve functional ability.      OBJECTIVE IMPAIRMENTS Abnormal gait, decreased activity tolerance, decreased balance, decreased endurance, decreased mobility, difficulty walking, decreased ROM, and decreased strength.    ACTIVITY LIMITATIONS cleaning, community activity, meal prep, occupation, laundry, yard work, and shopping.    PERSONAL FACTORS Fitness, Time since onset of injury/illness/exacerbation, and 1-2 comorbidities: hx ankle sprains, cardiac  are also affecting patient's functional outcome.      REHAB POTENTIAL: Good   CLINICAL DECISION MAKING: Stable/uncomplicated   EVALUATION COMPLEXITY: Low     GOALS: Goals reviewed with patient? No   SHORT TERM GOALS: Target date: 08/10/2021   Patient will be independent with HEP in order to improve functional outcomes. Baseline:  Goal status: ongoing   2.  Patient will report at least 25% improvement in symptoms for improved quality of life. Baseline:  Goal status: ongoing       LONG TERM GOALS: Target date: 08/31/2021   Patient will report at least 75% improvement in symptoms for improved quality of life. Baseline:  Goal status: ongoing   2.  Patient will be able to complete 5x STS in under 11.4 seconds in order to reduce  the risk of falls. Baseline:  Goal status: ongoing   3.  Patient will be able to ambulate at least 450  feet in 2MWT in order to demonstrate improved gait speed for community ambulation.  Baseline:  Goal status: ongoing   4.  Patient will perform SLS for at least 30 seconds bilaterally in order to demonstrate improved balance to reduce the risk for falls at home and in the community.  Baseline:  Goal status: ongoing   5. Patient will demonstrate improved ankle dorsiflexion bilaterally to at least 10 degrees for improved gait mechanics and stair navigation Baseline:  Goal status: ongoing         PLAN: PT FREQUENCY: 1-2x/week   PT DURATION: 6 weeks   PLANNED INTERVENTIONS: Therapeutic exercises, Therapeutic activity, Neuromuscular re-education, Balance training, Gait training, Patient/Family education, Joint manipulation, Joint mobilization, Stair training, Orthotic/Fit training, DME instructions, Aquatic Therapy, Dry Needling, Electrical stimulation, Spinal manipulation, Spinal mobilization, Cryotherapy, Moist heat, Compression bandaging, scar mobilization, Splintting, Taping, Traction, Ultrasound, Ionotophoresis '4mg'$ /ml Dexamethasone, and Manual therapy   PLAN FOR NEXT SESSION: progress ankle strengthening and mobility as able; progress balance  9:44 AM, 08/26/21 Josue Hector PT DPT  Physical Therapist with [facility]  [phone]

## 2021-08-27 NOTE — Progress Notes (Signed)
Primary Care Physician:  [name], Destiny Gammon, NP  Primary GI: Dr. Gala Manning  Patient Location: Home   Provider Location: Destiny Manning   Reason for Visit: Follow-up, uncontrolled GERD, nausea/vomiting    Persons present on the virtual encounter, with roles: Destiny Altes, PA-C (Provider), Destiny Manning (patient)   Total time (minutes) spent on medical discussion: 13 minutes  Virtual Visit via video note Manning to COVID-19, visit is conducted virtually and was requested by patient.   I connected with Destiny Manning on 08/29/21 at 11:30 AM EDT by video and verified that I am speaking with the correct person using two identifiers.   I discussed the limitations, risks, security and privacy concerns of performing an evaluation and management service by video and the availability of in person appointments. I also discussed with the patient that there may be a patient responsible charge related to this service. The patient expressed understanding and agreed to proceed.  Chief Complaint  Patient presents with   Follow-up    Acid reflux is bad. It makes her feel nauseated. Vomiting sometimes.      History of Present Illness: Destiny Manning is a 51 year old female with history of recurrent esophageal dysphagia s/p empiric dilations in the past, GERD, chronic constipation, adenomatous colon polyps Manning for surveillance colonoscopy in April 2024, presenting today for follow-up with chief complaint of uncontrolled GERD, nausea, vomiting.  Last seen in our Manning 01/11/2021 with chief complaint of recurrent esophageal dysphagia.  GERD was well controlled on Dexilant.  Constipation well managed with Amitiza 8 mcg twice daily.  Recommended EGD.   EGD 02/08/2021: Normal esophagus s/p dilation, normal stomach and examined duodenum.  Today:   Dysphagia: Occasional pain with swallowing liquids. Like a pressure. Has to take her time and let the liquid pass. No food or pill dysphagia.   GERD:  Uncontrolled  with burning in her esophagus. Heartburn is worse in the afternoons/at bedtime. Several days a week. Symptoms started about 3 weeks ago. Taking Dexilant once daily. For the last 2 weeks, she has been feeling very nauseated. Couple episodes of vomiting. Had similar symptoms years ago related to acid reflux. Nausea is daily. No hematemesis or melena.   No abdominal pain.   Was on Prilosec and Nexium years ago.   No cold or flu like symptoms. No fever.   Diarrhea last week for a couple days but resolved. No brbpr.   NSAIDs: None.  New medications: None.  No sick contacts.  Diet: Following GERD diet.   Still taking Amitiza which is working well for chronic constipation. Has been on this for years.   Past Medical History:  Diagnosis Date   Asthma dx 06/04/07   ventolin, singulair   Blood transfusion without reported diagnosis    CHF (congestive heart failure) ([place]) 03/19/2008   a. 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 normalized by repeat imaging and at 55-60% in 04/2019,   DCM (dilated cardiomyopathy) ([place]) dx 06/06/07   EF 10-15%   Degeneration of spine    DJD (degenerative joint disease), lumbar 11/16/2016   chronic lumbar pain   GERD (gastroesophageal reflux disease)    takes Nexium Daily   Goiter 09/09/2020   Hypertension    Seasonal allergies    takes zyrtec     Past Surgical History:  Procedure Laterality Date   ABDOMINAL HYSTERECTOMY     fibroids; partial- ovaries intact   BIOPSY  02/17/2021   Procedure: BIOPSY;  Surgeon: Destiny Dolin,  MD;  [facility];  Service: Endoscopy;;   CERVICAL SPINE SURGERY  2002   Destiny Manning, ruptured disc   CHOLECYSTECTOMY  2001   laparoscopic, [facility]   COLONOSCOPY WITH PROPOFOL N/A 07/12/2017   tubular adenoma (10 mm polyp in cecum), segmental biopsies negative. 5 year surveillance   ESOPHAGOGASTRODUODENOSCOPY (EGD) WITH ESOPHAGEAL DILATION N/A 09/05/2012   IWL:NLGXQ hiatal hernia s/p esophageal dilation with  55 F Maloney.    ESOPHAGOGASTRODUODENOSCOPY (EGD) WITH PROPOFOL N/A 07/12/2017   normal esophagus s/p dilation, normal duodenal bulb and second portion of duodenum   ESOPHAGOGASTRODUODENOSCOPY (EGD) WITH PROPOFOL N/A 02/17/2021   Surgeon: Destiny Dolin, MD;   Normal esophagus s/p dilation, normal stomach and examined duodenum.   LAPAROSCOPIC BILATERAL SALPINGO OOPHERECTOMY Bilateral 12/15/2020   Procedure: LAPAROSCOPIC BILATERAL SALPINGO OOPHORECTOMY;  Surgeon: Destiny Buff, MD;  Location: [facility];  Service: Gynecology;  Laterality: Bilateral;   MALONEY DILATION N/A 07/12/2017   Procedure: Destiny Manning DILATION;  Surgeon: Destiny Dolin, MD;  [facility];  Service: Endoscopy;  Laterality: N/A;   MALONEY DILATION N/A 02/17/2021   Procedure: Destiny Manning DILATION;  Surgeon: Destiny Dolin, MD;  [facility];  Service: Endoscopy;  Laterality: N/A;   POLYPECTOMY  07/12/2017   Procedure: POLYPECTOMY;  Surgeon: Destiny Dolin, MD;  [facility];  Service: Endoscopy;;  Cecal polyp (HS)   SPINE SURGERY     TUBAL LIGATION     VAGINAL HYSTERECTOMY  10/19/2010   Procedure: HYSTERECTOMY VAGINAL;  Surgeon: Destiny Buff, MD;  Location: [facility];  Service: Gynecology;  Laterality: N/A;     Current Meds  Medication Sig   albuterol (VENTOLIN HFA) 108 (90 Base) MCG/ACT inhaler Inhale 2 puffs into the lungs every 6 (six) hours as needed for wheezing or shortness of breath.   atorvastatin (LIPITOR) 10 MG tablet Take 1 tablet (10 mg total) by mouth at bedtime.   baclofen (LIORESAL) 10 MG tablet TAKE 1 TABLET BY MOUTH WITH FOOD OR MILK 3 TIMES DAILY AS NEEDED.   carvedilol (COREG) 25 MG tablet TAKE ONE TABLET BY MOUTH 2 TIMES A DAY   cetirizine (ZYRTEC) 10 MG tablet Take 1 tablet (10 mg total) by mouth daily.   estradiol (ESTRACE) 2 MG tablet Take 1 tablet (2 mg total) by mouth daily.   fluticasone (FLONASE) 50 MCG/ACT nasal spray Place 1 spray into both nostrils daily.    Fluticasone-Salmeterol (ADVAIR) 500-50 MCG/DOSE AEPB Inhale 1 puff into the lungs 2 (two) times daily as needed (respiratory issues.).   gabapentin (NEURONTIN) 800 MG tablet Take 1 tablet (800 mg total) by mouth 3 (three) times daily.   losartan (COZAAR) 100 MG tablet TAKE ONE TABLET BY MOUTH ONCE DAILY   lubiprostone (AMITIZA) 8 MCG capsule TAKE (1) CAPSULE BY MOUTH TWICE DAILY.   montelukast (SINGULAIR) 10 MG tablet Take 1 tablet (10 mg total) by mouth at bedtime.   ondansetron (ZOFRAN) 4 MG tablet Take 1 tablet (4 mg total) by mouth every 8 (eight) hours as needed for nausea or vomiting.   pantoprazole (PROTONIX) 40 MG tablet Take 1 tablet (40 mg total) by mouth daily before breakfast.   SUMAtriptan (IMITREX) 50 MG tablet Take 1 tablet (50 mg total) by mouth 2 (two) times daily as needed for migraine. May repeat in 2 hours if headache persists or recurs.   [DISCONTINUED] dexlansoprazole (DEXILANT) 60 MG capsule Take 1 capsule (60 mg total) by mouth daily.   [DISCONTINUED] ibuprofen (ADVIL) 600 MG  tablet Take 1 tablet (600 mg total) by mouth every 8 (eight) hours as needed.     Family History  Problem Relation Age of Onset   Diabetes Mother    Hypertension Mother    Congestive Heart Failure Father 43   Arthritis Father    Dementia Maternal Grandmother    Congestive Heart Failure Paternal Grandmother    Congestive Heart Failure Paternal Grandfather    Cancer Maternal Uncle    Anesthesia problems Neg Hx    Hypotension Neg Hx    Pseudochol deficiency Neg Hx    Malignant hyperthermia Neg Hx    Colon cancer Neg Hx    Colon polyps Neg Hx     Social History   Socioeconomic History   Marital status: Single    Spouse name: Not on file   Number of children: 2   Years of education: 16   Highest education level: Not on file  Occupational History   Occupation: [employer]    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  Tobacco Use   Smoking status: Former    Packs/day: 0.25    Years:  4.00    Pack years: 1.00    Types: Cigarettes    Quit date: 03/14/2009    Years since quitting: 12.4   Smokeless tobacco: Never  Vaping Use   Vaping Use: Never used  Substance and Sexual Activity   Alcohol use: No   Drug use: No   Sexual activity: Not Currently    Birth control/protection: Surgical    Comment: hyst  Other Topics Concern   Not on file  Social History Narrative   Actuary at [employer]; 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      Lives with 2 children- age 49 and 68; she homeschools them   Social Determinants of Health   Financial Resource Strain: High Risk   Difficulty of Paying Living Expenses: Hard  Food Insecurity: No Food Insecurity   Worried About Charity fundraiser in the Last Year: Never true   Ran Out of Food in the Last Year: Never true  Transportation Needs: No Transportation Needs   Lack of Transportation (Medical): No   Lack of Transportation (Non-Medical): No  Physical Activity: Insufficiently Active   Days of Exercise per Week: 1 day   Minutes of Exercise per Session: 20 min  Stress: No Stress Concern Present   Feeling of Stress : Not at all  Social Connections: Socially Isolated   Frequency of Communication with Friends and Family: Once a week   Frequency of Social Gatherings with Friends and Family: Never   Attends Religious Services: More than 4 times per year   Active Member of [employer] or Organizations: No   Attends Archivist Meetings: Never   Marital Status: Never married       Review of Systems: Gen: Denies fever, chills, cold or flu like symptoms, pre-syncope, or syncope.  CV: Denies chest pain, palpitations. Resp: Denies dyspnea, cough.  GI: see HPI Heme: See HPI  Observations/Objective: No distress. Alert and oriented. Pleasant. Well nourished. Normal mood and affect. Unable to perform complete physical exam Manning to video encounter.    Assessment:  51 year old female with history of esophageal dysphagia s/p empiric  dilations, GERD, chronic constipation, adenomatous colon polyps Manning for surveillance colonoscopy in April 2024, presenting today with chief complaint of uncontrolled GERD, nausea, vomiting.  GERD/nausea/vomiting: GERD previously well controlled on Dexilant 60 mg daily.  About 3 weeks ago, she began experiencing frequent breakthrough symptoms  without known provoking factors.  Denies dietary changes, new medications, NSAIDs, recent acute illness.  She has had associated daily nausea for the last 2 weeks and a couple episodes of vomiting without hematemesis.  Reports similar symptoms in the past related to uncontrolled GERD. Denies BRBPR or melena.  Recent EGD in November 2022 with entirely normal exam.  We will try changing PPI and monitor for response.  She has previously failed Prilosec and Nexium.  We will try Protonix.  Dysphagia: Underwent EGD in November 2022 Manning to reports of recurrent esophageal dysphagia revealing normal esophagus s/p dilation.  Patient reports she continues to have intermittent pain/pressure when swallowing liquids, but no food or pill dysphagia.  Query underlying esophageal motility disorder/esophageal spasm.  Symptoms could be related to uncontrolled GERD.  We will focus on GERD management as per above.  If persistent symptoms, will need to consider additional evaluation.  Chronic constipation: Well-controlled on Amitiza 8 mcg twice daily.    Plan: Stop Dexilant and start Protonix 40 mg daily.  Requested progress report in 2-3 weeks.  Can increase to twice daily if needed. Zofran 4 mg every 8 hours as needed for nausea/vomiting. Reinforced GERD diet/lifestyle.  Separate written instructions provided. Continue Amitiza 8 mcg twice daily with a meal. Follow-up in 3 months. Patient prefers virtual visit.      I discussed the assessment and treatment plan with the patient. The patient was provided an opportunity to ask questions and all were answered. The patient agreed  with the plan and demonstrated an understanding of the instructions.   The patient was advised to call back or seek an in-person evaluation if the symptoms worsen or if the condition fails to improve as anticipated.  I provided 13 minutes of video-face-to-face time during this encounter.  Destiny Altes, PA-C [facility]  08/29/2021

## 2021-08-29 ENCOUNTER — Telehealth: Payer: Self-pay | Admitting: *Deleted

## 2021-08-29 ENCOUNTER — Telehealth (INDEPENDENT_AMBULATORY_CARE_PROVIDER_SITE_OTHER): Payer: Medicaid Other | Admitting: Gastroenterology

## 2021-08-29 ENCOUNTER — Encounter: Payer: Self-pay | Admitting: Gastroenterology

## 2021-08-29 VITALS — Ht 63.0 in | Wt 156.0 lb

## 2021-08-29 DIAGNOSIS — R112 Nausea with vomiting, unspecified: Secondary | ICD-10-CM | POA: Insufficient documentation

## 2021-08-29 DIAGNOSIS — K5900 Constipation, unspecified: Secondary | ICD-10-CM | POA: Diagnosis not present

## 2021-08-29 DIAGNOSIS — R1310 Dysphagia, unspecified: Secondary | ICD-10-CM

## 2021-08-29 DIAGNOSIS — K219 Gastro-esophageal reflux disease without esophagitis: Secondary | ICD-10-CM

## 2021-08-29 MED ORDER — ONDANSETRON HCL 4 MG PO TABS: 4.0000 mg | ORAL_TABLET | Freq: Three times a day (TID) | ORAL | 1 refills | Status: DC | PRN

## 2021-08-29 MED ORDER — PANTOPRAZOLE SODIUM 40 MG PO TBEC: 40.0000 mg | DELAYED_RELEASE_TABLET | Freq: Every day | ORAL | 3 refills | Status: DC

## 2021-08-29 NOTE — Telephone Encounter (Signed)
Pt consented to a virtual visit. 

## 2021-08-29 NOTE — Patient Instructions (Addendum)
Stop Dexilant and start Protonix 40 g daily 30 minutes before breakfast.  I have sent a prescription to your pharmacy.  Start Zofran 4 mg every 8 hours as needed for nausea/vomiting.  Follow a GERD diet:  Avoid fried, fatty, greasy, spicy, citrus foods. Avoid caffeine and carbonated beverages. Avoid chocolate. Try eating 4-6 small meals a day rather than 3 large meals. Do not eat within 3 hours of laying down. Prop head of bed up on wood or bricks to create a 6 inch incline.  Continue Amitiza 8 mcg twice daily with a meal for constipation.  Please call with a progress report in 2 to 3 weeks to let me know how your reflux and nausea are doing with the change of medication.  We can increase the dose of Protonix if needed.  We will plan for follow-up in 3 months.  Do not hesitate to call if you have any questions or concerns prior to your next visit.  It was very nice meeting you today!  Aliene Altes, PA-C [facility]

## 2021-08-29 NOTE — Telephone Encounter (Signed)
[name], you are scheduled for a virtual visit with your provider today.  Just as we do with appointments in the office, we must obtain your consent to participate.  Your consent will be active for this visit and any virtual visit you may have with one of our providers in the next 365 days.  If you have a MyChart account, I can also send a copy of this consent to you electronically.  All virtual visits are billed to your insurance company just like a traditional visit in the office.  As this is a virtual visit, video technology does not allow for your provider to perform a traditional examination.  This may limit your provider's ability to fully assess your condition.  If your provider identifies any concerns that need to be evaluated in person or the need to arrange testing such as labs, EKG, etc, we will make arrangements to do so.  Although advances in technology are sophisticated, we cannot ensure that it will always work on either your end or our end.  If the connection with a video visit is poor, we may have to switch to a telephone visit.  With either a video or telephone visit, we are not always able to ensure that we have a secure connection.   I need to obtain your verbal consent now.   Are you willing to proceed with your visit today?

## 2021-08-31 ENCOUNTER — Ambulatory Visit (HOSPITAL_COMMUNITY): Payer: Medicaid Other | Admitting: Physical Therapy

## 2021-08-31 ENCOUNTER — Encounter (HOSPITAL_COMMUNITY): Payer: Self-pay | Admitting: Physical Therapy

## 2021-08-31 DIAGNOSIS — M25571 Pain in right ankle and joints of right foot: Secondary | ICD-10-CM | POA: Diagnosis not present

## 2021-08-31 DIAGNOSIS — M25572 Pain in left ankle and joints of left foot: Secondary | ICD-10-CM

## 2021-08-31 DIAGNOSIS — R29898 Other symptoms and signs involving the musculoskeletal system: Secondary | ICD-10-CM

## 2021-08-31 DIAGNOSIS — R2689 Other abnormalities of gait and mobility: Secondary | ICD-10-CM

## 2021-08-31 NOTE — Therapy (Signed)
OUTPATIENT PHYSICAL THERAPY TREATMENT NOTE   Patient Name: Destiny Manning MRN: [id number] DOB:[birth year], 51 y.o., female Today's Date: 08/31/2021  PCP: Deneise Lever, NP REFERRING PROVIDER: [name], Alvino Chapel, NP  PHYSICAL THERAPY DISCHARGE SUMMARY  Visits from Start of Care: 5  Current functional level related to goals / functional outcomes: See below   Remaining deficits: See below   Education / Equipment: See below   Patient agrees to discharge. Patient goals were met. Patient is being discharged due to being pleased with the current functional level.   END OF SESSION:   PT End of Session - 08/31/21 1320     Visit Number 5    Number of Visits 12    Date for PT Re-Evaluation 08/31/21    Authorization Type Medicaid [employer]    Authorization Time Period 12 visits approved 4/20-5/24/23    Authorization - Visit Number 5    Authorization - Number of Visits 12    PT Start Time 1320    PT Stop Time 1340    PT Time Calculation (min) 20 min    Activity Tolerance Patient tolerated treatment well    Behavior During Therapy WFL for tasks assessed/performed             Past Medical History:  Diagnosis Date   Asthma dx 06/04/07   ventolin, singulair   Blood transfusion without reported diagnosis    CHF (congestive heart failure) (HCC) 03/19/2008   a. 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 normalized by repeat imaging and at 55-60% in 04/2019,   DCM (dilated cardiomyopathy) (HCC) dx 06/06/07   EF 10-15%   Degeneration of spine    DJD (degenerative joint disease), lumbar 11/16/2016   chronic lumbar pain   GERD (gastroesophageal reflux disease)    takes Nexium Daily   Goiter 09/09/2020   Hypertension    Seasonal allergies    takes zyrtec   Past Surgical History:  Procedure Laterality Date   ABDOMINAL HYSTERECTOMY     fibroids; partial- ovaries intact   BIOPSY  02/17/2021   Procedure: BIOPSY;  Surgeon: Corbin Ade, MD;  [facility]:  [facility];  Service: Endoscopy;;   CERVICAL SPINE SURGERY  2002   [facility], ruptured disc   CHOLECYSTECTOMY  2001   laparoscopic, [facility]   COLONOSCOPY WITH PROPOFOL N/A 07/12/2017   tubular adenoma (10 mm polyp in cecum), segmental biopsies negative. 5 year surveillance   ESOPHAGOGASTRODUODENOSCOPY (EGD) WITH ESOPHAGEAL DILATION N/A 09/05/2012   VDF:PBHEB hiatal hernia s/p esophageal dilation with [address].    ESOPHAGOGASTRODUODENOSCOPY (EGD) WITH PROPOFOL N/A 07/12/2017   normal esophagus s/p dilation, normal duodenal bulb and second portion of duodenum   ESOPHAGOGASTRODUODENOSCOPY (EGD) WITH PROPOFOL N/A 02/17/2021   Surgeon: Corbin Ade, MD;   Normal esophagus s/p dilation, normal stomach and examined duodenum.   LAPAROSCOPIC BILATERAL SALPINGO OOPHERECTOMY Bilateral 12/15/2020   Procedure: LAPAROSCOPIC BILATERAL SALPINGO OOPHORECTOMY;  Surgeon: Lazaro Arms, MD;  Location: [facility];  Service: Gynecology;  Laterality: Bilateral;   MALONEY DILATION N/A 07/12/2017   Procedure: Elease Hashimoto DILATION;  Surgeon: Corbin Ade, MD;  Location: [facility];  Service: Endoscopy;  Laterality: N/A;   MALONEY DILATION N/A 02/17/2021   Procedure: Elease Hashimoto DILATION;  Surgeon: Corbin Ade, MD;  [facility]: [facility];  Service: Endoscopy;  Laterality: N/A;   POLYPECTOMY  07/12/2017   Procedure: POLYPECTOMY;  Surgeon: Corbin Ade, MD;  [facility]: [facility];  Service: Endoscopy;;  Cecal polyp (HS)   SPINE SURGERY     TUBAL LIGATION     VAGINAL HYSTERECTOMY  10/19/2010   Procedure: HYSTERECTOMY VAGINAL;  Surgeon: [name], MD;  Location: [facility];  Service: Gynecology;  Laterality: N/A;   Patient Active Problem List   Diagnosis Date Noted   Nausea and vomiting 08/29/2021   Chronic right shoulder pain 05/19/2021   Encounter for screening fecal occult blood testing 11/02/2020   Encounter for well woman exam with routine gynecological exam 11/02/2020   Pelvic pain in female  11/02/2020   Enlarged thyroid 11/02/2020   Multinodular goiter 09/23/2020   Goiter 09/09/2020   Screening due 09/09/2020   Left shoulder pain 09/09/2020   Encounter to establish care 10/31/2019   Tubular adenoma of colon 07/16/2017   Diarrhea 05/23/2017   Personal history of noncompliance with medical treatment 04/05/2017   CAD in native artery 02/21/2017   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 11/16/2016   Hypertension 11/16/2016   Seasonal allergies 11/16/2016   Asthma in adult, mild persistent, uncomplicated 19/62/2297   Chronic lower back pain 11/16/2016   H/O cervical spine surgery 11/16/2016   Gastroesophageal reflux disease 08/13/2012   Odynophagia 08/13/2012   Constipation 07/11/2012    REFERRING DIAG:  M25.571,M25.572 (ICD-10-CM) - Acute bilateral ankle pain   THERAPY DIAG:  Bilateral ankle pain, unspecified chronicity  Other abnormalities of gait and mobility  Other symptoms and signs involving the musculoskeletal system  PERTINENT HISTORY: Chronic ankle pain, chronic back pain  PRECAUTIONS: none  SUBJECTIVE: Patient states ankles are going good. Patient states 90% improvement since beginning therapy. Still have issues with symptoms after sitting, intermittent foot pain.   PAIN:  Are you having pain? No  *all italicized information from evaluation    OBJECTIVE:    DIAGNOSTIC FINDINGS: IMPRESSION:No significant osteoarthritis. Minimal distal malleolar soft tissue swelling. No acute abnormality.     COGNITION:           Overall cognitive status: Within functional limits for tasks assessed                          SENSATION: WFL     POSTURE:  WNL   PALPATION: TTP R ankle talocrual joint, L achilles tendon proximally; hypomobile talocrual AP bilaterally with greater symptoms on R   LE ROM: stating tightness throughout   Active ROM Right 07/20/2021 Left 07/20/2021 Right 07/27/21 Right  08/31/21 Left  08/31/21  Hip flexion         Hip extension          Hip abduction         Hip adduction         Hip internal rotation         Hip external rotation         Knee flexion         Knee extension         Ankle dorsiflexion 0 0 $R'8 11 9  'cx$ Ankle plantarflexion 40 45     Ankle inversion 38 33     Ankle eversion 8 9      (Blank rows = not tested)   LE MMT:   MMT Right 07/20/2021 Left 07/20/2021 Right 08/31/21 Left  08/31/21  Hip flexion        Hip extension        Hip abduction        Hip adduction        Hip internal  rotation        Hip external rotation        Knee flexion 5/5 5/5    Knee extension 5/5 5/5    Ankle dorsiflexion 4+/5 * 5/5 5/5 5/5  Ankle plantarflexion        Ankle inversion 4+/5 4+/5 5/5 5/5  Ankle eversion 4+/5 4+/5 5/5 5/5   (Blank rows = not tested) *=pain  GAIT: Distance walked: 330 feet Assistive device utilized: None Level of assistance: Complete Independence Comments: 2MWT, slightly slow cadence   FUNCTIONAL TESTS:  2 minute walk test: 330 feet Stairs - 7 inch - 4 RT, tightness throughout bilateral feet and ankles/soreness after first round in anterior ankle  SLS: R:7 seconds L: 10 seconds without UE support, min/mod sway, decreased medial arch in standing  08/31/21 2 minute walk test: 405 feet 5x STS: 14.06 seconds without UE support Stairs - 7 inch - 4 RT: WFL without UE support SLS: R:30 seconds L: 30 seconds without UE support      TODAY'S TREATMENT:    08/31/21 Reassessment   08/26/21 BAPS board level 3 DF/ PF/ CW and CCW x 1' each  Tandem stance on 1/2 foam roll 2 x 30"   Sidestepping over 4 inch box x20  Lateral step up 4 inch box x20    Heel/ toe raises x20    Rocker board PF/ DF 2 min, then 3 x 20" static holds   Slant board 3 x 20"  Rec bike 4 min lv 3 EOS for ankle mobility    08/10/21    Slant board stretch 3 x 20"      Heel/ toe raises x20     BAPS board level 3 DF/ PF/ CW and CCW x 1' each  GTB ankle 3 way PF/INV/ EV x 20 each     Tandem stance on foam 3 x 20"      Sidestepping over 4 inch box x20        PATIENT EDUCATION:  PATIENT EDUCATION:  Education details: Patient educated on exam findings, POC, scope of PT, HEP, and compression garments for edema if needed. Person educated: Patient Education method: Explanation, Demonstration, and Handouts Education comprehension: verbalized understanding, returned demonstration, verbal cues required, and tactile cues required     HOME EXERCISE PROGRAM: 4/12 calf stretch, HR, TR  07/27/21 Access Code: [email or web address]  Exercises - Standing Tandem Balance with Counter Support  - 1 x daily - 7 x weekly - 1 sets - 3 reps - 10 sec hold - Standing Dorsiflexion Self-Mobilization on Step  - 1 x daily - 7 x weekly - 1 sets - 10 reps - 10 sec hold - Seated Calf Stretch with Strap  - 1 x daily - 7 x weekly - 1 sets - 3 reps - 30 sec hold  08/10/21  Ankle band INV/EV/PF  ASSESSMENT:   CLINICAL IMPRESSION: Patient has met 2/2 short term goals and 3/5 long term goals with ability to complete HEP and improvements in symptoms, ROM, balance, strength, and functional mobility. Patient remains limited by decreased gait speed and functional strength and slight ankle dorsiflexion limitation. Patient without symptoms at this time and feels ready to transition to HEP. Patient discharged from physical therapy at this time.       OBJECTIVE IMPAIRMENTS Abnormal gait, decreased activity tolerance, decreased balance, decreased endurance, decreased mobility, difficulty walking, decreased ROM, and decreased strength.    ACTIVITY LIMITATIONS cleaning, community activity, meal prep, occupation, laundry, yard work, and  shopping.    PERSONAL FACTORS Fitness, Time since onset of injury/illness/exacerbation, and 1-2 comorbidities: hx ankle sprains, cardiac  are also affecting patient's functional outcome.      REHAB POTENTIAL: Good   CLINICAL DECISION MAKING: Stable/uncomplicated   EVALUATION COMPLEXITY: Low     GOALS: Goals  reviewed with patient? YES   SHORT TERM GOALS: Target date: 08/10/2021   Patient will be independent with HEP in order to improve functional outcomes. Baseline:  Goal status: MET   2.  Patient will report at least 25% improvement in symptoms for improved quality of life. Baseline:  Goal status: MET     LONG TERM GOALS: Target date: 08/31/2021   Patient will report at least 75% improvement in symptoms for improved quality of life. Baseline:  Goal status: MET   2.  Patient will be able to complete 5x STS in under 11.4 seconds in order to reduce the risk of falls. Baseline: 08/31/21 14.06 Goal status: NOT met   3.  Patient will be able to ambulate at least 450 feet in 2MWT in order to demonstrate improved gait speed for community ambulation.  Baseline: 405 feet Goal status: not met   4.  Patient will perform SLS for at least 30 seconds bilaterally in order to demonstrate improved balance to reduce the risk for falls at home and in the community.  Baseline:  Goal status: MET   5. Patient will demonstrate improved ankle dorsiflexion bilaterally to at least 10 degrees for improved gait mechanics and stair navigation Baseline:  Goal status: Partially met         PLAN: PT FREQUENCY: 1-2x/week   PT DURATION: 6 weeks   PLANNED INTERVENTIONS: Therapeutic exercises, Therapeutic activity, Neuromuscular re-education, Balance training, Gait training, Patient/Family education, Joint manipulation, Joint mobilization, Stair training, Orthotic/Fit training, DME instructions, Aquatic Therapy, Dry Needling, Electrical stimulation, Spinal manipulation, Spinal mobilization, Cryotherapy, Moist heat, Compression bandaging, scar mobilization, Splintting, Taping, Traction, Ultrasound, Ionotophoresis 4mg /ml Dexamethasone, and Manual therapy   PLAN FOR NEXT SESSION: n/a  1:47 PM, 08/31/21 Mearl Latin PT, DPT Physical Therapist at [facility]

## 2021-09-07 ENCOUNTER — Other Ambulatory Visit: Payer: Self-pay | Admitting: Internal Medicine

## 2021-09-19 ENCOUNTER — Ambulatory Visit (HOSPITAL_COMMUNITY)
Admission: RE | Admit: 2021-09-19 | Discharge: 2021-09-19 | Disposition: A | Discharge status: Home / Self Care | Payer: Medicaid Other | Source: Ambulatory Visit | Attending: Physician Assistant | Admitting: Physician Assistant

## 2021-09-19 ENCOUNTER — Other Ambulatory Visit: Payer: Self-pay | Admitting: Physician Assistant

## 2021-09-19 DIAGNOSIS — I773 Arterial fibromuscular dysplasia: Secondary | ICD-10-CM | POA: Diagnosis present

## 2021-09-20 ENCOUNTER — Other Ambulatory Visit: Payer: Self-pay | Admitting: "Endocrinology

## 2021-09-20 DIAGNOSIS — E042 Nontoxic multinodular goiter: Secondary | ICD-10-CM

## 2021-09-21 ENCOUNTER — Ambulatory Visit (INDEPENDENT_AMBULATORY_CARE_PROVIDER_SITE_OTHER): Payer: Medicaid Other | Admitting: Nurse Practitioner

## 2021-09-21 ENCOUNTER — Encounter: Payer: Self-pay | Admitting: Nurse Practitioner

## 2021-09-21 VITALS — BP 102/61 | HR 82 | Temp 97.2°F | Ht 63.0 in | Wt 154.0 lb

## 2021-09-21 DIAGNOSIS — E785 Hyperlipidemia, unspecified: Secondary | ICD-10-CM | POA: Diagnosis not present

## 2021-09-21 DIAGNOSIS — G43909 Migraine, unspecified, not intractable, without status migrainosus: Secondary | ICD-10-CM | POA: Insufficient documentation

## 2021-09-21 DIAGNOSIS — M545 Low back pain, unspecified: Secondary | ICD-10-CM

## 2021-09-21 DIAGNOSIS — G8929 Other chronic pain: Secondary | ICD-10-CM

## 2021-09-21 DIAGNOSIS — Z90710 Acquired absence of both cervix and uterus: Secondary | ICD-10-CM | POA: Insufficient documentation

## 2021-09-21 DIAGNOSIS — M5030 Other cervical disc degeneration, unspecified cervical region: Secondary | ICD-10-CM | POA: Insufficient documentation

## 2021-09-21 DIAGNOSIS — Z0000 Encounter for general adult medical examination without abnormal findings: Secondary | ICD-10-CM

## 2021-09-21 DIAGNOSIS — M25571 Pain in right ankle and joints of right foot: Secondary | ICD-10-CM

## 2021-09-21 DIAGNOSIS — M25511 Pain in right shoulder: Secondary | ICD-10-CM

## 2021-09-21 DIAGNOSIS — M25572 Pain in left ankle and joints of left foot: Secondary | ICD-10-CM

## 2021-09-21 DIAGNOSIS — R011 Cardiac murmur, unspecified: Secondary | ICD-10-CM

## 2021-09-21 DIAGNOSIS — B351 Tinea unguium: Secondary | ICD-10-CM | POA: Insufficient documentation

## 2021-09-21 DIAGNOSIS — M5430 Sciatica, unspecified side: Secondary | ICD-10-CM

## 2021-09-21 DIAGNOSIS — Z6825 Body mass index (BMI) 25.0-25.9, adult: Secondary | ICD-10-CM | POA: Insufficient documentation

## 2021-09-21 HISTORY — DX: Hyperlipidemia, unspecified: E78.5

## 2021-09-21 HISTORY — DX: Sciatica, unspecified side: M54.30

## 2021-09-21 MED ORDER — BACLOFEN 10 MG PO TABS
10.0000 mg | ORAL_TABLET | Freq: Three times a day (TID) | ORAL | 2 refills | Status: DC
Start: 2021-09-21 — End: 2021-12-28

## 2021-09-21 MED ORDER — GABAPENTIN 800 MG PO TABS: 800.0000 mg | ORAL_TABLET | Freq: Three times a day (TID) | ORAL | 1 refills | Status: DC

## 2021-09-21 NOTE — Progress Notes (Signed)
Subjective:    Patient ID: Destiny Manning, female    DOB: 1970-04-30, 51 y.o.   MRN: [id number]  HPI The patient comes in today for a wellness visit.    A review of their health history was completed.  A review of medications was also completed.  Any needed refills; baclofen and gabapentin  Eating habits: good  Falls/  MVA accidents in past few months: no  Regular exercise: aerobic and active life  Specialist pt sees on regular basis: cardio, gyn, GI   Preventative health issues were discussed.   Additional concerns:no  Review of Systems  All other systems reviewed and are negative.      Objective:   Physical Exam Vitals reviewed.  Constitutional:      General: She is not in acute distress.    Appearance: Normal appearance. She is normal weight. She is not ill-appearing, toxic-appearing or diaphoretic.  HENT:     Head: Normocephalic and atraumatic.  Cardiovascular:     Rate and Rhythm: Normal rate and regular rhythm.     Pulses: Normal pulses.     Heart sounds: Murmur heard.     Comments: Grade 1 or 2 murmur auscultated. Pulmonary:     Effort: Pulmonary effort is normal. No respiratory distress.     Breath sounds: Normal breath sounds. No wheezing.  Musculoskeletal:     Comments: Grossly intact  Skin:    General: Skin is warm.     Capillary Refill: Capillary refill takes less than 2 seconds.  Neurological:     Mental Status: She is alert.     Comments: Grossly intact  Psychiatric:        Mood and Affect: Mood normal.        Behavior: Behavior normal.        Assessment & Plan:   1. Wellness examination Adult wellness-complete.wellness physical was conducted today. Importance of diet and exercise were discussed in detail.  Importance of stress reduction and healthy living were discussed.  In addition to this a discussion regarding safety was also covered.  We also reviewed over immunizations and gave recommendations regarding current immunization  needed for age.   In addition to this additional areas were also touched on including: Preventative health exams needed:  Colonoscopy current due 07/2022 HIV screening completed 09/08/2020 Mammogram current due 06/2022 Eye exam current due 05/2022 Dental exam scheduled for next week.  Patient was advised yearly wellness exam  2. Chronic midline low back pain without sciatica - Back pain stable on baclofen - baclofen (LIORESAL) 10 MG tablet; Take 1 tablet (10 mg total) by mouth 3 (three) times daily.  Dispense: 90 tablet; Refill: 2  3. Chronic right shoulder pain - Shoulder pain current on gabapentin and baclofen - gabapentin (NEURONTIN) 800 MG tablet; Take 1 tablet (800 mg total) by mouth 3 (three) times daily.  Dispense: 90 tablet; Refill: 1  4. Hyperlipidemia, unspecified hyperlipidemia type - Last LDL 98. Goal of less than 100 met. - Continue taking Lipitor as prescribed.  - CMP14+EGFR - Lipid Profile  5. Ankle Pain - Patient completed PT for ankle pain.  - Through shared discussion, patient will continue to do exercises and invest in compression stockings.  6. Cardiac Murmur - Mild cardiac murmur noted on physical exam today.  - Murmur not new. ECHO done on 06/2020 noted mild regurgitation to MV and Aortic Valve.  - Continue to follow up with cardiology as schedule. - RTC if you notice increasing SOB or chest  pain.    Follow up in 6 months    Note:  This document was prepared using Dragon voice recognition software and may include unintentional dictation errors. Note - This record has been created using [employer].  Chart creation errors have been sought, but may not always  have been located. Such creation errors do not reflect on  the standard of medical care.

## 2021-09-22 ENCOUNTER — Other Ambulatory Visit: Payer: Self-pay | Admitting: Nurse Practitioner

## 2021-09-22 DIAGNOSIS — E876 Hypokalemia: Secondary | ICD-10-CM

## 2021-09-22 LAB — CMP14+EGFR
ALT: 6 IU/L (ref 0–32)
AST: 15 IU/L (ref 0–40)
Albumin/Globulin Ratio: 1.7 (ref 1.2–2.2)
Albumin: 4.4 g/dL (ref 3.8–4.8)
Alkaline Phosphatase: 68 IU/L (ref 44–121)
BUN/Creatinine Ratio: 10 (ref 9–23)
BUN: 10 mg/dL (ref 6–24)
Bilirubin Total: 0.4 mg/dL (ref 0.0–1.2)
CO2: 25 mmol/L (ref 20–29)
Calcium: 9.2 mg/dL (ref 8.7–10.2)
Chloride: 98 mmol/L (ref 96–106)
Creatinine, Ser: 0.96 mg/dL (ref 0.57–1.00)
Globulin, Total: 2.6 g/dL (ref 1.5–4.5)
Glucose: 97 mg/dL (ref 70–99)
Potassium: 3.2 mmol/L — ABNORMAL LOW (ref 3.5–5.2)
Sodium: 139 mmol/L (ref 134–144)
Total Protein: 7 g/dL (ref 6.0–8.5)
eGFR: 73 mL/min/{1.73_m2} (ref >59)

## 2021-09-22 LAB — LIPID PANEL
Chol/HDL Ratio: 4.1 ratio (ref 0.0–4.4)
Cholesterol, Total: 165 mg/dL (ref 100–199)
HDL: 40 mg/dL (ref >39)
LDL Chol Calc (NIH): 95 mg/dL (ref 0–99)
Triglycerides: 174 mg/dL — ABNORMAL HIGH (ref 0–149)
VLDL Cholesterol Cal: 30 mg/dL (ref 5–40)

## 2021-09-22 MED ORDER — POTASSIUM CHLORIDE CRYS ER 10 MEQ PO TBCR: 10.0000 meq | EXTENDED_RELEASE_TABLET | Freq: Two times a day (BID) | ORAL | 2 refills | Status: DC

## 2021-09-27 ENCOUNTER — Ambulatory Visit: Payer: Medicaid Other | Admitting: "Endocrinology

## 2021-09-28 ENCOUNTER — Ambulatory Visit (HOSPITAL_COMMUNITY): Admission: RE | Admit: 2021-09-28 | Payer: Medicaid Other | Source: Ambulatory Visit

## 2021-09-28 ENCOUNTER — Encounter (HOSPITAL_COMMUNITY): Payer: Self-pay

## 2021-10-13 ENCOUNTER — Ambulatory Visit: Payer: Medicaid Other | Admitting: "Endocrinology

## 2021-10-13 ENCOUNTER — Ambulatory Visit (HOSPITAL_COMMUNITY)
Admission: RE | Admit: 2021-10-13 | Discharge: 2021-10-13 | Disposition: A | Discharge status: Home / Self Care | Payer: Medicaid Other | Source: Ambulatory Visit | Attending: "Endocrinology | Admitting: "Endocrinology

## 2021-10-13 DIAGNOSIS — E042 Nontoxic multinodular goiter: Secondary | ICD-10-CM | POA: Diagnosis present

## 2021-10-21 ENCOUNTER — Ambulatory Visit: Payer: Medicaid Other | Admitting: "Endocrinology

## 2021-10-21 ENCOUNTER — Encounter: Payer: Self-pay | Admitting: "Endocrinology

## 2021-10-21 VITALS — BP 108/68 | HR 76 | Ht 63.0 in | Wt 153.0 lb

## 2021-10-21 DIAGNOSIS — E042 Nontoxic multinodular goiter: Secondary | ICD-10-CM

## 2021-10-21 NOTE — Progress Notes (Unsigned)
Destiny Mckusick, DO  Donita Brooks D OK for FNA right #3 nodule.  Destiny Manning

## 2021-10-21 NOTE — Progress Notes (Signed)
10/21/2021, 12:46 PM  Endocrinology follow-up note   Subjective:    Patient ID: Destiny Manning, female    DOB: 10/08/1970, PCP [name], [name], NP   Past Medical History:  Diagnosis Date   Asthma dx 06/04/07   ventolin, singulair   Blood transfusion without reported diagnosis    CHF (congestive heart failure) ([place]) 03/19/2008   a. 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 normalized by repeat imaging and at 55-60% in 04/2019,   DCM (dilated cardiomyopathy) ([place]) dx 06/06/07   EF 10-15%   Degeneration of spine    DJD (degenerative joint disease), lumbar 11/16/2016   chronic lumbar pain   GERD (gastroesophageal reflux disease)    takes Nexium Daily   Goiter 09/09/2020   Hyperlipidemia, unspecified 09/21/2021   Hypertension    Sciatica 09/21/2021   Seasonal allergies    takes zyrtec   Past Surgical History:  Procedure Laterality Date   ABDOMINAL HYSTERECTOMY     fibroids; partial- ovaries intact   BIOPSY  02/17/2021   Procedure: BIOPSY;  Surgeon: [name], MD;  Location: [facility];  Service: Endoscopy;;   CERVICAL SPINE SURGERY  2002   [facility], ruptured disc   CHOLECYSTECTOMY  2001   laparoscopic, [facility]   COLONOSCOPY WITH PROPOFOL N/A 07/12/2017   tubular adenoma (10 mm polyp in cecum), segmental biopsies negative. 5 year surveillance   ESOPHAGOGASTRODUODENOSCOPY (EGD) WITH ESOPHAGEAL DILATION N/A 09/05/2012   [id number] hiatal hernia s/p esophageal dilation with [address].    ESOPHAGOGASTRODUODENOSCOPY (EGD) WITH PROPOFOL N/A 07/12/2017   normal esophagus s/p dilation, normal duodenal bulb and second portion of duodenum   ESOPHAGOGASTRODUODENOSCOPY (EGD) WITH PROPOFOL N/A 02/17/2021   Surgeon: [name], MD;   Normal esophagus s/p dilation, normal stomach and examined duodenum.   LAPAROSCOPIC BILATERAL SALPINGO OOPHERECTOMY Bilateral 12/15/2020   Procedure: LAPAROSCOPIC BILATERAL  SALPINGO OOPHORECTOMY;  Surgeon: [name], MD;  Location: [facility];  Service: Gynecology;  Laterality: Bilateral;   MALONEY DILATION N/A 07/12/2017   Procedure: [name] DILATION;  Surgeon: [name], MD;  Location: [facility];  Service: Endoscopy;  Laterality: N/A;   MALONEY DILATION N/A 02/17/2021   Procedure: [name] DILATION;  Surgeon: [name], MD;  Location: [facility];  Service: Endoscopy;  Laterality: N/A;   POLYPECTOMY  07/12/2017   Procedure: POLYPECTOMY;  Surgeon: [name], MD;  Location: [facility];  Service: Endoscopy;;  Cecal polyp (HS)   SPINE SURGERY     TUBAL LIGATION     VAGINAL HYSTERECTOMY  10/19/2010   Procedure: HYSTERECTOMY VAGINAL;  Surgeon: [name], MD;  Location: [facility];  Service: Gynecology;  Laterality: N/A;   Social History   Socioeconomic History   Marital status: Single    Spouse name: Not on file   Number of children: 2   Years of education: 16   Highest education level: Not on file  Occupational History   Occupation: [employer]    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  Tobacco Use   Smoking status: Former    Packs/day: 0.25    Years: 4.00    Total pack years: 1.00    Types: Cigarettes  Quit date: 03/14/2009    Years since quitting: 12.6   Smokeless tobacco: Never  Vaping Use   Vaping Use: Never used  Substance and Sexual Activity   Alcohol use: No   Drug use: No   Sexual activity: Not Currently    Birth control/protection: Surgical    Comment: hyst  Other Topics Concern   Not on file  Social History Narrative   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[employer]; 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      Lives with 2 children- age 20 and 16; she homeschools them   Social Determinants of Health   Financial Resource Strain: High Risk (11/02/2020)   Overall Financial Resource Strain (CARDIA)    Difficulty of Paying Living Expenses: Hard  Food Insecurity: No Food Insecurity (11/02/2020)   Hunger Vital Sign    Worried About  Running Out of Food in the Last Year: Never true    Ran Out of Food in the Last Year: Never true  Transportation Needs: No Transportation Needs (11/02/2020)   PRAPARE - Hydrologist (Medical): No    Lack of Transportation (Non-Medical): No  Physical Activity: Insufficiently Active (11/02/2020)   Exercise Vital Sign    Days of Exercise per Week: 1 day    Minutes of Exercise per Session: 20 min  Stress: No Stress Concern Present (11/02/2020)   [place]    Feeling of Stress : Not at all  Social Connections: Socially Isolated (11/02/2020)   Social Connection and Isolation Panel [NHANES]    Frequency of Communication with Friends and Family: Once a week    Frequency of Social Gatherings with Friends and Family: Never    Attends Religious Services: More than 4 times per year    Active Member of [employer] or Organizations: No    Attends Music therapist: Never    Marital Status: Never married   Family History  Problem Relation Age of Onset   Diabetes Mother    Hypertension Mother    Congestive Heart Failure Father 25   Arthritis Father    Dementia Maternal Grandmother    Congestive Heart Failure Paternal Grandmother    Congestive Heart Failure Paternal Grandfather    Cancer Maternal Uncle    Anesthesia problems Neg Hx    Hypotension Neg Hx    Pseudochol deficiency Neg Hx    Malignant hyperthermia Neg Hx    Colon cancer Neg Hx    Colon polyps Neg Hx    Outpatient Encounter Medications as of 10/21/2021  Medication Sig   albuterol (VENTOLIN HFA) 108 (90 Base) MCG/ACT inhaler Inhale 2 puffs into the lungs every 6 (six) hours as needed for wheezing or shortness of breath.   atorvastatin (LIPITOR) 10 MG tablet TAKE ONE TABLET BY MOUTH ONCE DAILY   baclofen (LIORESAL) 10 MG tablet Take 1 tablet (10 mg total) by mouth 3 (three) times daily.   carvedilol (COREG) 25 MG tablet TAKE ONE TABLET  BY MOUTH 2 TIMES A DAY   cetirizine (ZYRTEC) 10 MG tablet Take 1 tablet (10 mg total) by mouth daily.   chlorthalidone (HYGROTON) 25 MG tablet Take 0.5 tablets (12.5 mg total) by mouth daily. NEEDS APPOINTMENT WITH CARDIOLOGIST FOR FURTHER REFILLS   estradiol (ESTRACE) 2 MG tablet Take 1 tablet (2 mg total) by mouth daily.   fluticasone (FLONASE) 50 MCG/ACT nasal spray Place 1 spray into both nostrils daily.   Fluticasone-Salmeterol (ADVAIR) 500-50 MCG/DOSE AEPB Inhale 1 puff  into the lungs 2 (two) times daily as needed (respiratory issues.).   gabapentin (NEURONTIN) 800 MG tablet Take 1 tablet (800 mg total) by mouth 3 (three) times daily.   losartan (COZAAR) 100 MG tablet TAKE ONE TABLET BY MOUTH ONCE DAILY   lubiprostone (AMITIZA) 8 MCG capsule TAKE (1) CAPSULE BY MOUTH TWICE DAILY.   montelukast (SINGULAIR) 10 MG tablet Take 1 tablet (10 mg total) by mouth at bedtime.   ondansetron (ZOFRAN) 4 MG tablet Take 1 tablet (4 mg total) by mouth every 8 (eight) hours as needed for nausea or vomiting.   pantoprazole (PROTONIX) 40 MG tablet Take 1 tablet (40 mg total) by mouth daily before breakfast.   potassium chloride (KLOR-CON M) 10 MEQ tablet Take 1 tablet (10 mEq total) by mouth 2 (two) times daily.   SUMAtriptan (IMITREX) 50 MG tablet Take 1 tablet (50 mg total) by mouth 2 (two) times daily as needed for migraine. May repeat in 2 hours if headache persists or recurs.   No facility-administered encounter medications on file as of 10/21/2021.   ALLERGIES: No Known Allergies  VACCINATION STATUS: Immunization History  Administered Date(s) Administered   Influenza,inj,Quad PF,6+ Mos 02/19/2017   Moderna Sars-Covid-2 Vaccination 06/25/2019, 07/30/2019, 05/11/2020, 07/29/2020    HPI Destiny Manning is 51 y.o. female who presents today with a medical history as above. she is being seen in follow-up after she was seen in consultation for multinodular goiter requested by [name], [name], NP.  She  was advised to return with repeat thyroid ultrasound after she was seen for multinodular goiter during her last visit.  Her repeat ultrasound shows persistence of multiple nodules in her thyroid bilateral lobes.  Largest nodules on the right lobe included 2.2 cm nodule as well as 1.5 cm suspicious nodule on the inferior part of the right lobe.  1.0 cm nodule in the isthmus.   See notes from previous visit.  She still denies dysphagia, shortness of breath, nor voice change.   Her last thyroid function test was consistent with euthyroid presentation.  She is not on antithyroid medications, nor thyroid hormone supplements.  She is not a smoker.  Her medical history includes well-controlled hypertension, hyperlipidemia, CHF.  Review of Systems  Constitutional: no recent weight gain/loss, no fatigue, no subjective hyperthermia, no subjective hypothermia Eyes: no blurry vision, no xerophthalmia ENT: no sore throat, no nodules palpated in throat, no dysphagia/odynophagia, no hoarseness Cardiovascular: no Chest Pain, no Shortness of Breath, no palpitations, no leg swelling Respiratory: no cough, no shortness of breath Gastrointestinal: no Nausea/Vomiting/Diarhhea Musculoskeletal: no muscle/joint aches Skin: no rashes Neurological: no tremors, no numbness, no tingling, no dizziness Psychiatric: no depression, no anxiety  Objective:       10/21/2021    9:59 AM 09/21/2021    9:03 AM 08/29/2021   11:20 AM  Vitals with BMI  Height '5\' 3"'$  '5\' 3"'$  '5\' 3"'$   Weight 153 lbs 154 lbs 156 lbs  BMI 27.11 82.42 35.36  Systolic 144 315   Diastolic 68 61   Pulse 76 82     BP 108/68   Pulse 76   Ht '5\' 3"'$  (1.6 m)   Wt 153 lb (69.4 kg)   LMP 07/04/2010   BMI 27.10 kg/m   Wt Readings from Last 3 Encounters:  10/21/21 153 lb (69.4 kg)  09/21/21 154 lb (69.9 kg)  08/29/21 156 lb (70.8 kg)    Physical Exam  Constitutional:  Body mass index is 27.1 kg/m.,  not in  acute distress, normal state of mind Eyes:  PERRLA, EOMI, no exophthalmos ENT: moist mucous membranes, + palpable, normal  size thyroid , no gross cervical lymphadenopathy Cardiovascular: normal precordial activity, Regular Rate and Rhythm, no Murmur/Rubs/Gallops Respiratory:  adequate breathing efforts, no gross chest deformity, Clear to auscultation bilaterally Gastrointestinal: abdomen soft, Non -tender, No distension, Bowel Sounds present, no gross organomegaly Musculoskeletal: no gross deformities, strength intact in all four extremities Skin: moist, warm, no rashes Neurological: no tremor with outstretched hands, Deep tendon reflexes normal in bilateral lower extremities.  CMP ( most recent) CMP     Component Value Date/Time   NA 139 09/21/2021 1002   K 3.2 (L) 09/21/2021 1002   CL 98 09/21/2021 1002   CO2 25 09/21/2021 1002   GLUCOSE 97 09/21/2021 1002   GLUCOSE 88 02/14/2021 1312   BUN 10 09/21/2021 1002   CREATININE 0.96 09/21/2021 1002   CALCIUM 9.2 09/21/2021 1002   PROT 7.0 09/21/2021 1002   ALBUMIN 4.4 09/21/2021 1002   AST 15 09/21/2021 1002   ALT 6 09/21/2021 1002   ALKPHOS 68 09/21/2021 1002   BILITOT 0.4 09/21/2021 1002   GFRNONAA >60 02/14/2021 1312   GFRAA >60 07/05/2017 0912       Lipid Panel ( most recent) Lipid Panel     Component Value Date/Time   CHOL 165 09/21/2021 1002   TRIG 174 (H) 09/21/2021 1002   HDL 40 09/21/2021 1002   CHOLHDL 4.1 09/21/2021 1002   LDLCALC 95 09/21/2021 1002   LABVLDL 30 09/21/2021 1002      Lab Results  Component Value Date   TSH 0.504 09/07/2020   FREET4 1.41 09/07/2020     Thyroid ultrasound on October 13, 2021 IMPRESSION: 1. Large multinodular thyroid gland most consistent with multinodular goiter. The majority of the nodules do not meet criteria to warrant biopsy or imaging surveillance. However, there are 3 that do meet criteria and they are listed below. 2. Nodule #3 (1.5 cm TI-RADS category 4) in the right medial inferior gland meets criteria to  consider fine-needle aspiration biopsy. 3. Nodule #1 and #4 meet criteria for imaging surveillance. Recommend follow-up ultrasound in 1 year.     Assessment & Plan:   1. Multinodular goiter I reviewed her recent thyroid ultrasound findings with her.  According to the ultrasound report, she will need fine-needle aspiration of one of her nodules on the right lobe.  For the rest of her thyroid nodules, she will may need follow-up with surveillance thyroid ultrasound imaging studies.  She will be sent for this procedure to [facility].  She will return with her biopsy results in 3 weeks.  Further steps will depend on findings of the biopsy.  She will also need a repeat thyroid function test. -She is advised to continue her other medications for hypertension, hyperlipidemia, and CHF. - she is advised to maintain close follow up with [name], [name], NP for primary care needs.    I spent 21 minutes in the care of the patient today including review of labs from Thyroid Function, CMP, and other relevant labs ; imaging/biopsy records (current and previous including abstractions from other facilities); face-to-face time discussing  her lab results and symptoms, medications doses, her options of short and long term treatment based on the latest standards of care / guidelines;   and documenting the encounter.  Destiny Manning  participated in the discussions, expressed understanding, and voiced agreement with the above plans.  All questions were  answered to her satisfaction. she is encouraged to contact clinic should she have any questions or concerns prior to her return visit.  Follow up plan: Return in about 3 weeks (around 11/11/2021) for F/U with Biopsy Results, F/U with Pre-visit Labs.   Glade Lloyd, MD [facility] [address] [place], [place] [address] Phone: [phone]  Fax: [phone]     10/21/2021, 12:46 PM  This note  was partially dictated with voice recognition software. Similar sounding words can be transcribed inadequately or may not  be corrected upon review.

## 2021-10-26 ENCOUNTER — Ambulatory Visit: Payer: Medicaid Other | Admitting: Nurse Practitioner

## 2021-10-26 ENCOUNTER — Ambulatory Visit (HOSPITAL_COMMUNITY)
Admission: RE | Admit: 2021-10-26 | Discharge: 2021-10-26 | Disposition: A | Discharge status: Home / Self Care | Payer: Medicaid Other | Source: Ambulatory Visit | Attending: "Endocrinology | Admitting: "Endocrinology

## 2021-10-26 DIAGNOSIS — E042 Nontoxic multinodular goiter: Secondary | ICD-10-CM | POA: Insufficient documentation

## 2021-10-27 ENCOUNTER — Ambulatory Visit (HOSPITAL_COMMUNITY): Admission: RE | Admit: 2021-10-27 | Payer: Medicaid Other | Source: Ambulatory Visit

## 2021-10-27 LAB — CYTOLOGY - NON PAP

## 2021-10-28 ENCOUNTER — Other Ambulatory Visit: Payer: Self-pay | Admitting: Student

## 2021-10-28 ENCOUNTER — Other Ambulatory Visit: Payer: Self-pay

## 2021-10-28 MED ORDER — CHLORTHALIDONE 25 MG PO TABS: 12.5000 mg | ORAL_TABLET | Freq: Every day | ORAL | 3 refills | Status: DC

## 2021-11-04 LAB — THYROGLOBULIN ANTIBODY: Thyroglobulin Antibody: <1 IU/mL (ref 0.0–0.9)

## 2021-11-04 LAB — THYROID PEROXIDASE ANTIBODY: Thyroperoxidase Ab SerPl-aCnc: 10 IU/mL (ref 0–34)

## 2021-11-04 LAB — TSH: TSH: 0.48 u[IU]/mL (ref 0.450–4.500)

## 2021-11-04 LAB — T3, FREE: T3, Free: 2.6 pg/mL (ref 2.0–4.4)

## 2021-11-04 LAB — T4, FREE: Free T4: 1.63 ng/dL (ref 0.82–1.77)

## 2021-11-08 ENCOUNTER — Encounter: Payer: Self-pay | Admitting: Nurse Practitioner

## 2021-11-08 ENCOUNTER — Ambulatory Visit: Payer: Medicaid Other | Admitting: Nurse Practitioner

## 2021-11-08 VITALS — BP 130/60 | HR 71 | Temp 97.9°F | Ht 63.0 in | Wt 149.4 lb

## 2021-11-08 DIAGNOSIS — H6983 Other specified disorders of Eustachian tube, bilateral: Secondary | ICD-10-CM

## 2021-11-08 DIAGNOSIS — R21 Rash and other nonspecific skin eruption: Secondary | ICD-10-CM

## 2021-11-08 DIAGNOSIS — H04203 Unspecified epiphora, bilateral lacrimal glands: Secondary | ICD-10-CM | POA: Diagnosis not present

## 2021-11-08 MED ORDER — PREDNISONE 20 MG PO TABS: 20.0000 mg | ORAL_TABLET | Freq: Two times a day (BID) | ORAL | 0 refills | Status: DC

## 2021-11-08 MED ORDER — TRIAMCINOLONE ACETONIDE 0.1 % EX CREA: 1.0000 | TOPICAL_CREAM | Freq: Two times a day (BID) | CUTANEOUS | 0 refills | Status: DC

## 2021-11-08 NOTE — Progress Notes (Signed)
Subjective:    Patient ID: Destiny Manning, female    DOB: 1970/10/25, 51 y.o.   MRN: [id number]  HPI  Eczema rash x 1 week.  Patient states that she has been using some of her daughter's triamcinolone cream and its been helping for her eczema-like rash.  Patient also has concerns about her ears feeling stopped up in her sound sounding muffled.  Patient states that she has been dealing with this sensation for several years.  Patient currently takes allergy medications for but is not been helpful.   Patient also has concerns about her eyes draining and itching.  Patient states that her eyes started draining a few days ago.  Patient states that she will notice crusting in her eyes.  Patient denies any changes to her vision.    Review of Systems  HENT:  Positive for ear pain.   Eyes:  Positive for discharge and itching.  Skin:  Positive for rash.       Objective:   Physical Exam Vitals reviewed.  Constitutional:      General: She is not in acute distress.    Appearance: Normal appearance. She is normal weight. She is not ill-appearing, toxic-appearing or diaphoretic.  HENT:     Head: Normocephalic and atraumatic.     Right Ear: Tympanic membrane, ear canal and external ear normal. There is no impacted cerumen.     Left Ear: Tympanic membrane, ear canal and external ear normal. There is no impacted cerumen.     Ears:     Comments: Bilateral tympanic membranes within normal limits.  No effusion noted no erythema, no bulging noted Eyes:     General:        Right eye: Discharge present.        Left eye: Discharge present.    Extraocular Movements: Extraocular movements intact.     Conjunctiva/sclera: Conjunctivae normal.     Pupils: Pupils are equal, round, and reactive to light.     Comments: Left and right eyes draining clear tears.  No erythema, no swelling, no iritis, no conjunctivitis noted  Cardiovascular:     Rate and Rhythm: Normal rate and regular rhythm.     Pulses:  Normal pulses.     Heart sounds: Normal heart sounds. No murmur heard. Pulmonary:     Effort: Pulmonary effort is normal. No respiratory distress.     Breath sounds: Normal breath sounds. No wheezing.  Musculoskeletal:     Comments: Grossly intact  Skin:    General: Skin is warm.     Capillary Refill: Capillary refill takes less than 2 seconds.     Comments: Healing excoriation noted to patient's back.  No redness no swelling.  Neurological:     Mental Status: She is alert.     Comments: Grossly intact  Psychiatric:        Mood and Affect: Mood normal.        Behavior: Behavior normal.           Assessment & Plan:   1. Bilateral epiphora -Etiology unclear at this time. -Infectious etiology less likely at this time due to lack of findings on physical exam -Patient to follow-up with ophthalmology for further evaluation -May use over-the-counter moisturizing eyedrops -Return to clinic if symptoms do not improve or if they worsen  2. Dysfunction of both eustachian tubes -Ear symptoms likely stashing tube dysfunction -We will trial patient on prednisone to see if that helps - Ambulatory referral to ENT -  predniSONE (DELTASONE) 20 MG tablet; Take 1 tablet (20 mg total) by mouth 2 (two) times daily with a meal.  Dispense: 14 tablet; Refill: 0 -Return to clinic if symptoms do not improve or if they worsen  3. Rash -Likely dermatitis such as eczema - triamcinolone cream (KENALOG) 0.1 %; Apply 1 Application topically 2 (two) times daily.  Dispense: 30 g; Refill: 0 -Return to clinic if symptoms do not improve or if they worsen    Note:  This document was prepared using Dragon voice recognition software and may include unintentional dictation errors. Note - This record has been created using [employer].  Chart creation errors have been sought, but may not always  have been located. Such creation errors do not reflect on  the standard of medical care.

## 2021-11-08 NOTE — Progress Notes (Signed)
Eczema rash x 1 week, ears stopped up sounds muffled, eyes draining itching.

## 2021-11-09 ENCOUNTER — Encounter: Payer: Self-pay | Admitting: "Endocrinology

## 2021-11-09 ENCOUNTER — Ambulatory Visit: Payer: Medicaid Other | Admitting: "Endocrinology

## 2021-11-09 VITALS — BP 120/58 | HR 80 | Ht 63.0 in | Wt 148.8 lb

## 2021-11-09 DIAGNOSIS — E042 Nontoxic multinodular goiter: Secondary | ICD-10-CM

## 2021-11-09 NOTE — Progress Notes (Signed)
11/09/2021, 10:34 AM  Endocrinology follow-up note   Subjective:    Patient ID: Destiny Manning, female    DOB: July 11, 1970, PCP [name], [name], NP   Past Medical History:  Diagnosis Date   Asthma dx 06/04/07   ventolin, singulair   Blood transfusion without reported diagnosis    CHF (congestive heart failure) ([place]) 03/19/2008   a. 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 normalized by repeat imaging and at 55-60% in 04/2019,   DCM (dilated cardiomyopathy) ([place]) dx 06/06/07   EF 10-15%   Degeneration of spine    DJD (degenerative joint disease), lumbar 11/16/2016   chronic lumbar pain   GERD (gastroesophageal reflux disease)    takes Nexium Daily   Goiter 09/09/2020   Hyperlipidemia, unspecified 09/21/2021   Hypertension    Sciatica 09/21/2021   Seasonal allergies    takes zyrtec   Past Surgical History:  Procedure Laterality Date   ABDOMINAL HYSTERECTOMY     fibroids; partial- ovaries intact   BIOPSY  02/17/2021   Procedure: BIOPSY;  Surgeon: [name], MD;  Location: [facility];  Service: Endoscopy;;   CERVICAL SPINE SURGERY  2002   [facility], ruptured disc   CHOLECYSTECTOMY  2001   laparoscopic, [facility]   COLONOSCOPY WITH PROPOFOL N/A 07/12/2017   tubular adenoma (10 mm polyp in cecum), segmental biopsies negative. 5 year surveillance   ESOPHAGOGASTRODUODENOSCOPY (EGD) WITH ESOPHAGEAL DILATION N/A 09/05/2012   XFG:HWEXH hiatal hernia s/p esophageal dilation with 37 F Maloney.    ESOPHAGOGASTRODUODENOSCOPY (EGD) WITH PROPOFOL N/A 07/12/2017   normal esophagus s/p dilation, normal duodenal bulb and second portion of duodenum   ESOPHAGOGASTRODUODENOSCOPY (EGD) WITH PROPOFOL N/A 02/17/2021   Surgeon: [name], MD;   Normal esophagus s/p dilation, normal stomach and examined duodenum.   LAPAROSCOPIC BILATERAL SALPINGO OOPHERECTOMY Bilateral 12/15/2020   Procedure: LAPAROSCOPIC BILATERAL SALPINGO  OOPHORECTOMY;  Surgeon: [name], MD;  Location: [facility];  Service: Gynecology;  Laterality: Bilateral;   MALONEY DILATION N/A 07/12/2017   Procedure: [name] DILATION;  Surgeon: [name], MD;  Location: [facility];  Service: Endoscopy;  Laterality: N/A;   MALONEY DILATION N/A 02/17/2021   Procedure: [name] DILATION;  Surgeon: [name], MD;  [facility];  Service: Endoscopy;  Laterality: N/A;   POLYPECTOMY  07/12/2017   Procedure: POLYPECTOMY;  Surgeon: [name], MD;  [facility];  Service: Endoscopy;;  Cecal polyp (HS)   SPINE SURGERY     TUBAL LIGATION     VAGINAL HYSTERECTOMY  10/19/2010   Procedure: HYSTERECTOMY VAGINAL;  Surgeon: [name], MD;  Location: [facility];  Service: Gynecology;  Laterality: N/A;   Social History   Socioeconomic History   Marital status: Single    Spouse name: Not on file   Number of children: 2   Years of education: 16   Highest education level: Not on file  Occupational History   Occupation: [employer]    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  Tobacco Use   Smoking status: Former    Packs/day: 0.25    Years: 4.00    Total pack years: 1.00    Types: Cigarettes  Quit date: 03/14/2009    Years since quitting: 12.6   Smokeless tobacco: Never  Vaping Use   Vaping Use: Never used  Substance and Sexual Activity   Alcohol use: No   Drug use: No   Sexual activity: Not Currently    Birth control/protection: Surgical    Comment: hyst  Other Topics Concern   Not on file  Social History Narrative   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[employer]; 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      Lives with 2 children- age 34 and 65; she homeschools them   Social Determinants of Health   Financial Resource Strain: High Risk (11/02/2020)   Overall Financial Resource Strain (CARDIA)    Difficulty of Paying Living Expenses: Hard  Food Insecurity: No Food Insecurity (11/02/2020)   Hunger Vital Sign    Worried About Running  Out of Food in the Last Year: Never true    Ran Out of Food in the Last Year: Never true  Transportation Needs: No Transportation Needs (11/02/2020)   PRAPARE - Hydrologist (Medical): No    Lack of Transportation (Non-Medical): No  Physical Activity: Insufficiently Active (11/02/2020)   Exercise Vital Sign    Days of Exercise per Week: 1 day    Minutes of Exercise per Session: 20 min  Stress: No Stress Concern Present (11/02/2020)   [place]    Feeling of Stress : Not at all  Social Connections: Socially Isolated (11/02/2020)   Social Connection and Isolation Panel [NHANES]    Frequency of Communication with Friends and Family: Once a week    Frequency of Social Gatherings with Friends and Family: Never    Attends Religious Services: More than 4 times per year    Active Member of [employer] or Organizations: No    Attends Music therapist: Never    Marital Status: Never married   Family History  Problem Relation Age of Onset   Diabetes Mother    Hypertension Mother    Congestive Heart Failure Father 66   Arthritis Father    Dementia Maternal Grandmother    Congestive Heart Failure Paternal Grandmother    Congestive Heart Failure Paternal Grandfather    Cancer Maternal Uncle    Anesthesia problems Neg Hx    Hypotension Neg Hx    Pseudochol deficiency Neg Hx    Malignant hyperthermia Neg Hx    Colon cancer Neg Hx    Colon polyps Neg Hx    Outpatient Encounter Medications as of 11/09/2021  Medication Sig   albuterol (VENTOLIN HFA) 108 (90 Base) MCG/ACT inhaler Inhale 2 puffs into the lungs every 6 (six) hours as needed for wheezing or shortness of breath.   atorvastatin (LIPITOR) 10 MG tablet TAKE ONE TABLET BY MOUTH ONCE DAILY   baclofen (LIORESAL) 10 MG tablet Take 1 tablet (10 mg total) by mouth 3 (three) times daily.   carvedilol (COREG) 25 MG tablet TAKE ONE TABLET BY MOUTH  2 TIMES A DAY   cetirizine (ZYRTEC) 10 MG tablet Take 1 tablet (10 mg total) by mouth daily.   chlorthalidone (HYGROTON) 25 MG tablet Take 0.5 tablets (12.5 mg total) by mouth daily.   estradiol (ESTRACE) 2 MG tablet Take 1 tablet (2 mg total) by mouth daily.   fluticasone (FLONASE) 50 MCG/ACT nasal spray Place 1 spray into both nostrils daily.   Fluticasone-Salmeterol (ADVAIR) 500-50 MCG/DOSE AEPB Inhale 1 puff into the lungs 2 (two) times daily  as needed (respiratory issues.).   gabapentin (NEURONTIN) 800 MG tablet Take 1 tablet (800 mg total) by mouth 3 (three) times daily.   losartan (COZAAR) 100 MG tablet TAKE ONE TABLET BY MOUTH ONCE DAILY   lubiprostone (AMITIZA) 8 MCG capsule TAKE (1) CAPSULE BY MOUTH TWICE DAILY.   montelukast (SINGULAIR) 10 MG tablet Take 1 tablet (10 mg total) by mouth at bedtime.   ondansetron (ZOFRAN) 4 MG tablet Take 1 tablet (4 mg total) by mouth every 8 (eight) hours as needed for nausea or vomiting.   pantoprazole (PROTONIX) 40 MG tablet Take 1 tablet (40 mg total) by mouth daily before breakfast.   potassium chloride (KLOR-CON M) 10 MEQ tablet Take 1 tablet (10 mEq total) by mouth 2 (two) times daily.   predniSONE (DELTASONE) 20 MG tablet Take 1 tablet (20 mg total) by mouth 2 (two) times daily with a meal.   SUMAtriptan (IMITREX) 50 MG tablet Take 1 tablet (50 mg total) by mouth 2 (two) times daily as needed for migraine. May repeat in 2 hours if headache persists or recurs.   triamcinolone cream (KENALOG) 0.1 % Apply 1 Application topically 2 (two) times daily.   No facility-administered encounter medications on file as of 11/09/2021.   ALLERGIES: No Known Allergies  VACCINATION STATUS: Immunization History  Administered Date(s) Administered   Influenza,inj,Quad PF,6+ Mos 02/19/2017   Moderna Sars-Covid-2 Vaccination 06/25/2019, 07/30/2019, 05/11/2020, 07/29/2020    HPI WILHELMENIA ADDIS is 51 y.o. female who presents today with a medical history as  above. she is being seen in follow-up after she was seen in consultation for multinodular goiter requested by [name], [name], NP.  She was sent for fine-needle aspiration biopsy of a thyroid nodule on the right lobe of her thyroid.  Her biopsy results are benign.  She has no new complaints today.  See notes from previous visit.    She still denies dysphagia, shortness of breath, nor voice change.   Her last thyroid function test was consistent with euthyroid presentation.  She is not on antithyroid medications, nor thyroid hormone supplements.  She is not a smoker.  Her medical history includes well-controlled hypertension, hyperlipidemia, CHF.  Review of Systems  Constitutional: no recent weight gain/loss, no fatigue, no subjective hyperthermia, no subjective hypothermia Eyes: no blurry vision, no xerophthalmia ENT: no sore throat, no nodules palpated in throat, no dysphagia/odynophagia, no hoarseness Cardiovascular: no Chest Pain, no Shortness of Breath, no palpitations, no leg swelling Respiratory: no cough, no shortness of breath Gastrointestinal: no Nausea/Vomiting/Diarhhea Musculoskeletal: no muscle/joint aches Skin: no rashes Neurological: no tremors, no numbness, no tingling, no dizziness Psychiatric: no depression, no anxiety  Objective:       11/09/2021    9:02 AM 11/08/2021    1:10 PM 10/21/2021    9:59 AM  Vitals with BMI  Height '5\' 3"'$  '5\' 3"'$  '5\' 3"'$   Weight 148 lbs 13 oz 149 lbs 6 oz 153 lbs  BMI 26.37 23.53 61.44  Systolic 315 400 867  Diastolic 58 60 68  Pulse 80 71 76    BP (!) 120/58   Pulse 80   Ht '5\' 3"'$  (1.6 m)   Wt 148 lb 12.8 oz (67.5 kg)   LMP 07/04/2010   BMI 26.36 kg/m   Wt Readings from Last 3 Encounters:  11/09/21 148 lb 12.8 oz (67.5 kg)  11/08/21 149 lb 6.4 oz (67.8 kg)  10/21/21 153 lb (69.4 kg)    Physical Exam  Constitutional:  Body mass  index is 26.36 kg/m.,  not in acute distress, normal state of mind Eyes: PERRLA, EOMI, no  exophthalmos ENT: moist mucous membranes, + palpable, normal  size thyroid , no gross cervical lymphadenopathy   CMP ( most recent) CMP     Component Value Date/Time   NA 139 09/21/2021 1002   K 3.2 (L) 09/21/2021 1002   CL 98 09/21/2021 1002   CO2 25 09/21/2021 1002   GLUCOSE 97 09/21/2021 1002   GLUCOSE 88 02/14/2021 1312   BUN 10 09/21/2021 1002   CREATININE 0.96 09/21/2021 1002   CALCIUM 9.2 09/21/2021 1002   PROT 7.0 09/21/2021 1002   ALBUMIN 4.4 09/21/2021 1002   AST 15 09/21/2021 1002   ALT 6 09/21/2021 1002   ALKPHOS 68 09/21/2021 1002   BILITOT 0.4 09/21/2021 1002   GFRNONAA >60 02/14/2021 1312   GFRAA >60 07/05/2017 0912       Lipid Panel ( most recent) Lipid Panel     Component Value Date/Time   CHOL 165 09/21/2021 1002   TRIG 174 (H) 09/21/2021 1002   HDL 40 09/21/2021 1002   CHOLHDL 4.1 09/21/2021 1002   LDLCALC 95 09/21/2021 1002   LABVLDL 30 09/21/2021 1002      Lab Results  Component Value Date   TSH 0.480 11/02/2021   TSH 0.504 09/07/2020   FREET4 1.63 11/02/2021   FREET4 1.41 09/07/2020     Thyroid ultrasound on October 13, 2021 IMPRESSION: 1. Large multinodular thyroid gland most consistent with multinodular goiter. The majority of the nodules do not meet criteria to warrant biopsy or imaging surveillance. However, there are 3 that do meet criteria and they are listed below. 2. Nodule #3 (1.5 cm TI-RADS category 4) in the right medial inferior gland meets criteria to consider fine-needle aspiration biopsy. 3. Nodule #1 and #4 meet criteria for imaging surveillance. Recommend follow-up ultrasound in 1 year.    October 26, 2021, fine-needle aspiration of right inferior lobe nodule. Clinical History: goiter  Specimen Submitted:  A. THYROID, RIGHT INFERIOR LOBE, FINE NEEDLE ASPIRATION:    FINAL MICROSCOPIC DIAGNOSIS:  - Consistent with benign follicular nodule (Bethesda category II)   SPECIMEN ADEQUACY:  Satisfactory for  evaluation   Assessment & Plan:   1. Multinodular goiter I reviewed her recent fine-needle aspiration biopsy results with her.  Her biopsy results are benign, she will not need thyroid surgery nor thyroid ablation at this time.  The nodule biopsied was on the right inferior lobe.  For the rest of her thyroid nodules, she will may need follow-up with surveillance thyroid ultrasound imaging studies. She will also need repeat thyroid function test and follow-up in a year.  -She is advised to continue her other medications for hypertension, hyperlipidemia, and CHF. - she is advised to maintain close follow up with [name], [name], NP for primary care needs.   I spent 21 minutes in the care of the patient today including review of labs from Thyroid Function, CMP, and other relevant labs ; imaging/biopsy records (current and previous including abstractions from other facilities); face-to-face time discussing  her lab results and symptoms, medications doses, her options of short and long term treatment based on the latest standards of care / guidelines;   and documenting the encounter.  Destiny Manning  participated in the discussions, expressed understanding, and voiced agreement with the above plans.  All questions were answered to her satisfaction. she is encouraged to contact clinic should she have any questions or concerns prior  to her return visit.   Follow up plan: Return in about 1 year (around 11/10/2022) for F/U with Pre-visit Labs, Thyroid / Neck Ultrasound.   Glade Lloyd, MD [facility] [address] [place], [place] [address] Phone: [phone]  Fax: [phone]     11/09/2021, 10:34 AM  This note was partially dictated with voice recognition software. Similar sounding words can be transcribed inadequately or may not  be corrected upon review.

## 2021-11-17 ENCOUNTER — Encounter: Payer: Medicaid Other | Admitting: Nurse Practitioner

## 2021-11-24 NOTE — Progress Notes (Signed)
Primary Care Physician:  [name], [name], NP  Primary GI: Dr. Gala Romney  Patient Location: Home   Provider Location: [facility] office   Reason for Visit: Follow-up   Persons present on the virtual encounter, with roles: Aliene Altes, PA-C (Provider), Destiny Manning (patient)   Total time (minutes) spent on medical discussion: 12 minutes  Virtual Visit via video Note Manning to COVID-19, visit is conducted virtually and was requested by patient.   I connected with Destiny Manning on 11/25/21 at  9:00 AM EDT by video and verified that I am speaking with the correct person using two identifiers.   I discussed the limitations, risks, security and privacy concerns of performing an evaluation and management service by video and the availability of in person appointments. I also discussed with the patient that there may be a patient responsible charge related to this service. The patient expressed understanding and agreed to proceed.  Chief Complaint  Patient presents with   Gastroesophageal Reflux    Pantoprazole not working.     History of Present Illness: Destiny Manning is a 51 year old female with history of esophageal dysphagia s/p empiric dilations, GERD, chronic constipation, adenomatous colon polyps Manning for surveillance colonoscopy in April 2024, presenting today for follow-up of GERD, dysphagia.  Last seen via video visit 08/29/2021.  GERD previously well controlled on Dexilant, but became uncontrolled 3 weeks prior with no known provoking factors.  Associated daily nausea and couple episodes of vomiting.  She reported no food or pill dysphagia, but intermittent pain/pressure when swallowing liquids.  Empiric dilation November 2022 did not change these symptoms.  Querried whether dysphagia may be secondary to motility disorder/esophageal spasm versus uncontrolled GERD.  Plan to focus on GERD management by changing Dexilant to Protonix 40 mg daily.  Requested progress report in 2 to 3 weeks.   She was continued on Amitiza 8 mcg twice daily as her constipation was well controlled with this.  No progress report received.  Today:  GERD:  Not adequately managed. Taking Pantoprazole 40 mg in the morning. Also tried taking omeprazole 20 mg at bedtime, but didn't notice much help. Symptoms daily. Can be during the day, but worse at night.  Intermittent nausea associated with reflux.  She did have 1 episode of vomiting after eating barbecue chicken, but this is not routine.  Similar symptoms with uncontrolled reflux previously.  In general, she is very careful with her diet.  Does not eat fried, fatty, greasy foods.  No spicy foods.  Limit junk food.  No carbonated beverages.  Does not lie down within 3 hours of eating.  Denies abdominal pain, BRBPR, melena.  NSAIDs: None.   Was on Prilosec and Nexium years ago.  Dexilant prior to trial of pantoprazole.  Dysphagia:  Continues with intermittent dysphagia without significant change.  Reports sometimes she feels liquids or foods will want to get hung in her mid chest, but when she takes her time and relaxes, symptoms resolve.  She prefers to monitor this for now.      Past Medical History:  Diagnosis Date   Asthma dx 06/04/07   ventolin, singulair   Blood transfusion without reported diagnosis    CHF (congestive heart failure) ([facility]) 03/19/2008   a. 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 normalized by repeat imaging and at 55-60% in 04/2019,   DCM (dilated cardiomyopathy) ([place]) dx 06/06/07   EF 10-15%   Degeneration of spine    DJD (degenerative joint disease),  lumbar 11/16/2016   chronic lumbar pain   GERD (gastroesophageal reflux disease)    takes Nexium Daily   Goiter 09/09/2020   Hyperlipidemia, unspecified 09/21/2021   Hypertension    Sciatica 09/21/2021   Seasonal allergies    takes zyrtec     Past Surgical History:  Procedure Laterality Date   ABDOMINAL HYSTERECTOMY     fibroids; partial- ovaries intact    BIOPSY  02/17/2021   Procedure: BIOPSY;  Surgeon: [name], MD;  Location: [facility];  Service: Endoscopy;;   CERVICAL SPINE SURGERY  2002   [facility], ruptured disc   CHOLECYSTECTOMY  2001   laparoscopic, [facility]   COLONOSCOPY WITH PROPOFOL N/A 07/12/2017   tubular adenoma (10 mm polyp in cecum), segmental biopsies negative. 5 year surveillance   ESOPHAGOGASTRODUODENOSCOPY (EGD) WITH ESOPHAGEAL DILATION N/A 09/05/2012   PIR:JJOAC hiatal hernia s/p esophageal dilation with [address].    ESOPHAGOGASTRODUODENOSCOPY (EGD) WITH PROPOFOL N/A 07/12/2017   normal esophagus s/p dilation, normal duodenal bulb and second portion of duodenum   ESOPHAGOGASTRODUODENOSCOPY (EGD) WITH PROPOFOL N/A 02/17/2021   Surgeon: [name], MD;   Normal esophagus s/p dilation, normal stomach and examined duodenum.   LAPAROSCOPIC BILATERAL SALPINGO OOPHERECTOMY Bilateral 12/15/2020   Procedure: LAPAROSCOPIC BILATERAL SALPINGO OOPHORECTOMY;  Surgeon: [name], MD;  Location: [facility] ORS;  Service: Gynecology;  Laterality: Bilateral;   MALONEY DILATION N/A 07/12/2017   Procedure: [name] DILATION;  Surgeon: [name], MD;  Location: [facility];  Service: Endoscopy;  Laterality: N/A;   MALONEY DILATION N/A 02/17/2021   Procedure: [name] DILATION;  Surgeon: [name], MD;  Location: [facility];  Service: Endoscopy;  Laterality: N/A;   POLYPECTOMY  07/12/2017   Procedure: POLYPECTOMY;  Surgeon: [name], MD;  Location: [facility];  Service: Endoscopy;;  Cecal polyp (HS)   SPINE SURGERY     TUBAL LIGATION     VAGINAL HYSTERECTOMY  10/19/2010   Procedure: HYSTERECTOMY VAGINAL;  Surgeon: [name], MD;  Location: [facility];  Service: Gynecology;  Laterality: N/A;     Current Meds  Medication Sig   atorvastatin (LIPITOR) 10 MG tablet TAKE ONE TABLET BY MOUTH ONCE DAILY   baclofen (LIORESAL) 10 MG tablet Take 1 tablet (10 mg total) by mouth 3 (three) times daily.    carvedilol (COREG) 25 MG tablet TAKE ONE TABLET BY MOUTH 2 TIMES A DAY   cetirizine (ZYRTEC) 10 MG tablet Take 1 tablet (10 mg total) by mouth daily.   estradiol (ESTRACE) 2 MG tablet Take 1 tablet (2 mg total) by mouth daily.   fluticasone (FLONASE) 50 MCG/ACT nasal spray Place 1 spray into both nostrils daily.   Fluticasone-Salmeterol (ADVAIR) 500-50 MCG/DOSE AEPB Inhale 1 puff into the lungs 2 (two) times daily as needed (respiratory issues.).   gabapentin (NEURONTIN) 800 MG tablet Take 1 tablet (800 mg total) by mouth 3 (three) times daily.   losartan (COZAAR) 100 MG tablet TAKE ONE TABLET BY MOUTH ONCE DAILY   lubiprostone (AMITIZA) 8 MCG capsule TAKE (1) CAPSULE BY MOUTH TWICE DAILY.   montelukast (SINGULAIR) 10 MG tablet Take 1 tablet (10 mg total) by mouth at bedtime.   ondansetron (ZOFRAN) 4 MG tablet Take 1 tablet (4 mg total) by mouth every 8 (eight) hours as needed for nausea or vomiting.   pantoprazole (PROTONIX) 40 MG tablet Take 1 tablet (40 mg total) by mouth 2 (two) times daily before a meal.   potassium chloride (  KLOR-CON M) 10 MEQ tablet Take 1 tablet (10 mEq total) by mouth 2 (two) times daily.   SUMAtriptan (IMITREX) 50 MG tablet Take 1 tablet (50 mg total) by mouth 2 (two) times daily as needed for migraine. May repeat in 2 hours if headache persists or recurs.   triamcinolone cream (KENALOG) 0.1 % Apply 1 Application topically 2 (two) times daily.   [DISCONTINUED] pantoprazole (PROTONIX) 40 MG tablet Take 1 tablet (40 mg total) by mouth daily before breakfast.     Family History  Problem Relation Age of Onset   Diabetes Mother    Hypertension Mother    Congestive Heart Failure Father 83   Arthritis Father    Dementia Maternal Grandmother    Congestive Heart Failure Paternal Grandmother    Congestive Heart Failure Paternal Grandfather    Cancer Maternal Uncle    Anesthesia problems Neg Hx    Hypotension Neg Hx    Pseudochol deficiency Neg Hx    Malignant  hyperthermia Neg Hx    Colon cancer Neg Hx    Colon polyps Neg Hx     Social History   Socioeconomic History   Marital status: Single    Spouse name: Not on file   Number of children: 2   Years of education: 16   Highest education level: Not on file  Occupational History   Occupation: [employer]    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  Tobacco Use   Smoking status: Former    Packs/day: 0.25    Years: 4.00    Total pack years: 1.00    Types: Cigarettes    Quit date: 03/14/2009    Years since quitting: 12.7   Smokeless tobacco: Never  Vaping Use   Vaping Use: Never used  Substance and Sexual Activity   Alcohol use: No   Drug use: No   Sexual activity: Not Currently    Birth control/protection: Surgical    Comment: hyst  Other Topics Concern   Not on file  Social History Narrative   Actuary at [employer]; 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      Lives with 2 children- age 67 and 38; she homeschools them   Social Determinants of Health   Financial Resource Strain: High Risk (11/02/2020)   Overall Financial Resource Strain (CARDIA)    Difficulty of Paying Living Expenses: Hard  Food Insecurity: No Food Insecurity (11/02/2020)   Hunger Vital Sign    Worried About Running Out of Food in the Last Year: Never true    [place] in the Last Year: Never true  Transportation Needs: No Transportation Needs (11/02/2020)   PRAPARE - Hydrologist (Medical): No    Lack of Transportation (Non-Medical): No  Physical Activity: Insufficiently Active (11/02/2020)   Exercise Vital Sign    Days of Exercise per Week: 1 day    Minutes of Exercise per Session: 20 min  Stress: No Stress Concern Present (11/02/2020)   [place]    Feeling of Stress : Not at all  Social Connections: Socially Isolated (11/02/2020)   Social Connection and Isolation Panel [NHANES]    Frequency of  Communication with Friends and Family: Once a week    Frequency of Social Gatherings with Friends and Family: Never    Attends Religious Services: More than 4 times per year    Active Member of [employer] or Organizations: No    Attends Archivist  Meetings: Never    Marital Status: Never married       Review of Systems: Gen: Denies fever, chills, cold or flulike symptoms, presyncope, syncope. CV: Denies chest pain, palpitations. Resp: Denies dyspnea, cough.  GI: see HPI Heme:  See HPI  Observations/Objective: No distress. Alert and oriented. Pleasant. Well nourished. Normal mood and affect. Unable to perform complete physical exam Manning to video encounter.   Assessment:  52 year old female with history of esophageal dysphagia s/p empiric dilations, GERD, chronic constipation, adenomatous colon polyps Manning for surveillance colonoscopy in April 2024, presenting today for follow-up of GERD, dysphagia.   GERD:  Chronic.  Not adequately managed on pantoprazole 40 mg daily.  Previously tried Prilosec and Nexium years ago, Dexilant prior to switching to pantoprazole in May 2023.  Associated intermittent nausea and rare episodes of vomiting that are related to significant reflux.  We will try increasing PPI to twice daily.  Dysphagia:  Continues with intermittent food and liquid dysphagia without significant change.  Some improvement when eating slower and relaxing.  She has undergone empiric dilations previously, but reports she did not have any significant improvement with her last dilation in November 2022.  Query whether symptoms are related to uncontrolled GERD versus underlying motility disorder.  Offered further evaluation, but patient prefers to monitor for now.   Plan: Increase pantoprazole to 40 mg twice daily 30 minutes before breakfast and dinner. Updated Rx sent to pharmacy.  Reinforced GERD diet/lifestyle.  Separate written instructions provided.  See AVS. Discussed  dysphagia precautions.  Separate written instructions provided.  See AVS. Requested 3-4 week progress report on reflux.  Consider adding famotidine at bedtime if daytime symptoms are well controlled with PPI twice daily.     I discussed the assessment and treatment plan with the patient. The patient was provided an opportunity to ask questions and all were answered. The patient agreed with the plan and demonstrated an understanding of the instructions.   The patient was advised to call back or seek an in-person evaluation if the symptoms worsen or if the condition fails to improve as anticipated.  I provided 12 minutes of video-face-to-face time during this encounter.  Aliene Altes, PA-C [facility]  11/25/2021

## 2021-11-25 ENCOUNTER — Other Ambulatory Visit: Payer: Self-pay | Admitting: Nurse Practitioner

## 2021-11-25 ENCOUNTER — Encounter: Payer: Self-pay | Admitting: Nurse Practitioner

## 2021-11-25 ENCOUNTER — Telehealth (INDEPENDENT_AMBULATORY_CARE_PROVIDER_SITE_OTHER): Payer: Medicaid Other | Admitting: Gastroenterology

## 2021-11-25 ENCOUNTER — Telehealth: Payer: Self-pay | Admitting: Gastroenterology

## 2021-11-25 ENCOUNTER — Encounter: Payer: Self-pay | Admitting: Gastroenterology

## 2021-11-25 DIAGNOSIS — G8929 Other chronic pain: Secondary | ICD-10-CM

## 2021-11-25 DIAGNOSIS — K219 Gastro-esophageal reflux disease without esophagitis: Secondary | ICD-10-CM | POA: Diagnosis not present

## 2021-11-25 MED ORDER — PANTOPRAZOLE SODIUM 40 MG PO TBEC: 40.0000 mg | DELAYED_RELEASE_TABLET | Freq: Two times a day (BID) | ORAL | 3 refills | Status: DC

## 2021-11-25 NOTE — Patient Instructions (Signed)
Increase pantoprazole to 40 mg twice daily 30 minutes before breakfast and dinner.  Continue to follow a GERD diet/lifestyle:  Avoid fried, fatty, greasy, spicy, citrus foods. Avoid caffeine and carbonated beverages. Avoid chocolate. Try eating 4-6 small meals a day rather than 3 large meals. Do not eat within 3 hours of laying down. Prop head of bed up on wood or bricks to create a 6 inch incline.  Swallowing precautions:  Eat slowly, take small bites, chew thoroughly, drink plenty of liquids throughout meals.  Avoid trough textures All meats should be chopped finely.  If something gets hung in your esophagus and will not come up or go down, proceed to the emergency room.     We will plan to follow-up with you in 3 months, but please call with a progress report in 3-4 weeks to let me know how your reflux is doing.  It was good to see you again today!  Aliene Altes, PA-C [facility]

## 2021-11-25 NOTE — Telephone Encounter (Signed)
Destiny Manning, patient needs 3 month follow-up. OK for VV. Please schedule with other APP as I will be out of the office.

## 2021-11-28 ENCOUNTER — Telehealth: Payer: Self-pay | Admitting: *Deleted

## 2021-11-28 NOTE — Telephone Encounter (Signed)
I sent in an updated Rx on 8/18. Please verify that another Rx is needed.

## 2021-11-28 NOTE — Telephone Encounter (Signed)
Spoke to pt, she informed me that she is supposed to be taking Pantoprazole '40mg'$  twice daily and the pharmacy has the old prescription, stating to take 1 daily. New prescription needs to be sent to [facility].

## 2021-11-30 ENCOUNTER — Ambulatory Visit: Payer: Medicaid Other | Admitting: Obstetrics & Gynecology

## 2021-11-30 NOTE — Telephone Encounter (Signed)
Spoke to pt, she did pick up correct prescription.

## 2021-12-27 ENCOUNTER — Other Ambulatory Visit: Payer: Self-pay | Admitting: Nurse Practitioner

## 2021-12-27 DIAGNOSIS — G8929 Other chronic pain: Secondary | ICD-10-CM

## 2021-12-28 ENCOUNTER — Encounter: Payer: Self-pay | Admitting: Nurse Practitioner

## 2021-12-30 ENCOUNTER — Ambulatory Visit: Payer: Medicaid Other | Admitting: Obstetrics & Gynecology

## 2022-01-03 ENCOUNTER — Other Ambulatory Visit: Payer: Self-pay | Admitting: Nurse Practitioner

## 2022-01-03 ENCOUNTER — Other Ambulatory Visit: Payer: Self-pay | Admitting: Obstetrics & Gynecology

## 2022-01-03 DIAGNOSIS — E876 Hypokalemia: Secondary | ICD-10-CM

## 2022-01-04 ENCOUNTER — Encounter: Payer: Self-pay | Admitting: Adult Health

## 2022-01-04 ENCOUNTER — Ambulatory Visit (INDEPENDENT_AMBULATORY_CARE_PROVIDER_SITE_OTHER): Payer: Medicaid Other | Admitting: Adult Health

## 2022-01-04 VITALS — BP 125/80 | HR 81 | Ht 63.5 in | Wt 147.0 lb

## 2022-01-04 DIAGNOSIS — Z79899 Other long term (current) drug therapy: Secondary | ICD-10-CM | POA: Diagnosis not present

## 2022-01-04 DIAGNOSIS — Z1211 Encounter for screening for malignant neoplasm of colon: Secondary | ICD-10-CM

## 2022-01-04 DIAGNOSIS — Z01419 Encounter for gynecological examination (general) (routine) without abnormal findings: Secondary | ICD-10-CM

## 2022-01-04 DIAGNOSIS — Z90721 Acquired absence of ovaries, unilateral: Secondary | ICD-10-CM

## 2022-01-04 DIAGNOSIS — Z90710 Acquired absence of both cervix and uterus: Secondary | ICD-10-CM

## 2022-01-04 DIAGNOSIS — R102 Pelvic and perineal pain: Secondary | ICD-10-CM | POA: Diagnosis not present

## 2022-01-04 DIAGNOSIS — Z124 Encounter for screening for malignant neoplasm of cervix: Secondary | ICD-10-CM | POA: Diagnosis not present

## 2022-01-04 LAB — HEMOCCULT GUIAC POC 1CARD (OFFICE): Fecal Occult Blood, POC: NEGATIVE

## 2022-01-04 NOTE — Progress Notes (Signed)
Patient ID: Destiny Manning, female   DOB: Apr 04, 1971, 51 y.o.   MRN: [id number] History of Present Illness: Destiny Manning is a 51 year old black female, single sp hysterectomy and BSO, in for well woman gyn exam. She had wellness exam with PCP 09/21/21. She is complaining of pelvic pain, in area where ovaries used to be. Has not had sex since 2016.  PCP is L [name] NP    Current Medications, Allergies, Past Medical History, Past Surgical History, Family History and Social History were reviewed in [employer] record.     Review of Systems: Patient denies any headaches, hearing loss, fatigue, blurred vision, shortness of breath, chest pain, abdominal pain, problems with bowel movements, urination, or intercourse(not active). No joint pain or mood swings.  +pelvic pain Has had diarrhea recently   Physical Exam:BP 125/80 (BP Location: Left Arm, Patient Position: Sitting, Cuff Size: Normal)   Pulse 81   Ht 5' 3.5" (1.613 m)   Wt 147 lb (66.7 kg)   LMP 07/04/2010   BMI 25.63 kg/m   General:  Well developed, well nourished, no acute distress Skin:  Warm and dry Neck:  Midline trachea, enlarged thyroid(had benign biopsy), good ROM, no lymphadenopathy Lungs; Clear to auscultation bilaterally Breast:  No dominant palpable mass, retraction, or nipple discharge Cardiovascular: Regular rate and rhythm Abdomen:  Soft, non tender, no hepatosplenomegaly Pelvic:  External genitalia is normal in appearance, no lesions.  The vagina is pale. Urethra has no lesions or masses. The cervix and uterus are absent.  No adnexal masses, LLQ tenderness noted.Bladder is non tender, no masses felt. Rectal: Good sphincter tone, no polyps, or hemorrhoids felt.  Hemoccult negative. Extremities/musculoskeletal:  No swelling or varicosities noted, no clubbing or cyanosis Psych:  No mood changes, alert and cooperative,seems happy AA is 0 Fall risk is low    01/04/2022   10:19 AM 09/21/2021    9:10 AM  05/19/2021    2:32 PM  Depression screen PHQ 2/9  Decreased Interest 0 0 0  Down, Depressed, Hopeless 0 0 0  PHQ - 2 Score 0 0 0  Altered sleeping 0    Tired, decreased energy 0    Change in appetite 0    Feeling bad or failure about yourself  0    Trouble concentrating 0    Moving slowly or fidgety/restless 0    Suicidal thoughts 0    PHQ-9 Score 0         01/04/2022   10:19 AM 11/02/2020   10:38 AM 10/31/2019    9:58 AM  GAD 7 : Generalized Anxiety Score  Nervous, Anxious, on Edge 0 0 0  Control/stop worrying 0 0 0  Worry too much - different things 0 0 0  Trouble relaxing 0 0 0  Restless 0 0 0  Easily annoyed or irritable 0 0 0  Afraid - awful might happen 0 0 0  Total GAD 7 Score 0 0 0  Anxiety Difficulty   Not difficult at all      Upstream - 01/04/22 1032       Pregnancy Intention Screening   Does the patient want to become pregnant in the next year? N/A    Does the patient's partner want to become pregnant in the next year? N/A    Would the patient like to discuss contraceptive options today? N/A      Contraception Wrap Up   Current Method Female Sterilization   hyst   End Method  Female Sterilization   hyst   Contraception Counseling Provided No            Examination chaperoned by Levy Pupa LPN   Impression and Plan: 1. Encounter for well woman exam with routine gynecological exam Physical with PCP GYN exam in 1 year Colonoscopy per GI Mammogram  was negative 07/04/21  Labs with PCP  2. Encounter for screening fecal occult blood testing Hemoccult was negative   3. Pelvic pain in female I feel this may be bowel related to see GI  4. Current use of estrogen therapy Continue Estrace 2 mg was refilled by Dr Elonda Husky   5. S/P hysterectomy with oophorectomy

## 2022-01-05 ENCOUNTER — Other Ambulatory Visit: Payer: Self-pay | Admitting: Family Medicine

## 2022-01-06 ENCOUNTER — Encounter: Payer: Self-pay | Admitting: Nurse Practitioner

## 2022-01-06 ENCOUNTER — Other Ambulatory Visit: Payer: Self-pay | Admitting: Internal Medicine

## 2022-01-07 ENCOUNTER — Other Ambulatory Visit: Payer: Self-pay | Admitting: Nurse Practitioner

## 2022-01-07 MED ORDER — ATORVASTATIN CALCIUM 10 MG PO TABS
10.0000 mg | ORAL_TABLET | Freq: Every day | ORAL | 3 refills | Status: DC
Start: 2022-01-07 — End: 2023-04-06

## 2022-01-07 MED ORDER — MONTELUKAST SODIUM 10 MG PO TABS: 10.0000 mg | ORAL_TABLET | Freq: Every day | ORAL | 0 refills | Status: DC

## 2022-01-07 MED ORDER — LOSARTAN POTASSIUM 100 MG PO TABS
100.0000 mg | ORAL_TABLET | Freq: Every day | ORAL | 1 refills | Status: DC
Start: 2022-01-07 — End: 2022-04-07

## 2022-02-02 ENCOUNTER — Other Ambulatory Visit: Payer: Self-pay | Admitting: Gastroenterology

## 2022-02-02 ENCOUNTER — Other Ambulatory Visit: Payer: Self-pay | Admitting: Nurse Practitioner

## 2022-02-02 DIAGNOSIS — E876 Hypokalemia: Secondary | ICD-10-CM

## 2022-02-21 ENCOUNTER — Ambulatory Visit: Payer: Medicaid Other | Admitting: Family Medicine

## 2022-02-21 ENCOUNTER — Encounter: Payer: Self-pay | Admitting: Family Medicine

## 2022-02-21 VITALS — BP 127/77 | HR 88 | Temp 98.2°F | Wt 145.8 lb

## 2022-02-21 DIAGNOSIS — K1120 Sialoadenitis, unspecified: Secondary | ICD-10-CM | POA: Insufficient documentation

## 2022-02-21 MED ORDER — AMOXICILLIN-POT CLAVULANATE 875-125 MG PO TABS: 1.0000 | ORAL_TABLET | Freq: Two times a day (BID) | ORAL | 0 refills | Status: DC

## 2022-02-21 NOTE — Patient Instructions (Signed)
Medication as prescribed.  If you worsen or fail to improve, please let me know.  Call with concerns.  Take care  Dr. Lacinda Axon

## 2022-02-21 NOTE — Progress Notes (Signed)
Subjective:  Patient ID: Destiny Manning, female    DOB: [birth year]  Age: 51 y.o. MRN: [id number]  CC: Chief Complaint  Patient presents with   Adenopathy    Pt arrives with glands swollen on left side. Pt states at night is when it flares up and throbs. Began on Saturday evening all of a sudden. No other symptoms.     HPI:  51 year old female presents for evaluation of the above.  Patient reports an area of swelling underneath the left mandible.  Started on Saturday.  No preceding symptoms.  Patient states that she is otherwise feeling well.  The area is tender to palpation.  No fever.  No current respiratory symptoms.  No sore throat.  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 11/16/2016   Hypertension 11/16/2016   Seasonal allergies 11/16/2016   Asthma in adult, mild persistent, uncomplicated 48/18/5631   Chronic lower back pain 11/16/2016   H/O cervical spine surgery 11/16/2016   Gastroesophageal reflux disease 08/13/2012    Social Hx   Social History   Socioeconomic History   Marital status: Single    Spouse name: Not on file   Number of children: 2   Years of education: 16   Highest education level: Not on file  Occupational History   Occupation: [employer]    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  Tobacco Use   Smoking status: Former    Packs/day: 0.25    Years: 4.00    Total pack years: 1.00    Types: Cigarettes    Quit date: 03/14/2009    Years since quitting: 12.9    Smokeless tobacco: Never  Vaping Use   Vaping Use: Never used  Substance and Sexual Activity   Alcohol use: No   Drug use: No   Sexual activity: Not Currently    Birth control/protection: Surgical    Comment: hyst  Other Topics Concern   Not on file  Social History Narrative   Actuary at [employer]; 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      Lives with 2 children- age 7 and 34; she homeschools them   Social Determinants of Health   Financial Resource Strain: Low Risk  (01/04/2022)   Overall Financial Resource Strain (CARDIA)    Difficulty of Paying Living Expenses: Not hard at all  Food Insecurity: No Food Insecurity (01/04/2022)   Hunger Vital Sign    Worried About Running Out of Food in the Last Year: Never true    Ran Out of Food in the Last Year: Never true  Transportation Needs: No Transportation Needs (01/04/2022)   PRAPARE - Hydrologist (Medical): No    Lack of Transportation (Non-Medical): No  Physical Activity: Inactive (01/04/2022)   Exercise Vital Sign    Days of Exercise per Week: 0 days    Minutes of Exercise per Session: 0 min  Stress: No Stress Concern Present (01/04/2022)   [place]    Feeling of Stress : Not at  all  Social Connections: Moderately Isolated (01/04/2022)   Social Connection and Isolation Panel [NHANES]    Frequency of Communication with Friends and Family: More than three times a week    Frequency of Social Gatherings with Friends and Family: Once a week    Attends Religious Services: 1 to 4 times per year    Active Member of [employer] or Organizations: No    Attends Music therapist: Never    Marital Status: Never married    Review of Systems Per HPI  Objective:  BP 127/77   Pulse 88   Temp 98.2 F (36.8 C)   Wt 145 lb 12.8 oz (66.1 kg)   LMP 07/04/2010   SpO2 100%   BMI 25.42 kg/m      02/21/2022   10:40 AM 01/04/2022   10:19  AM 11/25/2021    8:24 AM  BP/Weight  Systolic BP 056 979   Diastolic BP 77 80   Wt. (Lbs) 145.8 147 143  BMI 25.42 kg/m2 25.63 kg/m2 25.33 kg/m2    Physical Exam Constitutional:      General: She is not in acute distress.    Appearance: Normal appearance. She is not ill-appearing.  HENT:     Head: Normocephalic and atraumatic.  Neck:     Comments: Tender and enlarged area underneath the left side of the mandible. Cardiovascular:     Rate and Rhythm: Normal rate and regular rhythm.  Pulmonary:     Effort: Pulmonary effort is normal.     Breath sounds: Normal breath sounds.  Neurological:     Mental Status: She is alert.  Psychiatric:        Mood and Affect: Mood normal.        Behavior: Behavior normal.     Lab Results  Component Value Date   WBC 7.5 12/09/2020   HGB 13.9 12/15/2020   HCT 41.0 12/15/2020   PLT 253 12/09/2020   GLUCOSE 97 09/21/2021   CHOL 165 09/21/2021   TRIG 174 (H) 09/21/2021   HDL 40 09/21/2021   LDLCALC 95 09/21/2021   ALT 6 09/21/2021   AST 15 09/21/2021   NA 139 09/21/2021   K 3.2 (L) 09/21/2021   CL 98 09/21/2021   CREATININE 0.96 09/21/2021   BUN 10 09/21/2021   CO2 25 09/21/2021   TSH 0.480 11/02/2021     Assessment & Plan:   Problem List Items Addressed This Visit       Digestive   Sialadenitis - Primary    Based on presentation and physical exam I am concerned that she has sialadenitis.  Treating empirically with Augmentin.  If fails to improve or worsens, will need imaging.       Meds ordered this encounter  Medications   amoxicillin-clavulanate (AUGMENTIN) 875-125 MG tablet    Sig: Take 1 tablet by mouth 2 (two) times daily.    Dispense:  20 tablet    Refill:  0    Follow-up:  Return if symptoms worsen or fail to improve.  [place]

## 2022-02-21 NOTE — Assessment & Plan Note (Signed)
Based on presentation and physical exam I am concerned that she has sialadenitis.  Treating empirically with Augmentin.  If fails to improve or worsens, will need imaging.

## 2022-03-03 ENCOUNTER — Other Ambulatory Visit: Payer: Self-pay | Admitting: Nurse Practitioner

## 2022-03-03 ENCOUNTER — Other Ambulatory Visit: Payer: Self-pay | Admitting: Internal Medicine

## 2022-03-03 DIAGNOSIS — G8929 Other chronic pain: Secondary | ICD-10-CM

## 2022-03-07 ENCOUNTER — Other Ambulatory Visit: Payer: Self-pay | Admitting: Family Medicine

## 2022-03-07 DIAGNOSIS — E876 Hypokalemia: Secondary | ICD-10-CM

## 2022-03-23 ENCOUNTER — Ambulatory Visit: Payer: Medicaid Other | Admitting: Nurse Practitioner

## 2022-04-05 ENCOUNTER — Other Ambulatory Visit: Payer: Self-pay | Admitting: Gastroenterology

## 2022-04-05 DIAGNOSIS — K219 Gastro-esophageal reflux disease without esophagitis: Secondary | ICD-10-CM

## 2022-04-07 ENCOUNTER — Ambulatory Visit: Payer: Medicaid Other | Admitting: Family Medicine

## 2022-04-07 ENCOUNTER — Encounter: Payer: Self-pay | Admitting: Family Medicine

## 2022-04-07 DIAGNOSIS — M545 Low back pain, unspecified: Secondary | ICD-10-CM | POA: Diagnosis not present

## 2022-04-07 DIAGNOSIS — I10 Essential (primary) hypertension: Secondary | ICD-10-CM | POA: Diagnosis not present

## 2022-04-07 DIAGNOSIS — M25511 Pain in right shoulder: Secondary | ICD-10-CM

## 2022-04-07 DIAGNOSIS — E042 Nontoxic multinodular goiter: Secondary | ICD-10-CM

## 2022-04-07 DIAGNOSIS — J4530 Mild persistent asthma, uncomplicated: Secondary | ICD-10-CM

## 2022-04-07 DIAGNOSIS — G8929 Other chronic pain: Secondary | ICD-10-CM

## 2022-04-07 DIAGNOSIS — Z130 Encounter for screening for diseases of the blood and blood-forming organs and certain disorders involving the immune mechanism: Secondary | ICD-10-CM

## 2022-04-07 DIAGNOSIS — G43819 Other migraine, intractable, without status migrainosus: Secondary | ICD-10-CM | POA: Diagnosis not present

## 2022-04-07 DIAGNOSIS — Z23 Encounter for immunization: Secondary | ICD-10-CM

## 2022-04-07 DIAGNOSIS — Z8679 Personal history of other diseases of the circulatory system: Secondary | ICD-10-CM | POA: Insufficient documentation

## 2022-04-07 DIAGNOSIS — E785 Hyperlipidemia, unspecified: Secondary | ICD-10-CM

## 2022-04-07 DIAGNOSIS — T7840XD Allergy, unspecified, subsequent encounter: Secondary | ICD-10-CM

## 2022-04-07 DIAGNOSIS — I773 Arterial fibromuscular dysplasia: Secondary | ICD-10-CM | POA: Insufficient documentation

## 2022-04-07 DIAGNOSIS — I700 Atherosclerosis of aorta: Secondary | ICD-10-CM | POA: Insufficient documentation

## 2022-04-07 MED ORDER — SUMATRIPTAN SUCCINATE 50 MG PO TABS: 50.0000 mg | ORAL_TABLET | Freq: Two times a day (BID) | ORAL | 2 refills | Status: DC | PRN

## 2022-04-07 MED ORDER — FLUTICASONE-SALMETEROL 500-50 MCG/ACT IN AEPB: 1.0000 | INHALATION_SPRAY | Freq: Two times a day (BID) | RESPIRATORY_TRACT | 6 refills | Status: DC

## 2022-04-07 MED ORDER — LOSARTAN POTASSIUM 100 MG PO TABS: 100.0000 mg | ORAL_TABLET | Freq: Every day | ORAL | 1 refills | Status: DC

## 2022-04-07 MED ORDER — FLUTICASONE PROPIONATE 50 MCG/ACT NA SUSP: 1.0000 | Freq: Every day | NASAL | 3 refills | Status: DC

## 2022-04-07 MED ORDER — ALBUTEROL SULFATE HFA 108 (90 BASE) MCG/ACT IN AERS: 2.0000 | INHALATION_SPRAY | Freq: Four times a day (QID) | RESPIRATORY_TRACT | 1 refills | Status: DC | PRN

## 2022-04-07 MED ORDER — GABAPENTIN 800 MG PO TABS: 800.0000 mg | ORAL_TABLET | Freq: Three times a day (TID) | ORAL | 1 refills | Status: DC

## 2022-04-07 MED ORDER — BACLOFEN 10 MG PO TABS: 10.0000 mg | ORAL_TABLET | Freq: Three times a day (TID) | ORAL | 1 refills | Status: DC | PRN

## 2022-04-07 MED ORDER — MONTELUKAST SODIUM 10 MG PO TABS: 10.0000 mg | ORAL_TABLET | Freq: Every day | ORAL | 1 refills | Status: DC

## 2022-04-07 NOTE — Assessment & Plan Note (Addendum)
Well controlled. Continue current medications. Medications refilled today. Labs today to assess for hypokalemia.  May need change in chlorthalidone if hypokalemic.

## 2022-04-07 NOTE — Progress Notes (Signed)
Subjective:  Patient ID: Destiny Manning, female    DOB: 1970-06-23  Age: 51 y.o. MRN: [id number]  CC: Chief Complaint  Patient presents with   Follow-up    Sodium/potassium    HPI:  51 year old female with extensive past medical history including atherosclerosis, fibromuscular dysplasia, hypertension, migraine, asthma, GERD, multinodular goiter, history of cardiomyopathy, hyperlipidemia presents for follow-up.  Patient has previously had issues with hypokalemia.  She states that she is here to get her potassium rechecked.  She is on chlorthalidone.  This is the likely contributing factor to hypokalemia.  Will recheck today.  Hypertension is well-controlled currently on chlorthalidone, losartan, and carvedilol.  Patient is in need of medication refills.  Asthma stable on Advair, Singulair.  Fair control of lipids.  However, I would prefer LDL less than 70 given patient's atherosclerosis.  She is currently on atorvastatin.  Needs lipid panel today.  Patient follows with endocrinology regarding multinodular goiter.    Migraines stable on Imitrex.  Patient Active Problem List   Diagnosis Date Noted   History of cardiomyopathy 04/07/2022   Fibromuscular dysplasia ([place]) 04/07/2022   Atherosclerosis of aorta ([place]) 04/07/2022   S/P hysterectomy with oophorectomy 01/04/2022   Body mass index (BMI) 25.0-25.9, adult 09/21/2021   DDD (degenerative disc disease), cervical 09/21/2021   Hyperlipidemia, unspecified 09/21/2021   Migraine 09/21/2021   Chronic right shoulder pain 05/19/2021   Multinodular goiter 09/23/2020   Hypertension 11/16/2016   Asthma in adult, mild persistent, uncomplicated 40/97/3532   Chronic lower back pain 11/16/2016   Gastroesophageal reflux disease 08/13/2012    Social Hx   Social History   Socioeconomic History   Marital status: Single    Spouse name: Not on file   Number of children: 2   Years of education: 16   Highest education level: Not on file   Occupational History   Occupation: [employer]    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  Tobacco Use   Smoking status: Former    Packs/day: 0.25    Years: 4.00    Total pack years: 1.00    Types: Cigarettes    Quit date: 03/14/2009    Years since quitting: 13.0   Smokeless tobacco: Never  Vaping Use   Vaping Use: Never used  Substance and Sexual Activity   Alcohol use: No   Drug use: No   Sexual activity: Not Currently    Birth control/protection: Surgical    Comment: hyst  Other Topics Concern   Not on file  Social History Narrative   Actuary at [employer]; 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      Lives with 2 children- age 59 and 34; she homeschools them   Social Determinants of Health   Financial Resource Strain: Low Risk  (01/04/2022)   Overall Financial Resource Strain (CARDIA)    Difficulty of Paying Living Expenses: Not hard at all  Food Insecurity: No Food Insecurity (01/04/2022)   Hunger Vital Sign    Worried About Running Out of Food in the Last Year: Never true    Ran Out of Food in the Last Year: Never true  Transportation Needs: No Transportation Needs (01/04/2022)   PRAPARE - Hydrologist (Medical): No    Lack of Transportation (Non-Medical): No  Physical Activity: Inactive (01/04/2022)   Exercise Vital Sign    Days of Exercise per Week: 0 days    Minutes of Exercise per Session: 0 min  Stress: No Stress Concern Present (01/04/2022)  [employer] - Occupational Stress Questionnaire    Feeling of Stress : Not at all  Social Connections: Moderately Isolated (01/04/2022)   Social Connection and Isolation Panel [NHANES]    Frequency of Communication with Friends and Family: More than three times a week    Frequency of Social Gatherings with Friends and Family: Once a week    Attends Religious Services: 1 to 4 times per year    Active Member of [employer] or Organizations: No    Attends Programme researcher, broadcasting/film/video: Never    Marital Status: Never married    Review of Systems Per HPI  Objective:  BP 118/77   Pulse 90   Temp (!) 97.2 F (36.2 C)   Ht 5' 3.5" (1.613 m)   Wt 145 lb (65.8 kg)   LMP 07/04/2010   SpO2 99%   BMI 25.28 kg/m      04/07/2022   10:18 AM 02/21/2022   10:40 AM 01/04/2022   10:19 AM  BP/Weight  Systolic BP 093 235 573  Diastolic BP 77 77 80  Wt. (Lbs) 145 145.8 147  BMI 25.28 kg/m2 25.42 kg/m2 25.63 kg/m2    Physical Exam Vitals and nursing note reviewed.  Constitutional:      General: She is not in acute distress.    Appearance: Normal appearance.  HENT:     Head: Normocephalic and atraumatic.  Cardiovascular:     Rate and Rhythm: Normal rate and regular rhythm.  Pulmonary:     Effort: Pulmonary effort is normal.     Breath sounds: Normal breath sounds. No wheezing, rhonchi or rales.  Neurological:     Mental Status: She is alert.  Psychiatric:        Mood and Affect: Mood normal.        Behavior: Behavior normal.     Lab Results  Component Value Date   WBC 7.5 12/09/2020   HGB 13.9 12/15/2020   HCT 41.0 12/15/2020   PLT 253 12/09/2020   GLUCOSE 97 09/21/2021   CHOL 165 09/21/2021   TRIG 174 (H) 09/21/2021   HDL 40 09/21/2021   LDLCALC 95 09/21/2021   ALT 6 09/21/2021   AST 15 09/21/2021   NA 139 09/21/2021   K 3.2 (L) 09/21/2021   CL 98 09/21/2021   CREATININE 0.96 09/21/2021   BUN 10 09/21/2021   CO2 25 09/21/2021   TSH 0.480 11/02/2021     Assessment & Plan:   Problem List Items Addressed This Visit       Cardiovascular and Mediastinum   Hypertension    Well controlled. Continue current medications. Medications refilled today. Labs today to assess for hypokalemia.  May need change in chlorthalidone if hypokalemic.      Relevant Medications   losartan (COZAAR) 100 MG tablet   Other Relevant Orders   CMP14+EGFR   Migraine    Continue Imitrex.      Relevant Medications   gabapentin (NEURONTIN)  800 MG tablet   losartan (COZAAR) 100 MG tablet   SUMAtriptan (IMITREX) 50 MG tablet   baclofen (LIORESAL) 10 MG tablet     Respiratory   Asthma in adult, mild persistent, uncomplicated    Stable.  Current medications.      Relevant Medications   albuterol (VENTOLIN HFA) 108 (90 Base) MCG/ACT inhaler   montelukast (SINGULAIR) 10 MG tablet   fluticasone-salmeterol (ADVAIR DISKUS) 500-50 MCG/ACT AEPB     Endocrine   Multinodular goiter  Stable.  Followed by endocrinology.        Other   Chronic lower back pain   Relevant Medications   gabapentin (NEURONTIN) 800 MG tablet   baclofen (LIORESAL) 10 MG tablet   Chronic right shoulder pain   Relevant Medications   gabapentin (NEURONTIN) 800 MG tablet   baclofen (LIORESAL) 10 MG tablet   Hyperlipidemia, unspecified    Lipid panel today to assess.  Goal less than 70.  Currently on atorvastatin.  Will continue.  Will likely need dose increase.      Relevant Medications   losartan (COZAAR) 100 MG tablet   Other Relevant Orders   Lipid panel   Other Visit Diagnoses     Immunization due       Relevant Orders   Flu Vaccine QUAD 76moIM (Fluarix, Fluzone & Alfiuria Quad PF) (Completed)   Allergy, subsequent encounter       Relevant Medications   fluticasone (FLONASE) 50 MCG/ACT nasal spray   montelukast (SINGULAIR) 10 MG tablet   Screening for deficiency anemia       Relevant Orders   CBC       Meds ordered this encounter  Medications   albuterol (VENTOLIN HFA) 108 (90 Base) MCG/ACT inhaler    Sig: Inhale 2 puffs into the lungs every 6 (six) hours as needed for wheezing or shortness of breath.    Dispense:  18 each    Refill:  1   fluticasone (FLONASE) 50 MCG/ACT nasal spray    Sig: Place 1 spray into both nostrils daily.    Dispense:  16 g    Refill:  3   gabapentin (NEURONTIN) 800 MG tablet    Sig: Take 1 tablet (800 mg total) by mouth 3 (three) times daily.    Dispense:  270 tablet    Refill:  1   losartan  (COZAAR) 100 MG tablet    Sig: Take 1 tablet (100 mg total) by mouth daily.    Dispense:  90 tablet    Refill:  1   montelukast (SINGULAIR) 10 MG tablet    Sig: Take 1 tablet (10 mg total) by mouth daily.    Dispense:  90 tablet    Refill:  1   SUMAtriptan (IMITREX) 50 MG tablet    Sig: Take 1 tablet (50 mg total) by mouth 2 (two) times daily as needed for migraine. May repeat in 2 hours if headache persists or recurs.    Dispense:  20 tablet    Refill:  2   baclofen (LIORESAL) 10 MG tablet    Sig: Take 1 tablet (10 mg total) by mouth 3 (three) times daily as needed for muscle spasms.    Dispense:  90 tablet    Refill:  1   fluticasone-salmeterol (ADVAIR DISKUS) 500-50 MCG/ACT AEPB    Sig: Inhale 1 puff into the lungs in the morning and at bedtime.    Dispense:  60 each    Refill:  6    Follow-up:  Return in about 6 months (around 10/07/2022).  [place]

## 2022-04-07 NOTE — Patient Instructions (Signed)
Labs ordered.  I have refilled your medications.  Follow up in 6 months.

## 2022-04-07 NOTE — Assessment & Plan Note (Signed)
Stable.  Followed by endocrinology.

## 2022-04-07 NOTE — Assessment & Plan Note (Signed)
Stable.  Current medications.

## 2022-04-07 NOTE — Assessment & Plan Note (Signed)
Continue Imitrex.

## 2022-04-07 NOTE — Assessment & Plan Note (Signed)
Lipid panel today to assess.  Goal less than 70.  Currently on atorvastatin.  Will continue.  Will likely need dose increase.

## 2022-04-08 LAB — CMP14+EGFR
ALT: 6 IU/L (ref 0–32)
AST: 13 IU/L (ref 0–40)
Albumin/Globulin Ratio: 1.7 (ref 1.2–2.2)
Albumin: 4.3 g/dL (ref 3.9–4.9)
Alkaline Phosphatase: 63 IU/L (ref 44–121)
BUN/Creatinine Ratio: 11 (ref 9–23)
BUN: 11 mg/dL (ref 6–24)
Bilirubin Total: 0.3 mg/dL (ref 0.0–1.2)
CO2: 27 mmol/L (ref 20–29)
Calcium: 9.6 mg/dL (ref 8.7–10.2)
Chloride: 100 mmol/L (ref 96–106)
Creatinine, Ser: 0.96 mg/dL (ref 0.57–1.00)
Globulin, Total: 2.6 g/dL (ref 1.5–4.5)
Glucose: 96 mg/dL (ref 70–99)
Potassium: 3.3 mmol/L — ABNORMAL LOW (ref 3.5–5.2)
Sodium: 141 mmol/L (ref 134–144)
Total Protein: 6.9 g/dL (ref 6.0–8.5)
eGFR: 73 mL/min/{1.73_m2} (ref >59)

## 2022-04-08 LAB — CBC
Hematocrit: 45.9 % (ref 34.0–46.6)
Hemoglobin: 15.5 g/dL (ref 11.1–15.9)
MCH: 30.6 pg (ref 26.6–33.0)
MCHC: 33.8 g/dL (ref 31.5–35.7)
MCV: 91 fL (ref 79–97)
Platelets: 311 10*3/uL (ref 150–450)
RBC: 5.06 x10E6/uL (ref 3.77–5.28)
RDW: 13.7 % (ref 11.7–15.4)
WBC: 7.3 10*3/uL (ref 3.4–10.8)

## 2022-04-08 LAB — LIPID PANEL
Chol/HDL Ratio: 3 ratio (ref 0.0–4.4)
Cholesterol, Total: 163 mg/dL (ref 100–199)
HDL: 54 mg/dL (ref >39)
LDL Chol Calc (NIH): 81 mg/dL (ref 0–99)
Triglycerides: 162 mg/dL — ABNORMAL HIGH (ref 0–149)
VLDL Cholesterol Cal: 28 mg/dL (ref 5–40)

## 2022-04-09 ENCOUNTER — Other Ambulatory Visit: Payer: Self-pay | Admitting: Family Medicine

## 2022-04-09 MED ORDER — POTASSIUM CHLORIDE CRYS ER 10 MEQ PO TBCR: 20.0000 meq | EXTENDED_RELEASE_TABLET | Freq: Two times a day (BID) | ORAL | 0 refills | Status: DC

## 2022-05-01 ENCOUNTER — Other Ambulatory Visit: Payer: Self-pay | Admitting: Family Medicine

## 2022-05-01 DIAGNOSIS — G8929 Other chronic pain: Secondary | ICD-10-CM

## 2022-05-11 ENCOUNTER — Ambulatory Visit: Payer: Medicaid Other | Admitting: Family Medicine

## 2022-05-11 VITALS — BP 130/79 | Ht 63.5 in | Wt 148.2 lb

## 2022-05-11 DIAGNOSIS — L659 Nonscarring hair loss, unspecified: Secondary | ICD-10-CM | POA: Diagnosis not present

## 2022-05-11 MED ORDER — MINOXIDIL FOR WOMEN 5 % EX FOAM: CUTANEOUS | 3 refills | Status: DC

## 2022-05-11 NOTE — Assessment & Plan Note (Signed)
Alopecia versus female pattern hair loss.  Minoxidil as directed.  Referring to dermatology.

## 2022-05-11 NOTE — Progress Notes (Signed)
Subjective:  Patient ID: Destiny Manning, female    DOB: Aug 23, 1970  Age: 52 y.o. MRN: [id number]  CC: Chief Complaint  Patient presents with   bald spot     Patient states she noticed a new bald spot in her hair on left side of her head- it was not there previously    HPI:  52 year old female next past medical history presents for evaluation of the above.  Patient reports that she has noticed a discrete area of hair loss since this past Thursday.  Patient is very concerned.  She would like me to examine it today.  She states otherwise she is feeling well.  No other complaints or concerns at this time.  Patient Active Problem List   Diagnosis Date Noted   Hair loss 05/11/2022   History of cardiomyopathy 04/07/2022   Fibromuscular dysplasia ([place]) 04/07/2022   Atherosclerosis of aorta ([place]) 04/07/2022   S/P hysterectomy with oophorectomy 01/04/2022   DDD (degenerative disc disease), cervical 09/21/2021   Hyperlipidemia, unspecified 09/21/2021   Migraine 09/21/2021   Chronic right shoulder pain 05/19/2021   Multinodular goiter 09/23/2020   Hypertension 11/16/2016   Asthma in adult, mild persistent, uncomplicated 67/03/4579   Chronic lower back pain 11/16/2016   Gastroesophageal reflux disease 08/13/2012    Social Hx   Social History   Socioeconomic History   Marital status: Single    Spouse name: Not on file   Number of children: 2   Years of education: 16   Highest education level: Not on file  Occupational History   Occupation: [employer]    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  Tobacco Use   Smoking status: Former    Packs/day: 0.25    Years: 4.00    Total pack years: 1.00    Types: Cigarettes    Quit date: 03/14/2009    Years since quitting: 13.1   Smokeless tobacco: Never  Vaping Use   Vaping Use: Never used  Substance and Sexual Activity   Alcohol use: No   Drug use: No   Sexual activity: Not Currently    Birth control/protection: Surgical     Comment: hyst  Other Topics Concern   Not on file  Social History Narrative   Actuary at [employer]; 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      Lives with 2 children- age 75 and 52; she homeschools them   Social Determinants of Health   Financial Resource Strain: Low Risk  (01/04/2022)   Overall Financial Resource Strain (CARDIA)    Difficulty of Paying Living Expenses: Not hard at all  Food Insecurity: No Food Insecurity (01/04/2022)   Hunger Vital Sign    Worried About Running Out of Food in the Last Year: Never true    Ran Out of Food in the Last Year: Never true  Transportation Needs: No Transportation Needs (01/04/2022)   PRAPARE - Hydrologist (Medical): No    Lack of Transportation (Non-Medical): No  Physical Activity: Inactive (01/04/2022)   Exercise Vital Sign    Days of Exercise per Week: 0 days    Minutes of Exercise per Session: 0 min  Stress: No Stress Concern Present (01/04/2022)   [place]    Feeling of Stress : Not at all  Social Connections: Moderately Isolated (01/04/2022)   Social Connection and Isolation Panel [NHANES]    Frequency of Communication with Friends and Family: More than three times a week  Frequency of Social Gatherings with Friends and Family: Once a week    Attends Religious Services: 1 to 4 times per year    Active Member of Clubs or Organizations: No    Attends Archivist Meetings: Never    Marital Status: Never married    Review of Systems Per HPI  Objective:  BP 130/79   Ht 5' 3.5" (1.613 m)   Wt 148 lb 3.2 oz (67.2 kg)   LMP 07/04/2010   BMI 25.84 kg/m      05/11/2022   11:09 AM 04/07/2022   10:18 AM 02/21/2022   10:40 AM  BP/Weight  Systolic BP 915 056 979  Diastolic BP 79 77 77  Wt. (Lbs) 148.2 145 145.8  BMI 25.84 kg/m2 25.28 kg/m2 25.42 kg/m2    Physical Exam Vitals and nursing note reviewed.  Constitutional:       General: She is not in acute distress.    Appearance: Normal appearance.  HENT:     Head:     Comments: Significant hair thinning noted throughout.  There is a more discrete area around the crown. Eyes:     General:        Right eye: No discharge.        Left eye: No discharge.     Conjunctiva/sclera: Conjunctivae normal.  Pulmonary:     Effort: Pulmonary effort is normal.  Neurological:     Mental Status: She is alert.  Psychiatric:        Mood and Affect: Mood normal.        Behavior: Behavior normal.     Lab Results  Component Value Date   WBC 7.3 04/07/2022   HGB 15.5 04/07/2022   HCT 45.9 04/07/2022   PLT 311 04/07/2022   GLUCOSE 96 04/07/2022   CHOL 163 04/07/2022   TRIG 162 (H) 04/07/2022   HDL 54 04/07/2022   LDLCALC 81 04/07/2022   ALT 6 04/07/2022   AST 13 04/07/2022   NA 141 04/07/2022   K 3.3 (L) 04/07/2022   CL 100 04/07/2022   CREATININE 0.96 04/07/2022   BUN 11 04/07/2022   CO2 27 04/07/2022   TSH 0.480 11/02/2021     Assessment & Plan:   Problem List Items Addressed This Visit       Other   Hair loss - Primary    Alopecia versus female pattern hair loss.  Minoxidil as directed.  Referring to dermatology.      Relevant Orders   Ambulatory referral to Dermatology    Meds ordered this encounter  Medications   Minoxidil (MINOXIDIL FOR WOMEN) 5 % FOAM    Sig: Apply 1/2 capful to scalp/hair once daily.    Dispense:  60 g    Refill:  [place]

## 2022-05-11 NOTE — Patient Instructions (Signed)
Medication as directed.  Referral placed. 

## 2022-05-12 ENCOUNTER — Ambulatory Visit: Payer: Medicaid Other | Admitting: Family Medicine

## 2022-05-12 ENCOUNTER — Encounter: Payer: Self-pay | Admitting: Family Medicine

## 2022-05-14 ENCOUNTER — Ambulatory Visit
Admission: EM | Admit: 2022-05-14 | Discharge: 2022-05-14 | Disposition: A | Discharge status: Home / Self Care | Payer: Medicaid Other | Attending: Nurse Practitioner | Admitting: Nurse Practitioner

## 2022-05-14 ENCOUNTER — Ambulatory Visit (INDEPENDENT_AMBULATORY_CARE_PROVIDER_SITE_OTHER): Payer: Medicaid Other

## 2022-05-14 DIAGNOSIS — R062 Wheezing: Secondary | ICD-10-CM | POA: Diagnosis not present

## 2022-05-14 DIAGNOSIS — I509 Heart failure, unspecified: Secondary | ICD-10-CM

## 2022-05-14 DIAGNOSIS — R0602 Shortness of breath: Secondary | ICD-10-CM

## 2022-05-14 DIAGNOSIS — Z8709 Personal history of other diseases of the respiratory system: Secondary | ICD-10-CM

## 2022-05-14 DIAGNOSIS — R059 Cough, unspecified: Secondary | ICD-10-CM | POA: Diagnosis not present

## 2022-05-14 MED ORDER — ALBUTEROL SULFATE (2.5 MG/3ML) 0.083% IN NEBU: 2.5000 mg | INHALATION_SOLUTION | Freq: Four times a day (QID) | RESPIRATORY_TRACT | 0 refills | Status: DC | PRN

## 2022-05-14 MED ORDER — METHYLPREDNISOLONE SODIUM SUCC 125 MG IJ SOLR
80.0000 mg | Freq: Once | INTRAMUSCULAR | Status: AC
  Administered 2022-05-14: 80 mg via INTRAMUSCULAR

## 2022-05-14 MED ORDER — PREDNISONE 50 MG PO TABS: ORAL_TABLET | ORAL | 0 refills | Status: DC

## 2022-05-14 MED ORDER — AMOXICILLIN-POT CLAVULANATE 875-125 MG PO TABS: 1.0000 | ORAL_TABLET | Freq: Two times a day (BID) | ORAL | 0 refills | Status: DC

## 2022-05-14 NOTE — ED Triage Notes (Signed)
Pt reports some wheezing, coughing with thick mucus and chest tightness x 5 days.   States asthma meds are not helping.

## 2022-05-14 NOTE — ED Provider Notes (Signed)
[facility]    CSN: [id number] Arrival date & time: 05/14/22  2637      History   Chief Complaint No chief complaint on file.   HPI Destiny Manning is a 52 y.o. female.   The history is provided by the patient.   Presents with a 1 week history of wheezing, shortness of breath, and chest tightness.  Patient states that she also has been coughing.  Cough has been productive.  Patient denies fever, chills, headache, ear pain, sore throat, chest pain, abdominal pain, nausea, vomiting, or diarrhea.  Patient reports she has been using her inhaler more frequently with minimal relief.  Patient denies any recent exacerbation of her asthma.  She states that she recently started a new inhaler, and was waiting to see if this help with her symptoms.  Patient also has a history of seasonal allergies.  Past Medical History:  Diagnosis Date   Asthma dx 06/04/07   ventolin, singulair   Blood transfusion without reported diagnosis    CHF (congestive heart failure) ([place]) 03/19/2008   a. 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 normalized by repeat imaging and at 55-60% in 04/2019,   DCM (dilated cardiomyopathy) ([place]) dx 06/06/07   EF 10-15%   Degeneration of spine    DJD (degenerative joint disease), lumbar 11/16/2016   chronic lumbar pain   GERD (gastroesophageal reflux disease)    takes Nexium Daily   Goiter 09/09/2020   Hyperlipidemia, unspecified 09/21/2021   Hypertension    Sciatica 09/21/2021   Seasonal allergies    takes zyrtec    Patient Active Problem List   Diagnosis Date Noted   Hair loss 05/11/2022   History of cardiomyopathy 04/07/2022   Fibromuscular dysplasia ([place]) 04/07/2022   Atherosclerosis of aorta ([place]) 04/07/2022   S/P hysterectomy with oophorectomy 01/04/2022   DDD (degenerative disc disease), cervical 09/21/2021   Hyperlipidemia, unspecified 09/21/2021   Migraine 09/21/2021   Chronic right shoulder pain 05/19/2021   Multinodular goiter  09/23/2020   Hypertension 11/16/2016   Asthma in adult, mild persistent, uncomplicated 85/88/5027   Chronic lower back pain 11/16/2016   Gastroesophageal reflux disease 08/13/2012    Past Surgical History:  Procedure Laterality Date   ABDOMINAL HYSTERECTOMY     fibroids; partial- ovaries intact   BIOPSY  02/17/2021   Procedure: BIOPSY;  Surgeon: [name], MD;  [facility];  Service: Endoscopy;;   CERVICAL SPINE SURGERY  2002   [facility], ruptured disc   CHOLECYSTECTOMY  2001   laparoscopic, [facility]   COLONOSCOPY WITH PROPOFOL N/A 07/12/2017   tubular adenoma (10 mm polyp in cecum), segmental biopsies negative. 5 year surveillance   ESOPHAGOGASTRODUODENOSCOPY (EGD) WITH ESOPHAGEAL DILATION N/A 09/05/2012   XAJ:OINOM hiatal hernia s/p esophageal dilation with 3 F [place].    ESOPHAGOGASTRODUODENOSCOPY (EGD) WITH PROPOFOL N/A 07/12/2017   normal esophagus s/p dilation, normal duodenal bulb and second portion of duodenum   ESOPHAGOGASTRODUODENOSCOPY (EGD) WITH PROPOFOL N/A 02/17/2021   Surgeon: [name], MD;   Normal esophagus s/p dilation, normal stomach and examined duodenum.   LAPAROSCOPIC BILATERAL SALPINGO OOPHERECTOMY Bilateral 12/15/2020   Procedure: LAPAROSCOPIC BILATERAL SALPINGO OOPHORECTOMY;  Surgeon: [name], MD;  Location: [facility];  Service: Gynecology;  Laterality: Bilateral;   MALONEY DILATION N/A 07/12/2017   Procedure: [name] DILATION;  Surgeon: [name], MD;  [facility];  Service: Endoscopy;  Laterality: N/A;   MALONEY DILATION N/A 02/17/2021   Procedure: [name]  DILATION;  Surgeon: [name], MD;  [facility];  Service: Endoscopy;  Laterality: N/A;   POLYPECTOMY  07/12/2017   Procedure: POLYPECTOMY;  Surgeon: [name], MD;  [facility];  Service: Endoscopy;;  Cecal polyp (HS)   SPINE SURGERY     TUBAL LIGATION     VAGINAL HYSTERECTOMY  10/19/2010   Procedure: HYSTERECTOMY VAGINAL;   Surgeon: [name], MD;  Location: [facility];  Service: Gynecology;  Laterality: N/A;    OB History     Gravida  3   Para  2   Term  2   Preterm      AB  1   Living  2      SAB  1   IAB      Ectopic      Multiple      Live Births  2            Home Medications    Prior to Admission medications   Medication Sig Start Date End Date Taking? Authorizing Provider  albuterol (PROVENTIL) (2.5 MG/3ML) 0.083% nebulizer solution Take 3 mLs (2.5 mg total) by nebulization every 6 (six) hours as needed for wheezing or shortness of breath. 05/14/22  Yes Cadel Stairs-Warren, Alda Lea, NP  amoxicillin-clavulanate (AUGMENTIN) 875-125 MG tablet Take 1 tablet by mouth every 12 (twelve) hours. 05/14/22  Yes Makenize Messman-Warren, Alda Lea, NP  predniSONE (DELTASONE) 50 MG tablet Take 1 tablet daily with breakfast for the next 5 days. 05/14/22  Yes Tung Pustejovsky-Warren, Alda Lea, NP  atorvastatin (LIPITOR) 10 MG tablet Take 1 tablet (10 mg total) by mouth daily. 01/07/22   [name], [name], FNP  baclofen (LIORESAL) 10 MG tablet Take 1 tablet (10 mg total) by mouth 3 (three) times daily as needed for muscle spasms. 04/07/22   Coral Spikes, DO  carvedilol (COREG) 25 MG tablet TAKE ONE TABLET BY MOUTH 2 TIMES A DAY 03/06/22   Fay Records, MD  cetirizine (ZYRTEC) 10 MG tablet Take 1 tablet (10 mg total) by mouth daily. 08/09/21   [name], [name], FNP  estradiol (ESTRACE) 2 MG tablet TAKE 1 TABLET BY MOUTH ONCE A DAY. 01/04/22   [name], MD  fluticasone (FLONASE) 50 MCG/ACT nasal spray Place 1 spray into both nostrils daily. 04/07/22   Coral Spikes, DO  fluticasone-salmeterol (ADVAIR DISKUS) 500-50 MCG/ACT AEPB Inhale 1 puff into the lungs in the morning and at bedtime. 04/07/22   Coral Spikes, DO  gabapentin (NEURONTIN) 800 MG tablet Take 1 tablet (800 mg total) by mouth 3 (three) times daily. 04/07/22   Coral Spikes, DO  losartan (COZAAR) 100 MG tablet Take 1 tablet (100 mg total) by mouth daily.  04/07/22   Cook, Barnie Del, DO  lubiprostone (AMITIZA) 8 MCG capsule TAKE (1) CAPSULE BY MOUTH TWICE DAILY. 02/02/22   Erenest Rasher, PA-C  Minoxidil (MINOXIDIL FOR WOMEN) 5 % FOAM Apply 1/2 capful to scalp/hair once daily. 05/11/22   Coral Spikes, DO  montelukast (SINGULAIR) 10 MG tablet Take 1 tablet (10 mg total) by mouth daily. 04/07/22   Coral Spikes, DO  ondansetron (ZOFRAN) 4 MG tablet Take 1 tablet (4 mg total) by mouth every 8 (eight) hours as needed for nausea or vomiting. 08/29/21   Erenest Rasher, PA-C  pantoprazole (PROTONIX) 40 MG tablet TAKE 1 TABLET BY MOUTH TWICE DAILY BEFORE MEALS. 04/05/22   Carlan, Chelsea L, NP  potassium chloride (KLOR-CON  M) 10 MEQ tablet Take 2 tablets (20 mEq total) by mouth 2 (two) times daily for 3 days. 04/09/22 04/12/22  Coral Spikes, DO  SUMAtriptan (IMITREX) 50 MG tablet Take 1 tablet (50 mg total) by mouth 2 (two) times daily as needed for migraine. May repeat in 2 hours if headache persists or recurs. 04/07/22   Coral Spikes, DO    Family History Family History  Problem Relation Age of Onset   Diabetes Mother    Hypertension Mother    Congestive Heart Failure Father 53   Arthritis Father    Dementia Maternal Grandmother    Congestive Heart Failure Paternal Grandmother    Congestive Heart Failure Paternal Grandfather    Cancer Maternal Uncle    Anesthesia problems Neg Hx    Hypotension Neg Hx    Pseudochol deficiency Neg Hx    Malignant hyperthermia Neg Hx    Colon cancer Neg Hx    Colon polyps Neg Hx     Social History Social History   Tobacco Use   Smoking status: Former    Packs/day: 0.25    Years: 4.00    Total pack years: 1.00    Types: Cigarettes    Quit date: 03/14/2009    Years since quitting: 13.1   Smokeless tobacco: Never  Vaping Use   Vaping Use: Never used  Substance Use Topics   Alcohol use: No   Drug use: No     Allergies   Patient has no known allergies.   Review of Systems Review of Systems Per  HPI  Physical Exam Triage Vital Signs ED Triage Vitals  Enc Vitals Group     BP 05/14/22 1018 123/78     Pulse Rate 05/14/22 1018 92     Resp 05/14/22 1018 18     Temp 05/14/22 1018 98.7 F (37.1 C)     Temp Source 05/14/22 1018 Oral     SpO2 05/14/22 1018 94 %     Weight --      Height --      Head Circumference --      Peak Flow --      Pain Score 05/14/22 1022 7     Pain Loc --      Pain Edu? --      Excl. in [place]? --    No data found.  Updated Vital Signs BP 123/78 (BP Location: Right Arm)   Pulse 92   Temp 98.7 F (37.1 C) (Oral)   Resp 18   LMP 07/04/2010   SpO2 94%   Visual Acuity Right Eye Distance:   Left Eye Distance:   Bilateral Distance:    Right Eye Near:   Left Eye Near:    Bilateral Near:     Physical Exam Vitals and nursing note reviewed.  Constitutional:      General: She is not in acute distress.    Appearance: She is well-developed.  HENT:     Head: Normocephalic.     Right Ear: Tympanic membrane, ear canal and external ear normal.     Left Ear: Tympanic membrane, ear canal and external ear normal.     Nose: Nose normal.     Mouth/Throat:     Mouth: Mucous membranes are moist.     Pharynx: Posterior oropharyngeal erythema present. No oropharyngeal exudate.  Eyes:     Extraocular Movements: Extraocular movements intact.     Pupils: Pupils are equal, round, and reactive to light.  Cardiovascular:  Rate and Rhythm: Normal rate and regular rhythm.     Pulses: Normal pulses.     Heart sounds: Normal heart sounds.  Pulmonary:     Effort: Pulmonary effort is normal.     Breath sounds: Normal breath sounds.     Comments: Respiratory effort is normal, patient with moderate coughing with deep breathing. Abdominal:     General: Bowel sounds are normal. There is no distension.     Palpations: Abdomen is soft.     Tenderness: There is no abdominal tenderness. There is no guarding or rebound.  Genitourinary:    Vagina: Normal. No vaginal  discharge.  Musculoskeletal:     Cervical back: Normal range of motion.  Lymphadenopathy:     Cervical: No cervical adenopathy.  Skin:    General: Skin is warm and dry.     Findings: No erythema or rash.  Neurological:     General: No focal deficit present.     Mental Status: She is alert and oriented to person, place, and time.     Cranial Nerves: No cranial nerve deficit.  Psychiatric:        Mood and Affect: Mood normal.        Behavior: Behavior normal.      UC Treatments / Results  Labs (all labs ordered are listed, but only abnormal results are displayed) Labs Reviewed - No data to display  EKG   Radiology DG Chest 2 View  Result Date: 05/14/2022 CLINICAL DATA:  Wheezing, shortness of breath and asthma. EXAM: CHEST - 2 VIEW COMPARISON:  03/15/2011. FINDINGS: Heart size is normal. There is blunting of the costophrenic angles identified bilaterally. There is mild diffuse increase interstitial markings identified bilaterally. Subtle asymmetric opacity identified within the left upper lobe. Mild central airway thickening. IMPRESSION: 1. Increased interstitial markings and blunting of the costophrenic angles suspicious for small effusions and mild interstitial edema. Correlate for any clinical signs/symptoms of CHF. 2. Subtle asymmetric opacity within the left upper lobe which may represent early infiltrate versus asymmetric Electronically Signed   By: Kerby Moors M.D.   On: 05/14/2022 11:02    Procedures Procedures (including critical care time)  Medications Ordered in UC Medications  methylPREDNISolone sodium succinate (SOLU-MEDROL) 125 mg/2 mL injection 80 mg (80 mg Intramuscular Given 05/14/22 1054)    Initial Impression / Assessment and Plan / UC Course  I have reviewed the triage vital signs and the nursing notes.  Pertinent labs & imaging results that were available during my care of the patient were reviewed by me and considered in my medical decision making (see  chart for details).  The patient is well-appearing, she is in no acute distress, her vital signs are stable.  Chest x-ray concerning for possible CHF exacerbation versus infection.  Will start patient on Augmentin 875/125 for possible infection along with prednisone 50 mg to help with any possibility of eczema exacerbation and albuterol nebulizer solution for wheezing.  Cannot rule out asthma exacerbation, as this may also be worsening the patient's symptoms.  Patient was given strict indications of when follow-up may be necessary, along with indications as to why her symptoms may be present.  Patient advised to go to the emergency department for worsening cough, shortness of breath, difficulty breathing for possible worsening effusions as seen on her chest x-ray.  Would like for the patient to follow-up with her primary care physician this week for further evaluation.  Patient verbalizes understanding.  All questions were answered.  Patient stable  for discharge.  Final Clinical Impressions(s) / UC Diagnoses   Final diagnoses:  Cough, unspecified type  Acute on chronic congestive heart failure, unspecified heart failure type ([place])  History of asthma     Discharge Instructions      Your chest x-ray shows a possible exacerbation of your CHF or a possible infection.  Your cough could be caused by fluid in your lungs.  I am concerned that if your cough worsens, you could be developing more fluid.  If your cough does not improve, please go to the emergency department immediately for further evaluation. Take medication as prescribed. Increase fluids and allow for plenty of rest. Recommend sleeping elevated on pillows while your symptoms persist. If you develop worsening shortness of breath, difficulty breathing, or other concerns, please go to the emergency department. Please follow-up with your primary care physician within this week for reevaluation. Follow-up as needed.      ED  Prescriptions     Medication Sig Dispense Auth. Provider   albuterol (PROVENTIL) (2.5 MG/3ML) 0.083% nebulizer solution Take 3 mLs (2.5 mg total) by nebulization every 6 (six) hours as needed for wheezing or shortness of breath. 75 mL Danni Leabo-Warren, Alda Lea, NP   predniSONE (DELTASONE) 50 MG tablet Take 1 tablet daily with breakfast for the next 5 days. 5 tablet Desmon Hitchner-Warren, Alda Lea, NP   amoxicillin-clavulanate (AUGMENTIN) 875-125 MG tablet Take 1 tablet by mouth every 12 (twelve) hours. 14 tablet Cheskel Silverio-Warren, Alda Lea, NP      PDMP not reviewed this encounter.   Tish Men, NP 05/14/22 1121

## 2022-05-14 NOTE — Discharge Instructions (Addendum)
Your chest x-ray shows a possible exacerbation of your CHF or a possible infection.  Your cough could be caused by fluid in your lungs.  I am concerned that if your cough worsens, you could be developing more fluid.  If your cough does not improve, please go to the emergency department immediately for further evaluation. Take medication as prescribed. Increase fluids and allow for plenty of rest. Recommend sleeping elevated on pillows while your symptoms persist. If you develop worsening shortness of breath, difficulty breathing, or other concerns, please go to the emergency department. Please follow-up with your primary care physician within this week for reevaluation. Follow-up as needed.

## 2022-05-22 NOTE — Progress Notes (Unsigned)
Cardiology Office Note:    Date:  05/23/2022   ID:  Destiny Manning, DOB 01-02-1971, MRN [id number]  PCP:  Destiny Spikes, DO  [facility] Cardiologist:  Dorris Carnes, MD     Referring MD: Destiny Spikes, DO   Chief Complaint:  Shortness of Breath     History of Present Illness:   Destiny Manning is a 52 y.o. 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   Patient saw Ms. Strader, PA-C 07/2020 and CTA of neck ordered b/c of tortuosity of carotids. 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 [name] then recommended MRA abd/pelvis-09/2020 consistent with FMD and MRA brain ordered but not done. [name] recommended following.    I saw the patient 08/2021 and she was doing well.   In ED 05/14/22 with wheezing, dyspnea and exacerbation of asthma. CXR ? Mild interstitial edema and subtle asymmetric opacity in LUL ? Infiltrate. Started on augmentin and prednisone and albuterol nebulizer.  Patient  comes  in for f/u. She was short of breath and started her advair inhaler and it didn't help. She is still having some tightness in her chest and rattling with thick phlegm. Worse when she lays down. Feels like she has some ankle swelling since on prednisone.  Short of breath even sitting. Increased fatigue. Yest her left ear and face started hurting. PCP stopped chlorthalidone 12.5 mg daily last month b/c of low K. She's wondering if this started it all.      Past Medical History:  Diagnosis Date   Asthma dx 06/04/07   ventolin, singulair   Blood transfusion without reported diagnosis    CHF (congestive heart failure) ([place]) 03/19/2008   a. 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 normalized by repeat imaging and at 55-60% in 04/2019,   DCM (dilated cardiomyopathy) ([place]) dx  06/06/07   EF 10-15%   Degeneration of spine    DJD (degenerative joint disease), lumbar 11/16/2016   chronic lumbar pain   GERD (gastroesophageal reflux disease)    takes Nexium Daily   Goiter 09/09/2020   Hyperlipidemia, unspecified 09/21/2021   Hypertension    Sciatica 09/21/2021   Seasonal allergies    takes zyrtec   Current Medications: Current Meds  Medication Sig   albuterol (PROVENTIL) (2.5 MG/3ML) 0.083% nebulizer solution Take 3 mLs (2.5 mg total) by nebulization every 6 (six) hours as needed for wheezing or shortness of breath.   amoxicillin-clavulanate (AUGMENTIN) 875-125 MG tablet Take 1 tablet by mouth every 12 (twelve) hours.   atorvastatin (LIPITOR) 10 MG tablet Take 1 tablet (10 mg total) by mouth daily.   azithromycin (ZITHROMAX Z-PAK) 250 MG tablet Take 500 mg today, then 250 mg daily for the next 4 days   baclofen (LIORESAL) 10 MG tablet Take 1 tablet (10 mg total) by mouth 3 (three) times daily as needed for muscle spasms.   carvedilol (COREG) 25 MG tablet TAKE ONE TABLET BY MOUTH 2 TIMES A DAY   cetirizine (ZYRTEC) 10 MG tablet Take 1 tablet (10 mg total) by mouth daily.   chlorthalidone (HYGROTON) 25 MG tablet Take 0.5 tablets (12.5 mg total) by mouth daily.   estradiol (ESTRACE) 2 MG tablet TAKE 1 TABLET BY MOUTH ONCE A DAY.   fluticasone (FLONASE) 50 MCG/ACT nasal spray Place 1 spray  into both nostrils daily.   fluticasone-salmeterol (ADVAIR DISKUS) 500-50 MCG/ACT AEPB Inhale 1 puff into the lungs in the morning and at bedtime.   gabapentin (NEURONTIN) 800 MG tablet Take 1 tablet (800 mg total) by mouth 3 (three) times daily.   losartan (COZAAR) 100 MG tablet Take 1 tablet (100 mg total) by mouth daily.   lubiprostone (AMITIZA) 8 MCG capsule TAKE (1) CAPSULE BY MOUTH TWICE DAILY.   Minoxidil (MINOXIDIL FOR WOMEN) 5 % FOAM Apply 1/2 capful to scalp/hair once daily.   montelukast (SINGULAIR) 10 MG tablet Take 1 tablet (10 mg total) by mouth daily.   ondansetron  (ZOFRAN) 4 MG tablet Take 1 tablet (4 mg total) by mouth every 8 (eight) hours as needed for nausea or vomiting.   pantoprazole (PROTONIX) 40 MG tablet TAKE 1 TABLET BY MOUTH TWICE DAILY BEFORE MEALS.   potassium chloride SA (KLOR-CON M) 20 MEQ tablet Take 1 tablet (20 mEq total) by mouth daily.   predniSONE (DELTASONE) 50 MG tablet Take 1 tablet daily with breakfast for the next 5 days.   SUMAtriptan (IMITREX) 50 MG tablet Take 1 tablet (50 mg total) by mouth 2 (two) times daily as needed for migraine. May repeat in 2 hours if headache persists or recurs.    Allergies:   Patient has no known allergies.   Social History   Tobacco Use   Smoking status: Former    Packs/day: 0.25    Years: 4.00    Total pack years: 1.00    Types: Cigarettes    Quit date: 03/14/2009    Years since quitting: 13.2   Smokeless tobacco: Never  Vaping Use   Vaping Use: Never used  Substance Use Topics   Alcohol use: No   Drug use: No    Family Hx: The patient's family history includes Arthritis in her father; Cancer in her maternal uncle; Congestive Heart Failure in her paternal grandfather and paternal grandmother; Congestive Heart Failure (age of onset: 57) in her father; Dementia in her maternal grandmother; Diabetes in her mother; Hypertension in her mother. There is no history of Anesthesia problems, Hypotension, Pseudochol deficiency, Malignant hyperthermia, Colon cancer, or Colon polyps.  ROS     Physical Exam:    VS:  BP 126/68   Pulse 84   Ht 5' 3"$  (1.6 m)   Wt 155 lb (70.3 kg)   LMP 07/04/2010   SpO2 97%   BMI 27.46 kg/m     Wt Readings from Last 3 Encounters:  05/23/22 155 lb (70.3 kg)  05/11/22 148 lb 3.2 oz (67.2 kg)  04/07/22 145 lb (65.8 kg)    Physical Exam  GEN: Well nourished, well developed, in no acute distress  HEENT: left ear painful, opaque and infected Neck: no JVD, carotid bruits, or masses Cardiac:RRR; no murmurs, rubs, or gallops  Respiratory: decreased breath  sounds but  clear to auscultation bilaterally, normal work of breathing GI: soft, nontender, nondistended, + BS Ext: trace edema otherwise without cyanosis, clubbing, Good distal pulses bilaterally  Neuro:  Alert and Oriented x 3, Strength and sensation are intact Psych: euthymic mood, full affect        EKGs/Labs/Other Test Reviewed:    EKG:  EKG is   ordered today.  The ekg ordered today demonstrates NSR, nonspecific ST changes, no acute change  Recent Labs: 11/02/2021: TSH 0.480 04/07/2022: ALT 6; BUN 11; Creatinine, Ser 0.96; Hemoglobin 15.5; Platelets 311; Potassium 3.3; Sodium 141   Recent Lipid Panel Recent Labs  04/07/22 1101  CHOL 163  TRIG 162*  HDL 54  LDLCALC 81     Prior CV Studies:   Carotids 09/19/21 IMPRESSION: Carotid duplex compatible with cerebrovascular FMD, worst on the right side. No significant atherosclerotic changes. Confirmatory testing might include CT angiogram neck, formal cervical-cerebral angiogram, or MR angiogram.   Signed,   Dulcy Fanny. Nadene Rubins, RPVI   [facility]   [facility]     Electronically Signed   By: Corrie Mckusick D.O.   On: 09/19/2021 13:12   MRI 09/2021 IMPRESSION: No emergent large vessel occlusion or proximal hemodynamically significant stenosis.    MRA abd/pelvis 09/2020 IMPRESSION: 1. 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 2. No evidence of aneurysm or dissection. 3. Scattered atherosclerotic plaque in the aorta, iliac and femoral arteries. Aortic Atherosclerosis (ICD10-I70.0). 4. Small penetrating atherosclerotic ulcer in the right common iliac artery. 5. No acute abnormality within the abdomen or pelvis.   Signed,   Criselda Peaches, MD, [place]   [facility]   [facility]      Electronically Signed   By: Jacqulynn Cadet M.D.   On: 10/05/2020 09:42   MRA neck 09/2020 IMPRESSION: Findings consistent with fibromuscular dysplasia of the internal carotid artery bilaterally, left greater than right. There is mild fusiform dilatation of the internal carotid artery below the skull base bilaterally which may be related to fibromuscular dysplasia. No dissection identified. There is probable mild fibromuscular dysplasia left vertebral artery.   Thyroid goiter. 14 mm right thyroid nodule and 12 mm left thyroid nodule. No further imaging necessary. (Ref: J Am Coll Radiol. 2015 Feb;12(2): 143-50).     Electronically Signed   By: Franchot Gallo M.D.   On: 08/31/2020 12:00  NST: 10/2017   Carotid Dopplers: 06/2020 IMPRESSION: 1. Tortuosity is noted about the distal internal carotid arteries bilaterally with associated elevated flow velocities. No sonographic evidence of significant flow limiting stenosis or atherosclerotic plaque formation. These findings could be seen with fibromuscular dysplasia. 2. 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   Consider CTA neck for further morphologic characterization of the internal carotid arteries.   Echocardiogram: 06/2020 IMPRESSIONS     1. Left ventricular ejection fraction, by estimation, is 60 to 65%. The  left ventricle has normal function. The left ventricle has no regional  wall motion abnormalities. There is moderate left ventricular hypertrophy.  Left ventricular diastolic  parameters are indeterminate.   2. Right ventricular systolic function is normal. The right ventricular  size is normal. Tricuspid regurgitation signal is inadequate for assessing  PA pressure.   3. Left atrial size was upper normal.   4. There is a trivial pericardial effusion posterior to the left  ventricle.   5. The mitral valve is abnormal, mildly thickened  with somewhat  restricted motion. Mild to moderate mitral valve regurgitation.   6. The aortic valve is tricuspid. Aortic valve regurgitation is mild to  moderate. Aortic regurgitation PHT measures 339 msec. Aortic valve mean  gradient measures 9.0 mmHg.   7. The inferior vena cava is normal in size with greater than 50%  respiratory variability, suggesting right atrial pressure of 3 mmHg.      Risk Assessment/Calculations/Metrics:              ASSESSMENT & PLAN:  No problem-specific Assessment & Plan notes found for this encounter.   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 Chlorthalidone stopped a month ago b/c of low K. Will restart with Kdur. Check bmet and BNP today. Repeat echo. Early f/u.  Otitis media infection left ear-Z-pack   Fibromuscular dysplasia carotids, renals, iliac. Good BP control.   MRA of head  6/122/23 no large vessel occlusion or stenosis and repeat carotid US 09/2021 consistent with FMD   Atherosclerotic plaque in aorta, iliac and femoral arteries on MRA 09/2020 no symptoms   HTN BP well controlled   HLD LDL 98 06/2021 on atorvastatin   MR/AI mild to mod on echo 06/2020.Repeat now              Dispo:  No follow-ups on file.   Medication Adjustments/Labs and Tests Ordered: Current medicines are reviewed at length with the patient today.  Concerns regarding medicines are outlined above.  Tests Ordered: Orders Placed This Encounter  Procedures   B Nat Peptide   EKG 12-Lead   ECHOCARDIOGRAM COMPLETE   Medication Changes: Meds ordered this encounter  Medications   azithromycin (ZITHROMAX Z-PAK) 250 MG tablet    Sig: Take 500 mg today, then 250 mg daily for the next 4 days    Dispense:  6 each    Refill:  0   chlorthalidone (HYGROTON) 25 MG tablet    Sig: Take 0.5 tablets (12.5  mg total) by mouth daily.    Dispense:  45 tablet    Refill:  3   potassium chloride SA (KLOR-CON M) 20 MEQ tablet    Sig: Take 1 tablet (20 mEq total) by mouth daily.    Dispense:  90 tablet    Refill:  3   Signed, Ermalinda Barrios, PA-C  05/23/2022 1:41 PM    [address], [place], [place]  [address] Phone: [phone]; Fax: [phone]

## 2022-05-23 ENCOUNTER — Ambulatory Visit: Payer: Medicaid Other | Admitting: Physician Assistant

## 2022-05-23 ENCOUNTER — Other Ambulatory Visit
Admission: RE | Admit: 2022-05-23 | Discharge: 2022-05-23 | Disposition: A | Discharge status: Home / Self Care | Payer: Medicaid Other | Source: Ambulatory Visit | Attending: Physician Assistant | Admitting: Physician Assistant

## 2022-05-23 ENCOUNTER — Encounter: Payer: Self-pay | Admitting: Physician Assistant

## 2022-05-23 VITALS — BP 126/68 | HR 84 | Ht 63.0 in | Wt 155.0 lb

## 2022-05-23 DIAGNOSIS — I340 Nonrheumatic mitral (valve) insufficiency: Secondary | ICD-10-CM

## 2022-05-23 DIAGNOSIS — E7849 Other hyperlipidemia: Secondary | ICD-10-CM

## 2022-05-23 DIAGNOSIS — I10 Essential (primary) hypertension: Secondary | ICD-10-CM | POA: Diagnosis not present

## 2022-05-23 DIAGNOSIS — R0602 Shortness of breath: Secondary | ICD-10-CM

## 2022-05-23 DIAGNOSIS — I773 Arterial fibromuscular dysplasia: Secondary | ICD-10-CM

## 2022-05-23 DIAGNOSIS — H6500 Acute serous otitis media, unspecified ear: Secondary | ICD-10-CM

## 2022-05-23 DIAGNOSIS — I428 Other cardiomyopathies: Secondary | ICD-10-CM | POA: Insufficient documentation

## 2022-05-23 LAB — BASIC METABOLIC PANEL
Anion gap: 10 (ref 5–15)
BUN: 9 mg/dL (ref 6–20)
CO2: 26 mmol/L (ref 22–32)
Calcium: 8 mg/dL — ABNORMAL LOW (ref 8.9–10.3)
Chloride: 102 mmol/L (ref 98–111)
Creatinine, Ser: 0.95 mg/dL (ref 0.44–1.00)
GFR, Estimated: >60 mL/min (ref >60)
Glucose, Bld: 114 mg/dL — ABNORMAL HIGH (ref 70–99)
Potassium: 3 mmol/L — ABNORMAL LOW (ref 3.5–5.1)
Sodium: 138 mmol/L (ref 135–145)

## 2022-05-23 LAB — BRAIN NATRIURETIC PEPTIDE: B Natriuretic Peptide: 208 pg/mL — ABNORMAL HIGH (ref 0.0–100.0)

## 2022-05-23 MED ORDER — AZITHROMYCIN 250 MG PO TABS: ORAL_TABLET | ORAL | 0 refills | Status: DC

## 2022-05-23 MED ORDER — POTASSIUM CHLORIDE CRYS ER 20 MEQ PO TBCR: 20.0000 meq | EXTENDED_RELEASE_TABLET | Freq: Every day | ORAL | 3 refills | Status: AC

## 2022-05-23 MED ORDER — CHLORTHALIDONE 25 MG PO TABS: 12.5000 mg | ORAL_TABLET | Freq: Every day | ORAL | 3 refills | Status: DC

## 2022-05-23 NOTE — Addendum Note (Signed)
Addended by: Barbarann Ehlers A on: 05/23/2022 03:36 PM   Modules accepted: Orders

## 2022-05-23 NOTE — Patient Instructions (Signed)
Medication Instructions:  Take Z-pack as directed   START Chlorthalidone  12.5 mg daily   Take Potassium 20 meq daily   Labwork: BNP,BMET today  Testing/Procedures: Your physician has requested that you have an echocardiogram. Echocardiography is a painless test that uses sound waves to create images of your heart. It provides your doctor with information about the size and shape of your heart and how well your heart's chambers and valves are working. This procedure takes approximately one hour. There are no restrictions for this procedure. Please do NOT wear cologne, perfume, aftershave, or lotions (deodorant is allowed). Please arrive 15 minutes prior to your appointment time.   Follow-Up: 2 weeks  Any Other Special Instructions Will Be Listed Below (If Applicable).  If you need a refill on your cardiac medications before your next appointment, please call your pharmacy.

## 2022-05-23 NOTE — Progress Notes (Signed)
Cardiology Office Note:    Date:  05/29/2022   ID:  Destiny Manning, DOB 1970/09/07, MRN [id number]  PCP:  Coral Spikes, DO  [facility] Cardiologist:  Dorris Carnes, MD     Referring MD: Coral Spikes, DO   Chief Complaint:  Follow-up     History of Present Illness:   Destiny Manning is a 52 y.o. 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   Patient saw Ms. Strader, PA-C 07/2020 and CTA of neck ordered b/c of tortuosity of carotids. 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 [name] then recommended MRA abd/pelvis-09/2020 consistent with FMD and MRA brain ordered but not done. [name] recommended following.     I saw the patient 08/2021 and she was doing well.    In ED 05/14/22 with wheezing, dyspnea and exacerbation of asthma. CXR ? Mild interstitial edema and subtle asymmetric opacity in LUL ? Infiltrate. Started on augmentin and prednisone and albuterol nebulizer.     At follow-up 05/23/2022 she continued to be SOB despite the above treatment.  It is worse when she lays down.  She has also noticed some ankle swelling since her prednisone.  She says it similar to when she had a cardiomyopathy.  PCP stopped her chlorthalidone 12.5 mg daily last month because of low potassium.  I ordered BNP 208 K 3.0 bmet and 2D echo-LVEF 60-65% mod LVH and small pericardial effusion.  Patient comes in for f/u. Still has some rattling in her chest but breathing better and edema improved. BP low.       Past Medical History:  Diagnosis Date   Asthma dx 06/04/07   ventolin, singulair   Blood transfusion without reported diagnosis    CHF (congestive heart failure) ([facility]) 03/19/2008   a. 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 normalized by repeat imaging and at 55-60% in 04/2019,    DCM (dilated cardiomyopathy) ([facility]) dx 06/06/07   EF 10-15%   Degeneration of spine    DJD (degenerative joint disease), lumbar 11/16/2016   chronic lumbar pain   GERD (gastroesophageal reflux disease)    takes Nexium Daily   Goiter 09/09/2020   Hyperlipidemia, unspecified 09/21/2021   Hypertension    Sciatica 09/21/2021   Seasonal allergies    takes zyrtec   Current Medications: Current Meds  Medication Sig   albuterol (PROVENTIL) (2.5 MG/3ML) 0.083% nebulizer solution Take 3 mLs (2.5 mg total) by nebulization every 6 (six) hours as needed for wheezing or shortness of breath.   atorvastatin (LIPITOR) 10 MG tablet Take 1 tablet (10 mg total) by mouth daily.   baclofen (LIORESAL) 10 MG tablet Take 1 tablet (10 mg total) by mouth 3 (three) times daily as needed for muscle spasms.   carvedilol (COREG) 25 MG tablet TAKE ONE TABLET BY MOUTH 2 TIMES A DAY   cetirizine (ZYRTEC) 10 MG tablet Take 1 tablet (10 mg total) by mouth daily.   chlorthalidone (HYGROTON) 25 MG tablet Take 0.5 tablets (12.5 mg total) by mouth daily.   estradiol (ESTRACE) 2 MG tablet TAKE 1 TABLET BY MOUTH ONCE A DAY.   fluticasone (FLONASE) 50 MCG/ACT nasal spray Place 1 spray into both nostrils daily.   fluticasone-salmeterol (ADVAIR DISKUS) 500-50 MCG/ACT AEPB Inhale 1 puff into the lungs in the morning and  at bedtime.   gabapentin (NEURONTIN) 800 MG tablet Take 1 tablet (800 mg total) by mouth 3 (three) times daily.   losartan (COZAAR) 50 MG tablet Take 1 tablet (50 mg total) by mouth daily.   lubiprostone (AMITIZA) 8 MCG capsule TAKE (1) CAPSULE BY MOUTH TWICE DAILY.   Minoxidil (MINOXIDIL FOR WOMEN) 5 % FOAM Apply 1/2 capful to scalp/hair once daily.   montelukast (SINGULAIR) 10 MG tablet Take 1 tablet (10 mg total) by mouth daily.   ondansetron (ZOFRAN) 4 MG tablet Take 1 tablet (4 mg total) by mouth every 8 (eight) hours as needed for nausea or vomiting.   pantoprazole (PROTONIX) 40 MG tablet TAKE 1 TABLET BY MOUTH  TWICE DAILY BEFORE MEALS.   potassium chloride SA (KLOR-CON M) 20 MEQ tablet Take 1 tablet (20 mEq total) by mouth daily.   SUMAtriptan (IMITREX) 50 MG tablet Take 1 tablet (50 mg total) by mouth 2 (two) times daily as needed for migraine. May repeat in 2 hours if headache persists or recurs.   [DISCONTINUED] losartan (COZAAR) 100 MG tablet Take 1 tablet (100 mg total) by mouth daily.    Allergies:   Patient has no known allergies.   Social History   Tobacco Use   Smoking status: Former    Packs/day: 0.25    Years: 4.00    Total pack years: 1.00    Types: Cigarettes    Quit date: 03/14/2009    Years since quitting: 13.2   Smokeless tobacco: Never  Vaping Use   Vaping Use: Never used  Substance Use Topics   Alcohol use: No   Drug use: No    Family Hx: The patient's family history includes Arthritis in her father; Cancer in her maternal uncle; Congestive Heart Failure in her paternal grandfather and paternal grandmother; Congestive Heart Failure (age of onset: 53) in her father; Dementia in her maternal grandmother; Diabetes in her mother; Hypertension in her mother. There is no history of Anesthesia problems, Hypotension, Pseudochol deficiency, Malignant hyperthermia, Colon cancer, or Colon polyps.  ROS     Physical Exam:    VS:  BP (!) 100/58   Pulse 88   Ht 5' 3"$  (1.6 m)   Wt 143 lb (64.9 kg)   LMP 07/04/2010   SpO2 97%   BMI 25.33 kg/m     Wt Readings from Last 3 Encounters:  05/29/22 143 lb (64.9 kg)  05/23/22 155 lb (70.3 kg)  05/11/22 148 lb 3.2 oz (67.2 kg)    Physical Exam  GEN: Well nourished, well developed, in no acute distress  Neck: no JVD, carotid bruits, or masses Cardiac:RRR; no murmurs, rubs, or gallops  Respiratory:  clear to auscultation bilaterally, normal work of breathing GI: soft, nontender, nondistended, + BS Ext: without cyanosis, clubbing, or edema, Good distal pulses bilaterally Neuro:  Alert and Oriented x 3,  Psych: euthymic mood, full  affect        EKGs/Labs/Other Test Reviewed:    EKG:  EKG is  not ordered today.     Recent Labs: 11/02/2021: TSH 0.480 04/07/2022: ALT 6; Hemoglobin 15.5; Platelets 311 05/23/2022: B Natriuretic Peptide 208.0; BUN 9; Creatinine, Ser 0.95; Potassium 3.0; Sodium 138   Recent Lipid Panel Recent Labs    04/07/22 1101  CHOL 163  TRIG 162*  HDL 54  LDLCALC 81     Prior CV Studies:    Echo: IMPRESSIONS     1. Left ventricular ejection fraction, by estimation, is 60 to 65%. The  left ventricle has normal function. The left ventricle has no regional  wall motion abnormalities. There is moderate concentric left ventricular  hypertrophy. Left ventricular  diastolic parameters are indeterminate.   2. Right ventricular systolic function is normal. The right ventricular  size is normal. There is normal pulmonary artery systolic pressure. The  estimated right ventricular systolic pressure is 0000000 mmHg.   3. Left atrial size was severely dilated.   4. A small pericardial effusion is present, moderate posterior  collection. The pericardial effusion is circumferential. Some undulation  of the right atrial free wall without compression. No clear tamponade  physiology.   5. The mitral valve is abnormal, mildly thickened with restricted leaflet  motion. Moderate mitral valve regurgitation. Mild mitral stenosis. MVA by  planimetry 1.4 cm2.   6. The aortic valve is tricuspid. Aortic valve regurgitation is moderate.  Aortic regurgitation PHT measures 371 msec. Aortic valve mean gradient  measures 10.0 mmHg.   Comparison(s): Prior images reviewed side by side. Pericardial effusion  has increased in size. Moderate LVH with valvular abnormalities as noted -  consider cardiac MRI to exclude infiltrative cardiomyopathy and further  evaluate valvular disease.    Carotids 09/19/21 IMPRESSION: Carotid duplex compatible with cerebrovascular FMD, worst on the right side. No significant  atherosclerotic changes. Confirmatory testing might include CT angiogram neck, formal cervical-cerebral angiogram, or MR angiogram.   Signed,   Dulcy Fanny. Nadene Rubins, RPVI   [facility]   [facility]     Electronically Signed   By: Corrie Mckusick D.O.   On: 09/19/2021 13:12   MRI 09/2021 IMPRESSION: No emergent large vessel occlusion or proximal hemodynamically significant stenosis.     MRA abd/pelvis 09/2020 IMPRESSION: 1. 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 2. No evidence of aneurysm or dissection. 3. Scattered atherosclerotic plaque in the aorta, iliac and femoral arteries. Aortic Atherosclerosis (ICD10-I70.0). 4. Small penetrating atherosclerotic ulcer in the right common iliac artery. 5. No acute abnormality within the abdomen or pelvis.   Signed,   Criselda Peaches, MD, [place]   [facility]   [facility]     Electronically Signed   By: Jacqulynn Cadet M.D.   On: 10/05/2020 09:42   MRA neck 09/2020 IMPRESSION: Findings consistent with fibromuscular dysplasia of the internal carotid artery bilaterally, left greater than right. There is mild fusiform dilatation of the internal carotid artery below the skull base bilaterally which may be related to fibromuscular dysplasia. No dissection identified. There is probable mild fibromuscular dysplasia left vertebral artery.   Thyroid goiter. 14 mm right thyroid nodule and 12 mm left thyroid nodule. No further imaging necessary. (Ref: J Am Coll Radiol. 2015 Feb;12(2): 143-50).     Electronically Signed   By: Franchot Gallo M.D.   On: 08/31/2020 12:00  NST: 10/2017   Carotid Dopplers: 06/2020 IMPRESSION: 1. Tortuosity is noted about the distal internal carotid arteries bilaterally with  associated elevated flow velocities. No sonographic evidence of significant flow limiting stenosis or atherosclerotic plaque formation. These findings could be seen with fibromuscular dysplasia. 2. 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   Consider CTA neck for further morphologic characterization of the internal carotid arteries.   Echocardiogram: 06/2020 IMPRESSIONS     1. Left ventricular ejection fraction, by estimation, is 60 to 65%.  The  left ventricle has normal function. The left ventricle has no regional  wall motion abnormalities. There is moderate left ventricular hypertrophy.  Left ventricular diastolic  parameters are indeterminate.   2. Right ventricular systolic function is normal. The right ventricular  size is normal. Tricuspid regurgitation signal is inadequate for assessing  PA pressure.   3. Left atrial size was upper normal.   4. There is a trivial pericardial effusion posterior to the left  ventricle.   5. The mitral valve is abnormal, mildly thickened with somewhat  restricted motion. Mild to moderate mitral valve regurgitation.   6. The aortic valve is tricuspid. Aortic valve regurgitation is mild to  moderate. Aortic regurgitation PHT measures 339 msec. Aortic valve mean  gradient measures 9.0 mmHg.   7. The inferior vena cava is normal in size with greater than 50%  respiratory variability, suggesting right atrial pressure of 3 mmHg.     Risk Assessment/Calculations/Metrics:              ASSESSMENT & PLAN:   No problem-specific Assessment & Plan notes found for this encounter.   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 Chlorthalidone stopped a month ago b/c of low K. BNP 208, K 3.0, Echo with normal LVEF, mod  LVH, small pericardial effusion.  Symptoms improved with chlorthalidone but still has some rattling in her chest and cough, BP running low.  Will reduce losartan to 50 mg once a day continue chlorthalidone for now recheck BNP and potassium as well as PA and lateral chest x-ray tomorrow and follow-up in 1 to 2 weeks.  She will contact pulmonary about her inhalers. Plan for repeat limited echo at f/u   Fibromuscular dysplasia carotids, renals, iliac. Good BP control.   MRA of head  6/122/23 no large vessel occlusion or stenosis and repeat carotid US 09/2021 consistent with FMD   Atherosclerotic plaque in aorta, iliac and femoral arteries on MRA 09/2020 no symptoms   HTN BP running low.  Decrease losartan to 50 mg once daily   HLD LDL 98 06/2021 on atorvastatin   MR/AI moderate MR and mild MS, mod AI on echo last week                Dispo:  No follow-ups on file.   Medication Adjustments/Labs and Tests Ordered: Current medicines are reviewed at length with the patient today.  Concerns regarding medicines are outlined above.  Tests Ordered: Orders Placed This Encounter  Procedures   DG Chest 2 View   B Nat Peptide   Basic metabolic panel   Medication Changes: Meds ordered this encounter  Medications   losartan (COZAAR) 50 MG tablet    Sig: Take 1 tablet (50 mg total) by mouth daily.    Dispense:  90 tablet    Refill:  3    05/29/22 dose reduced to 50 mg daily   Signed, Ermalinda Barrios, PA-C  05/29/2022 2:32 PM    [address] [place], [place], [place]  [address] Phone: [phone]; Fax: [phone]

## 2022-05-24 ENCOUNTER — Ambulatory Visit (HOSPITAL_COMMUNITY)
Admission: RE | Admit: 2022-05-24 | Discharge: 2022-05-24 | Disposition: A | Discharge status: Home / Self Care | Payer: Medicaid Other | Source: Ambulatory Visit | Attending: Physician Assistant | Admitting: Physician Assistant

## 2022-05-24 ENCOUNTER — Telehealth: Payer: Self-pay

## 2022-05-24 DIAGNOSIS — R0602 Shortness of breath: Secondary | ICD-10-CM

## 2022-05-24 LAB — ECHOCARDIOGRAM COMPLETE
AR max vel: 1.42 cm2
AV Area VTI: 1.4 cm2
AV Area mean vel: 1.26 cm2
AV Mean grad: 10 mmHg
AV Peak grad: 19.5 mmHg
Ao pk vel: 2.21 m/s
Area-P 1/2: 2.32 cm2
MV M vel: 5.49 m/s
MV Peak grad: 120.6 mmHg
MV VTI: 1.05 cm2
P 1/2 time: 371 msec
Radius: 0.5 cm
S' Lateral: 2.5 cm

## 2022-05-24 NOTE — Progress Notes (Signed)
*  PRELIMINARY RESULTS* Echocardiogram 2D Echocardiogram has been performed.  Destiny Manning 05/24/2022, 12:26 PM

## 2022-05-24 NOTE — Telephone Encounter (Signed)
Patient notified and verbalized understanding. Patient had no questions or concerns at this time.  

## 2022-05-24 NOTE — Telephone Encounter (Signed)
-----   Message from Imogene Burn, PA-C sent at 05/24/2022  8:07 AM EST ----- K low 3.0. Take Kdur 20 meq tid and I'll repeat it next week when I see her. Her heart failure marker is slightly elevated so make sure she takes the chlorthalidone as discussed.

## 2022-05-26 ENCOUNTER — Telehealth: Payer: Self-pay | Admitting: Internal Medicine

## 2022-05-26 ENCOUNTER — Telehealth: Payer: Self-pay | Admitting: Family Medicine

## 2022-05-26 ENCOUNTER — Other Ambulatory Visit: Payer: Self-pay | Admitting: Family Medicine

## 2022-05-26 MED ORDER — ALBUTEROL SULFATE (2.5 MG/3ML) 0.083% IN NEBU: 2.5000 mg | INHALATION_SOLUTION | Freq: Four times a day (QID) | RESPIRATORY_TRACT | 0 refills | Status: DC | PRN

## 2022-05-26 NOTE — Telephone Encounter (Signed)
Patient needing refill on neb solution for her machine went to Urgent Care 2/4 with wheezing and cough . She is still having a little cough and wheezing. [employer]

## 2022-05-26 NOTE — Telephone Encounter (Signed)
Spoke to patient and verbalized that she should call PCP Dr. Lacinda Axon for refill of prescription. Patient verbalized understanding.

## 2022-05-26 NOTE — Telephone Encounter (Signed)
Cook, Jayce G, DO   ? ?Rx sent.   ? ?

## 2022-05-26 NOTE — Telephone Encounter (Signed)
*  STAT* If patient is at the pharmacy, call can be transferred to refill team.   1. Which medications need to be refilled? (please list name of each medication and dose if known)   albuterol (PROVENTIL) (2.5 MG/3ML) 0.083% nebulizer solution   2. Which pharmacy/location (including street and city if local pharmacy) is medication to be sent to?  [place], [address]   3. Do they need a 30 day or 90 day supply? 90 day  Patient stated she is only has 2 vials of this medication left.  Patient would like call back to confirm she can pick up this medication today.

## 2022-05-26 NOTE — Telephone Encounter (Signed)
Patient notified

## 2022-05-29 ENCOUNTER — Ambulatory Visit: Payer: Medicaid Other | Attending: Physician Assistant | Admitting: Physician Assistant

## 2022-05-29 ENCOUNTER — Encounter: Payer: Self-pay | Admitting: Family Medicine

## 2022-05-29 ENCOUNTER — Encounter: Payer: Self-pay | Admitting: Physician Assistant

## 2022-05-29 ENCOUNTER — Other Ambulatory Visit: Payer: Self-pay

## 2022-05-29 VITALS — BP 100/58 | HR 88 | Ht 63.0 in | Wt 143.0 lb

## 2022-05-29 DIAGNOSIS — I773 Arterial fibromuscular dysplasia: Secondary | ICD-10-CM

## 2022-05-29 DIAGNOSIS — I700 Atherosclerosis of aorta: Secondary | ICD-10-CM

## 2022-05-29 DIAGNOSIS — I340 Nonrheumatic mitral (valve) insufficiency: Secondary | ICD-10-CM

## 2022-05-29 DIAGNOSIS — I3139 Other pericardial effusion (noninflammatory): Secondary | ICD-10-CM | POA: Diagnosis not present

## 2022-05-29 DIAGNOSIS — Z8701 Personal history of pneumonia (recurrent): Secondary | ICD-10-CM

## 2022-05-29 DIAGNOSIS — I10 Essential (primary) hypertension: Secondary | ICD-10-CM

## 2022-05-29 DIAGNOSIS — I428 Other cardiomyopathies: Secondary | ICD-10-CM

## 2022-05-29 DIAGNOSIS — E785 Hyperlipidemia, unspecified: Secondary | ICD-10-CM

## 2022-05-29 MED ORDER — FLUTICASONE-SALMETEROL 500-50 MCG/ACT IN AEPB: 1.0000 | INHALATION_SPRAY | Freq: Two times a day (BID) | RESPIRATORY_TRACT | 6 refills | Status: DC

## 2022-05-29 MED ORDER — LOSARTAN POTASSIUM 50 MG PO TABS: 50.0000 mg | ORAL_TABLET | Freq: Every day | ORAL | 3 refills | Status: DC

## 2022-05-29 NOTE — Patient Instructions (Addendum)
Medication Instructions:  DECREASE Losartan to 50 mg daily  Labwork: BMET,BNP  Testing/Procedures: Chest X-ray  Follow-Up: March 4th  Any Other Special Instructions Will Be Listed Below (If Applicable).  If you need a refill on your cardiac medications before your next appointment, please call your pharmacy.

## 2022-05-30 ENCOUNTER — Other Ambulatory Visit (HOSPITAL_COMMUNITY)
Admission: RE | Admit: 2022-05-30 | Discharge: 2022-05-30 | Disposition: A | Discharge status: Home / Self Care | Payer: Medicaid Other | Source: Ambulatory Visit | Attending: Physician Assistant | Admitting: Physician Assistant

## 2022-05-30 ENCOUNTER — Other Ambulatory Visit: Payer: Self-pay | Admitting: Internal Medicine

## 2022-05-30 ENCOUNTER — Ambulatory Visit (HOSPITAL_COMMUNITY)
Admission: RE | Admit: 2022-05-30 | Discharge: 2022-05-30 | Disposition: A | Discharge status: Home / Self Care | Payer: Medicaid Other | Source: Ambulatory Visit | Attending: Physician Assistant | Admitting: Physician Assistant

## 2022-05-30 DIAGNOSIS — I428 Other cardiomyopathies: Secondary | ICD-10-CM | POA: Diagnosis present

## 2022-05-30 DIAGNOSIS — I3139 Other pericardial effusion (noninflammatory): Secondary | ICD-10-CM

## 2022-05-30 DIAGNOSIS — Z8701 Personal history of pneumonia (recurrent): Secondary | ICD-10-CM | POA: Diagnosis present

## 2022-05-30 LAB — BASIC METABOLIC PANEL
Anion gap: 7 (ref 5–15)
BUN: 10 mg/dL (ref 6–20)
CO2: 25 mmol/L (ref 22–32)
Calcium: 8.5 mg/dL — ABNORMAL LOW (ref 8.9–10.3)
Chloride: 101 mmol/L (ref 98–111)
Creatinine, Ser: 1.01 mg/dL — ABNORMAL HIGH (ref 0.44–1.00)
GFR, Estimated: >60 mL/min (ref >60)
Glucose, Bld: 102 mg/dL — ABNORMAL HIGH (ref 70–99)
Potassium: 3.7 mmol/L (ref 3.5–5.1)
Sodium: 133 mmol/L — ABNORMAL LOW (ref 135–145)

## 2022-05-30 LAB — BRAIN NATRIURETIC PEPTIDE: B Natriuretic Peptide: 33 pg/mL (ref 0.0–100.0)

## 2022-05-30 NOTE — Progress Notes (Signed)
Cardiology Office Note:    Date:  06/12/2022   ID:  Destiny Manning, DOB 1970/08/13, MRN [id number]  PCP:  Coral Spikes, DO  [place] Providers Cardiologist:  Dorris Carnes, MD     Referring MD: Coral Spikes, DO   Chief Complaint:  No chief complaint on file.     History of Present Illness:   Destiny Manning is a 52 y.o. 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   Patient saw Ms. Strader, PA-C 07/2020 and CTA of neck ordered b/c of tortuosity of carotids. 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 [name] then recommended MRA abd/pelvis-09/2020 consistent with FMD and MRA brain ordered but not done. [name] recommended following.     I saw the patient 08/2021 and she was doing well.    In ED 05/14/22 with wheezing, dyspnea and exacerbation of asthma. CXR ? Mild interstitial edema and subtle asymmetric opacity in LUL ? Infiltrate. Started on augmentin and prednisone and albuterol nebulizer.      At follow-up 05/23/2022 she continued to be SOB despite the above treatment.  It is worse when she lays down.  She has also noticed some ankle swelling since her prednisone.  She says it similar to when she had a cardiomyopathy.  PCP stopped her chlorthalidone 12.5 mg daily last month because of low potassium.  I ordered BNP 208 K 3.0 bmet and 2D echo-LVEF 60-65% mod LVH and small pericardial effusion.  She comes in for f/u. She feels much-back to normal, no shortness of breath or edema.           Past Medical History:  Diagnosis Date   Asthma dx 06/04/07   ventolin, singulair   Blood transfusion without reported diagnosis    CHF (congestive heart failure) ([place]) 03/19/2008   a. 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 normalized by repeat imaging and at 55-60% in  04/2019,   DCM (dilated cardiomyopathy) ([place]) dx 06/06/07   EF 10-15%   Degeneration of spine    DJD (degenerative joint disease), lumbar 11/16/2016   chronic lumbar pain   GERD (gastroesophageal reflux disease)    takes Nexium Daily   Goiter 09/09/2020   Hyperlipidemia, unspecified 09/21/2021   Hypertension    Sciatica 09/21/2021   Seasonal allergies    takes zyrtec   Current Medications: Current Meds  Medication Sig   albuterol (PROVENTIL) (2.5 MG/3ML) 0.083% nebulizer solution Take 3 mLs (2.5 mg total) by nebulization every 6 (six) hours as needed for wheezing or shortness of breath.   atorvastatin (LIPITOR) 10 MG tablet Take 1 tablet (10 mg total) by mouth daily.   baclofen (LIORESAL) 10 MG tablet Take 1 tablet (10 mg total) by mouth 3 (three) times daily as needed for muscle spasms.   carvedilol (COREG) 25 MG tablet TAKE ONE TABLET BY MOUTH 2 TIMES A DAY   cetirizine (ZYRTEC) 10 MG tablet Take 1 tablet (10 mg total) by mouth daily.   chlorthalidone (HYGROTON) 25 MG tablet Take 0.5 tablets (12.5 mg total) by mouth daily.   estradiol (ESTRACE) 2 MG tablet TAKE 1 TABLET BY MOUTH ONCE A DAY.   fluticasone (FLONASE) 50 MCG/ACT nasal spray Place 1 spray into both nostrils daily.   fluticasone-salmeterol (ADVAIR DISKUS) 500-50 MCG/ACT AEPB Inhale 1  puff into the lungs in the morning and at bedtime.   gabapentin (NEURONTIN) 800 MG tablet Take 1 tablet (800 mg total) by mouth 3 (three) times daily.   losartan (COZAAR) 50 MG tablet Take 1 tablet (50 mg total) by mouth daily.   lubiprostone (AMITIZA) 8 MCG capsule TAKE (1) CAPSULE BY MOUTH TWICE DAILY.   Minoxidil (MINOXIDIL FOR WOMEN) 5 % FOAM Apply 1/2 capful to scalp/hair once daily.   montelukast (SINGULAIR) 10 MG tablet Take 1 tablet (10 mg total) by mouth daily.   ondansetron (ZOFRAN) 4 MG tablet Take 1 tablet (4 mg total) by mouth every 8 (eight) hours as needed for nausea or vomiting.   pantoprazole (PROTONIX) 40 MG tablet TAKE 1 TABLET  BY MOUTH TWICE DAILY BEFORE MEALS.   potassium chloride SA (KLOR-CON M) 20 MEQ tablet Take 1 tablet (20 mEq total) by mouth daily.   SUMAtriptan (IMITREX) 50 MG tablet Take 1 tablet (50 mg total) by mouth 2 (two) times daily as needed for migraine. May repeat in 2 hours if headache persists or recurs.   Vitamin D, Ergocalciferol, (DRISDOL) 1.25 MG (50000 UNIT) CAPS capsule Take 1 capsule (50,000 Units total) by mouth every 7 (seven) days.    Allergies:   Patient has no known allergies.   Social History   Tobacco Use   Smoking status: Former    Packs/day: 0.25    Years: 4.00    Total pack years: 1.00    Types: Cigarettes    Quit date: 03/14/2009    Years since quitting: 13.2   Smokeless tobacco: Never  Vaping Use   Vaping Use: Never used  Substance Use Topics   Alcohol use: No   Drug use: No    Family Hx: The patient's family history includes Arthritis in her father; Cancer in her maternal uncle; Congestive Heart Failure in her paternal grandfather and paternal grandmother; Congestive Heart Failure (age of onset: 35) in her father; Dementia in her maternal grandmother; Diabetes in her mother; Hypertension in her mother. There is no history of Anesthesia problems, Hypotension, Pseudochol deficiency, Malignant hyperthermia, Colon cancer, or Colon polyps.  ROS     Physical Exam:    VS:  BP 102/60   Pulse 83   Ht '5\' 3"'$  (1.6 m)   Wt 145 lb 6.4 oz (66 kg)   LMP 07/04/2010   SpO2 97%   BMI 25.76 kg/m     Wt Readings from Last 3 Encounters:  06/12/22 145 lb 6.4 oz (66 kg)  05/29/22 143 lb (64.9 kg)  05/23/22 155 lb (70.3 kg)    Physical Exam  GEN: Well nourished, well developed, in no acute distress  Neck: no JVD, carotid bruits, or masses Cardiac:RRR; 2/6 systolic murmur LSB.  Respiratory:  clear to auscultation bilaterally, normal work of breathing GI: soft, nontender, nondistended, + BS Ext: without cyanosis, clubbing, or edema, Good distal pulses bilaterally Neuro:   Alert and Oriented x 3,  Psych: euthymic mood, full affect        EKGs/Labs/Other Test Reviewed:    EKG:  EKG is  not ordered today.     Recent Labs: 11/02/2021: TSH 0.480 04/07/2022: ALT 6; Hemoglobin 15.5; Platelets 311 05/30/2022: B Natriuretic Peptide 33.0; BUN 10; Creatinine, Ser 1.01; Potassium 3.7; Sodium 133   Recent Lipid Panel Recent Labs    04/07/22 1101  CHOL 163  TRIG 162*  HDL 54  LDLCALC 81     Prior CV Studies:     Echo 05/24/22  IMPRESSIONS     1. Left ventricular ejection fraction, by estimation, is 60 to 65%. The  left ventricle has normal function. The left ventricle has no regional  wall motion abnormalities. There is moderate concentric left ventricular  hypertrophy. Left ventricular  diastolic parameters are indeterminate.   2. Right ventricular systolic function is normal. The right ventricular  size is normal. There is normal pulmonary artery systolic pressure. The  estimated right ventricular systolic pressure is 0000000 mmHg.   3. Left atrial size was severely dilated.   4. A small pericardial effusion is present, moderate posterior  collection. The pericardial effusion is circumferential. Some undulation  of the right atrial free wall without compression. No clear tamponade  physiology.   5. The mitral valve is abnormal, mildly thickened with restricted leaflet  motion. Moderate mitral valve regurgitation. Mild mitral stenosis. MVA by  planimetry 1.4 cm2.   6. The aortic valve is tricuspid. Aortic valve regurgitation is moderate.  Aortic regurgitation PHT measures 371 msec. Aortic valve mean gradient  measures 10.0 mmHg.   Comparison(s): Prior images reviewed side by side. Pericardial effusion  has increased in size. Moderate LVH with valvular abnormalities as noted -  consider cardiac MRI to exclude infiltrative cardiomyopathy and further  evaluate valvular disease.  Carotids 09/19/21 IMPRESSION: Carotid duplex compatible with  cerebrovascular FMD, worst on the right side. No significant atherosclerotic changes. Confirmatory testing might include CT angiogram neck, formal cervical-cerebral angiogram, or MR angiogram.   Signed,   Dulcy Fanny. Nadene Rubins, RPVI   [facility]   [facility]     Electronically Signed   By: Corrie Mckusick D.O.   On: 09/19/2021 13:12   MRI 09/2021 IMPRESSION: No emergent large vessel occlusion or proximal hemodynamically significant stenosis.     MRA abd/pelvis 09/2020 IMPRESSION: 1. 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 2. No evidence of aneurysm or dissection. 3. Scattered atherosclerotic plaque in the aorta, iliac and femoral arteries. Aortic Atherosclerosis (ICD10-I70.0). 4. Small penetrating atherosclerotic ulcer in the right common iliac artery. 5. No acute abnormality within the abdomen or pelvis.   Signed,   Criselda Peaches, MD, [place]   [facility]   [facility]     Electronically Signed   By: Jacqulynn Cadet M.D.   On: 10/05/2020 09:42   MRA neck 09/2020 IMPRESSION: Findings consistent with fibromuscular dysplasia of the internal carotid artery bilaterally, left greater than right. There is mild fusiform dilatation of the internal carotid artery below the skull base bilaterally which may be related to fibromuscular dysplasia. No dissection identified. There is probable mild fibromuscular dysplasia left vertebral artery.   Thyroid goiter. 14 mm right thyroid nodule and 12 mm left thyroid nodule. No further imaging necessary. (Ref: J Am Coll Radiol. 2015 Feb;12(2): 143-50).     Electronically Signed   By: Franchot Gallo M.D.   On: 08/31/2020 12:00  NST: 10/2017   Carotid Dopplers: 06/2020 IMPRESSION: 1. Tortuosity is noted  about the distal internal carotid arteries bilaterally with associated elevated flow velocities. No sonographic evidence of significant flow limiting stenosis or atherosclerotic plaque formation. These findings could be seen with fibromuscular dysplasia. 2. 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   Consider CTA neck for further morphologic characterization of the internal carotid arteries.   Echocardiogram: 06/2020  IMPRESSIONS     1. Left ventricular ejection fraction, by estimation, is 60 to 65%. The  left ventricle has normal function. The left ventricle has no regional  wall motion abnormalities. There is moderate left ventricular hypertrophy.  Left ventricular diastolic  parameters are indeterminate.   2. Right ventricular systolic function is normal. The right ventricular  size is normal. Tricuspid regurgitation signal is inadequate for assessing  PA pressure.   3. Left atrial size was upper normal.   4. There is a trivial pericardial effusion posterior to the left  ventricle.   5. The mitral valve is abnormal, mildly thickened with somewhat  restricted motion. Mild to moderate mitral valve regurgitation.   6. The aortic valve is tricuspid. Aortic valve regurgitation is mild to  moderate. Aortic regurgitation PHT measures 339 msec. Aortic valve mean  gradient measures 9.0 mmHg.   7. The inferior vena cava is normal in size with greater than 50%  respiratory variability, suggesting right atrial pressure of 3 mmHg.       Risk Assessment/Calculations/Metrics:              ASSESSMENT & PLAN:   No problem-specific Assessment & Plan notes found for this encounter.   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 Chlorthalidone stopped a  month ago b/c of low K. BNP 208, K 3.0, Echo with normal LVEF, mod LVH, small pericardial effusion.  Symptoms improved with chlorthalidone BP running low. I reduced losartan to 50 mg once a day continue chlorthalidone for now recheck BNP 33 and potassium 3.7  PA and lateral chest x-ray NAD. repeat limited echo for pericardial effusion.   Fibromuscular dysplasia carotids, renals, iliac. Good BP control.   MRA of head  6/122/23 no large vessel occlusion or stenosis and repeat carotid US 09/2021 consistent with FMD   Atherosclerotic plaque in aorta, iliac and femoral arteries on MRA 09/2020 no symptoms   HTN BP running low but stable on losartan 50 mg daily.   HLD LDL 98 06/2021 on atorvastatin   MR/AI moderate MR and mild MS, mod AI on echo last week                Dispo:  No follow-ups on file.   Medication Adjustments/Labs and Tests Ordered: Current medicines are reviewed at length with the patient today.  Concerns regarding medicines are outlined above.  Tests Ordered: Orders Placed This Encounter  Procedures   Basic Metabolic Panel (BMET)   ECHOCARDIOGRAM LIMITED   Medication Changes: No orders of the defined types were placed in this encounter.  Signed, Ermalinda Barrios, PA-C  06/12/2022 9:45 AM    [address], [place], [place]  [address] Phone: [phone]; Fax: [phone]

## 2022-06-05 ENCOUNTER — Other Ambulatory Visit: Payer: Self-pay | Admitting: Family Medicine

## 2022-06-05 ENCOUNTER — Encounter: Payer: Self-pay | Admitting: Family Medicine

## 2022-06-05 MED ORDER — VITAMIN D (ERGOCALCIFEROL) 1.25 MG (50000 UNIT) PO CAPS: 50000.0000 [IU] | ORAL_CAPSULE | ORAL | 0 refills | Status: DC

## 2022-06-08 ENCOUNTER — Encounter: Payer: Self-pay | Admitting: Radiology

## 2022-06-12 ENCOUNTER — Encounter: Payer: Self-pay | Admitting: Physician Assistant

## 2022-06-12 ENCOUNTER — Ambulatory Visit: Payer: Medicaid Other | Attending: Physician Assistant | Admitting: Physician Assistant

## 2022-06-12 VITALS — BP 102/60 | HR 83 | Ht 63.0 in | Wt 145.4 lb

## 2022-06-12 DIAGNOSIS — I773 Arterial fibromuscular dysplasia: Secondary | ICD-10-CM | POA: Diagnosis not present

## 2022-06-12 DIAGNOSIS — I10 Essential (primary) hypertension: Secondary | ICD-10-CM

## 2022-06-12 DIAGNOSIS — I700 Atherosclerosis of aorta: Secondary | ICD-10-CM

## 2022-06-12 DIAGNOSIS — I340 Nonrheumatic mitral (valve) insufficiency: Secondary | ICD-10-CM

## 2022-06-12 DIAGNOSIS — I428 Other cardiomyopathies: Secondary | ICD-10-CM

## 2022-06-12 DIAGNOSIS — E785 Hyperlipidemia, unspecified: Secondary | ICD-10-CM

## 2022-06-12 NOTE — Patient Instructions (Signed)
Medication Instructions:  Your physician recommends that you continue on your current medications as directed. Please refer to the Current Medication list given to you today.  *If you need a refill on your cardiac medications before your next appointment, please call your pharmacy*   Lab Work: Your physician recommends that you return for lab work today.   If you have labs (blood work) drawn today and your tests are completely normal, you will receive your results only by: [place] (if you have MyChart) OR A paper copy in the mail If you have any lab test that is abnormal or we need to change your treatment, we will call you to review the results.   Testing/Procedures: Your physician has requested that you have an echocardiogram. Echocardiography is a painless test that uses sound waves to create images of your heart. It provides your doctor with information about the size and shape of your heart and how well your heart's chambers and valves are working. This procedure takes approximately one hour. There are no restrictions for this procedure. Please do NOT wear cologne, perfume, aftershave, or lotions (deodorant is allowed). Please arrive 15 minutes prior to your appointment time.    Follow-Up: At [facility], you and your health needs are our priority.  As part of our continuing mission to provide you with exceptional heart care, we have created designated Provider Care Teams.  These Care Teams include your primary Cardiologist (physician) and Advanced Practice Providers (APPs -  Physician Assistants and Nurse Practitioners) who all work together to provide you with the care you need, when you need it.  We recommend signing up for the patient portal called "MyChart".  Sign up information is provided on this After Visit Summary.  MyChart is used to connect with patients for Virtual Visits (Telemedicine).  Patients are able to view lab/test results, encounter notes, upcoming  appointments, etc.  Non-urgent messages can be sent to your provider as well.   To learn more about what you can do with MyChart, go to [email or web address].    Your next appointment:    May   Provider:   Dorris Carnes, MD    Other Instructions Thank you for choosing [facility]!

## 2022-06-13 ENCOUNTER — Other Ambulatory Visit (HOSPITAL_COMMUNITY): Payer: Self-pay | Admitting: Family Medicine

## 2022-06-13 DIAGNOSIS — Z1231 Encounter for screening mammogram for malignant neoplasm of breast: Secondary | ICD-10-CM

## 2022-06-13 LAB — BASIC METABOLIC PANEL
BUN/Creatinine Ratio: 12 (ref 9–23)
BUN: 11 mg/dL (ref 6–24)
CO2: 24 mmol/L (ref 20–29)
Calcium: 9.2 mg/dL (ref 8.7–10.2)
Chloride: 99 mmol/L (ref 96–106)
Creatinine, Ser: 0.91 mg/dL (ref 0.57–1.00)
Glucose: 87 mg/dL (ref 70–99)
Potassium: 4 mmol/L (ref 3.5–5.2)
Sodium: 137 mmol/L (ref 134–144)
eGFR: 77 mL/min/{1.73_m2} (ref >59)

## 2022-06-16 ENCOUNTER — Ambulatory Visit: Payer: Medicaid Other | Admitting: Family Medicine

## 2022-06-16 ENCOUNTER — Encounter: Payer: Self-pay | Admitting: Family Medicine

## 2022-06-16 VITALS — BP 118/68 | HR 86 | Temp 98.1°F | Ht 63.0 in | Wt 148.0 lb

## 2022-06-16 DIAGNOSIS — H6993 Unspecified Eustachian tube disorder, bilateral: Secondary | ICD-10-CM

## 2022-06-16 DIAGNOSIS — H6990 Unspecified Eustachian tube disorder, unspecified ear: Secondary | ICD-10-CM | POA: Insufficient documentation

## 2022-06-16 MED ORDER — CETIRIZINE-PSEUDOEPHEDRINE ER 5-120 MG PO TB12: 1.0000 | ORAL_TABLET | Freq: Two times a day (BID) | ORAL | 0 refills | Status: DC

## 2022-06-16 NOTE — Patient Instructions (Addendum)
Zyrtec d in replace of zyrtec for the next few days to 1 week.  OTC Ibuprofen will help with the discomfort as well.   No evidence of infection.

## 2022-06-16 NOTE — Progress Notes (Signed)
Subjective:  Patient ID: Destiny Manning, female    DOB: 1971/02/07  Age: 52 y.o. MRN: [id number]  CC: Chief Complaint  Patient presents with   Ear Pain    Rattling in left ear worse than right , very  sensitive to touch    Sinus Problem    Runny nose     HPI:  52 year old female presents for evaluation of the above.  3-day history of bilateral ear pain, left greater than right.  She has had some runny nose.  She states that it feels like she has fluid in her ears.  No fever.  No relieving factors.  No other complaints.  Patient Active Problem List   Diagnosis Date Noted   Eustachian tube dysfunction 06/16/2022   Hair loss 05/11/2022   History of cardiomyopathy 04/07/2022   Fibromuscular dysplasia ([place]) 04/07/2022   Atherosclerosis of aorta ([place]) 04/07/2022   S/P hysterectomy with oophorectomy 01/04/2022   DDD (degenerative disc disease), cervical 09/21/2021   Hyperlipidemia, unspecified 09/21/2021   Migraine 09/21/2021   Chronic right shoulder pain 05/19/2021   Multinodular goiter 09/23/2020   Hypertension 11/16/2016   Asthma in adult, mild persistent, uncomplicated [id number]   Chronic lower back pain 11/16/2016   Gastroesophageal reflux disease 08/13/2012    Social Hx   Social History   Socioeconomic History   Marital status: Single    Spouse name: Not on file   Number of children: 2   Years of education: 16   Highest education level: Not on file  Occupational History   Occupation: [employer]    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  Tobacco Use   Smoking status: Former    Packs/day: 0.25    Years: 4.00    Total pack years: 1.00    Types: Cigarettes    Quit date: 03/14/2009    Years since quitting: 13.2   Smokeless tobacco: Never  Vaping Use   Vaping Use: Never used  Substance and Sexual Activity   Alcohol use: No   Drug use: No   Sexual activity: Not Currently    Birth control/protection: Surgical    Comment: hyst  Other Topics Concern    Not on file  Social History Narrative   Actuary at [employer]; 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      Lives with 2 children- age 11 and 12; she homeschools them   Social Determinants of Health   Financial Resource Strain: Low Risk  (01/04/2022)   Overall Financial Resource Strain (CARDIA)    Difficulty of Paying Living Expenses: Not hard at all  Food Insecurity: No Food Insecurity (01/04/2022)   Hunger Vital Sign    Worried About Running Out of Food in the Last Year: Never true    Ran Out of Food in the Last Year: Never true  Transportation Needs: No Transportation Needs (01/04/2022)   PRAPARE - Hydrologist (Medical): No    Lack of Transportation (Non-Medical): No  Physical Activity: Inactive (01/04/2022)   Exercise Vital Sign    Days of Exercise per Week: 0 days    Minutes of Exercise per Session: 0 min  Stress: No Stress Concern Present (01/04/2022)   [place]    Feeling of Stress : Not at all  Social Connections: Moderately Isolated (01/04/2022)   Social Connection and Isolation Panel [NHANES]    Frequency of Communication with Friends and Family: More than three times a week  Frequency of Social Gatherings with Friends and Family: Once a week    Attends Religious Services: 1 to 4 times per year    Active Member of [employer] or Organizations: No    Attends Music therapist: Never    Marital Status: Never married    Review of Systems Per HPI  Objective:  BP 118/68   Pulse 86   Temp 98.1 F (36.7 C)   Ht '5\' 3"'$  (1.6 m)   Wt 148 lb (67.1 kg)   LMP 07/04/2010   SpO2 99%   BMI 26.22 kg/m      06/16/2022   10:29 AM 06/12/2022    9:29 AM 05/29/2022    2:01 PM  BP/Weight  Systolic BP [id number]  Diastolic BP 68 60 58  Wt. (Lbs) 148 145.4 143  BMI 26.22 kg/m2 25.76 kg/m2 25.33 kg/m2    Physical Exam Vitals and nursing note reviewed.  Constitutional:       Appearance: Normal appearance.  HENT:     Head: Normocephalic and atraumatic.  Eyes:     General:        Right eye: No discharge.        Left eye: No discharge.     Conjunctiva/sclera: Conjunctivae normal.  Pulmonary:     Effort: Pulmonary effort is normal. No respiratory distress.  Neurological:     Mental Status: She is alert.  Psychiatric:        Mood and Affect: Mood normal.        Behavior: Behavior normal.     Lab Results  Component Value Date   WBC 7.3 04/07/2022   HGB 15.5 04/07/2022   HCT 45.9 04/07/2022   PLT 311 04/07/2022   GLUCOSE 87 06/12/2022   CHOL 163 04/07/2022   TRIG 162 (H) 04/07/2022   HDL 54 04/07/2022   LDLCALC 81 04/07/2022   ALT 6 04/07/2022   AST 13 04/07/2022   NA 137 06/12/2022   K 4.0 06/12/2022   CL 99 06/12/2022   CREATININE 0.91 06/12/2022   BUN 11 06/12/2022   CO2 24 06/12/2022   TSH 0.480 11/02/2021     Assessment & Plan:   Problem List Items Addressed This Visit       Nervous and Auditory   Eustachian tube dysfunction - Primary    TMs normal bilaterally.  Brief use of Zyrtec-D.  Supportive care.       Meds ordered this encounter  Medications   cetirizine-pseudoephedrine (ZYRTEC-D) 5-120 MG tablet    Sig: Take 1 tablet by mouth 2 (two) times daily.    Dispense:  14 tablet    Refill:  0    Follow-up:  Return if symptoms worsen or fail to improve.  [place]

## 2022-06-16 NOTE — Assessment & Plan Note (Signed)
TMs normal bilaterally.  Brief use of Zyrtec-D.  Supportive care.

## 2022-06-19 ENCOUNTER — Encounter: Payer: Self-pay | Admitting: *Deleted

## 2022-06-19 MED ORDER — CETIRIZINE-PSEUDOEPHEDRINE ER 5-120 MG PO TB12: 1.0000 | ORAL_TABLET | Freq: Two times a day (BID) | ORAL | 0 refills | Status: DC

## 2022-06-19 NOTE — Telephone Encounter (Signed)
Pt is saying it needs to be Zyrtec D pharmacy did not get can this be recent ?    [place] [phone]  Please advise.

## 2022-06-19 NOTE — Telephone Encounter (Signed)
Patient notified and Rx sent to pharmacy. Patient advised Per Dr Lacinda Axon she can buy over OTC.Marland Kitchen Patient verbalized understanding.

## 2022-06-19 NOTE — Telephone Encounter (Signed)
Pt is saying it needs to be Zyrtec D pharmacy did not get can this be recent ?   Destiny Manning [phone]

## 2022-06-22 ENCOUNTER — Ambulatory Visit: Payer: Medicaid Other | Admitting: Family Medicine

## 2022-06-26 ENCOUNTER — Other Ambulatory Visit: Payer: Self-pay | Admitting: Family Medicine

## 2022-06-26 DIAGNOSIS — M545 Low back pain, unspecified: Secondary | ICD-10-CM

## 2022-06-28 ENCOUNTER — Other Ambulatory Visit: Payer: Self-pay | Admitting: Family Medicine

## 2022-06-28 ENCOUNTER — Encounter: Payer: Self-pay | Admitting: Family Medicine

## 2022-06-28 DIAGNOSIS — M545 Low back pain, unspecified: Secondary | ICD-10-CM

## 2022-06-28 MED ORDER — BACLOFEN 10 MG PO TABS: ORAL_TABLET | ORAL | 3 refills | Status: DC

## 2022-06-30 ENCOUNTER — Other Ambulatory Visit: Payer: Self-pay | Admitting: Gastroenterology

## 2022-06-30 DIAGNOSIS — K219 Gastro-esophageal reflux disease without esophagitis: Secondary | ICD-10-CM

## 2022-06-30 NOTE — Telephone Encounter (Signed)
Destiny Manning, not sure if Cyril Mourning is in office or if this needs to go to oncall person. med Last filled by chelsea when she was covering but I can't see where chelsea ever saw her. Cyril Mourning last seen on 11/25/21 for gerd and pantoprazole was increased at that visit from qd to bid. Note to follow up in 3 months which I dont see where pt did a follow up and does not have upcoming appt.

## 2022-07-05 NOTE — Telephone Encounter (Signed)
I just seen this I will check on it. I'm sending it to [facility].

## 2022-07-08 NOTE — Telephone Encounter (Signed)
I am sending in refills. Lets get her scheduled for routine follow-up.

## 2022-07-10 ENCOUNTER — Ambulatory Visit (HOSPITAL_COMMUNITY)
Admission: RE | Admit: 2022-07-10 | Discharge: 2022-07-10 | Disposition: A | Discharge status: Home / Self Care | Payer: Medicaid Other | Source: Ambulatory Visit | Attending: Family Medicine | Admitting: Family Medicine

## 2022-07-10 DIAGNOSIS — Z1231 Encounter for screening mammogram for malignant neoplasm of breast: Secondary | ICD-10-CM | POA: Diagnosis not present

## 2022-07-12 ENCOUNTER — Ambulatory Visit (HOSPITAL_COMMUNITY)
Admission: RE | Admit: 2022-07-12 | Discharge: 2022-07-12 | Disposition: A | Discharge status: Home / Self Care | Payer: Medicaid Other | Source: Ambulatory Visit | Attending: Physician Assistant | Admitting: Physician Assistant

## 2022-07-12 DIAGNOSIS — I428 Other cardiomyopathies: Secondary | ICD-10-CM | POA: Diagnosis present

## 2022-07-12 DIAGNOSIS — I3139 Other pericardial effusion (noninflammatory): Secondary | ICD-10-CM

## 2022-07-12 DIAGNOSIS — I340 Nonrheumatic mitral (valve) insufficiency: Secondary | ICD-10-CM | POA: Diagnosis present

## 2022-07-12 LAB — ECHOCARDIOGRAM LIMITED: S' Lateral: 2.8 cm

## 2022-07-12 NOTE — Progress Notes (Signed)
*  PRELIMINARY RESULTS* Echocardiogram Limited 2-D Echocardiogram  has been performed.  Samuel Germany 07/12/2022, 10:15 AM

## 2022-07-16 NOTE — Progress Notes (Deleted)
Referring Provider: Tommie Sams, DO Primary Care Physician:  Tommie Sams, DO Primary GI Physician: Dr. Jena Gauss  No chief complaint on file.   HPI:   Destiny Manning is a 52 y.o. female with history of esophageal dysphagia s/p empiric dilations, GERD, chronic constipation, adenomatous colon polyps currently due for surveillance, presenting today for follow-up ***  Last seen via virtual visit 11/25/2021.  GERD not adequately managed on pantoprazole 40 mg daily.  Had also tried taking omeprazole 20 mg at bedtime but did not notice much improvement.  She is having daily GERD symptoms.  Intermittent nausea, single episode of vomiting.  She is being careful with her diet.  Denied NSAIDs.  Previously on Prilosec, Nexium, and most recently Dexilant prior to trial of pantoprazole.  Also continued with intermittent dysphagia without significant change, no real improvement after dilation in 2022, which she preferred to monitor.  Plan to increase pantoprazole to 40 mg twice daily.  Today:   GERD:   Dysphagia:      Past Medical History:  Diagnosis Date   Asthma dx 06/04/07   ventolin, singulair   Blood transfusion without reported diagnosis    CHF (congestive heart failure) (HCC) 03/19/2008   a. 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 normalized by repeat imaging and at 55-60% in 04/2019,   DCM (dilated cardiomyopathy) (HCC) dx 06/06/07   EF 10-15%   Degeneration of spine    DJD (degenerative joint disease), lumbar 11/16/2016   chronic lumbar pain   GERD (gastroesophageal reflux disease)    takes Nexium Daily   Goiter 09/09/2020   Hyperlipidemia, unspecified 09/21/2021   Hypertension    Sciatica 09/21/2021   Seasonal allergies    takes zyrtec    Past Surgical History:  Procedure Laterality Date   ABDOMINAL HYSTERECTOMY     fibroids; partial- ovaries intact   BIOPSY  02/17/2021   Procedure: BIOPSY;  Surgeon: Corbin Ade, MD;  [facility];  Service:  Endoscopy;;   CERVICAL SPINE SURGERY  2002   [facility], ruptured disc   CHOLECYSTECTOMY  2001   laparoscopic, [facility]   COLONOSCOPY WITH PROPOFOL N/A 07/12/2017   tubular adenoma (10 mm polyp in cecum), segmental biopsies negative. 5 year surveillance   ESOPHAGOGASTRODUODENOSCOPY (EGD) WITH ESOPHAGEAL DILATION N/A 09/05/2012   XMI:[id number] hiatal hernia s/p esophageal dilation with 54 F Maloney.    ESOPHAGOGASTRODUODENOSCOPY (EGD) WITH PROPOFOL N/A 07/12/2017   normal esophagus s/p dilation, normal duodenal bulb and second portion of duodenum   ESOPHAGOGASTRODUODENOSCOPY (EGD) WITH PROPOFOL N/A 02/17/2021   Surgeon: Corbin Ade, MD;   Normal esophagus s/p dilation, normal stomach and examined duodenum.   LAPAROSCOPIC BILATERAL SALPINGO OOPHERECTOMY Bilateral 12/15/2020   Procedure: LAPAROSCOPIC BILATERAL SALPINGO OOPHORECTOMY;  Surgeon: Lazaro Arms, MD;  Location: [facility];  Service: Gynecology;  Laterality: Bilateral;   MALONEY DILATION N/A 07/12/2017   Procedure: Elease Hashimoto DILATION;  Surgeon: Corbin Ade, MD;  [facility];  Service: Endoscopy;  Laterality: N/A;   MALONEY DILATION N/A 02/17/2021   Procedure: Elease Hashimoto DILATION;  Surgeon: Corbin Ade, MD;  [facility];  Service: Endoscopy;  Laterality: N/A;   POLYPECTOMY  07/12/2017   Procedure: POLYPECTOMY;  Surgeon: Corbin Ade, MD;  [facility];  Service: Endoscopy;;  Cecal polyp (HS)   SPINE SURGERY     TUBAL LIGATION     VAGINAL HYSTERECTOMY  10/19/2010   Procedure: HYSTERECTOMY VAGINAL;  Surgeon: Omelia Blackwater  Lauretta ChesterH Eure, MD;  Location: [facility];  Service: Gynecology;  Laterality: N/A;    Current Outpatient Medications  Medication Sig Dispense Refill   albuterol (PROVENTIL) (2.5 MG/3ML) 0.083% nebulizer solution Take 3 mLs (2.5 mg total) by nebulization every 6 (six) hours as needed for wheezing or shortness of breath. 75 mL 0   atorvastatin (LIPITOR) 10 MG tablet Take 1 tablet (10 mg total) by mouth  daily. 90 tablet 3   baclofen (LIORESAL) 10 MG tablet TAKE 1 TABLET BY MOUTH WITH FOOD OR MILK 3 TIMES DAILY AS NEEDED. 90 tablet 3   carvedilol (COREG) 25 MG tablet TAKE ONE TABLET BY MOUTH 2 TIMES A DAY 60 tablet 6   cetirizine-pseudoephedrine (ZYRTEC-D) 5-120 MG tablet Take 1 tablet by mouth 2 (two) times daily. 14 tablet 0   chlorthalidone (HYGROTON) 25 MG tablet Take 0.5 tablets (12.5 mg total) by mouth daily. 45 tablet 3   estradiol (ESTRACE) 2 MG tablet TAKE 1 TABLET BY MOUTH ONCE A DAY. 30 tablet 11   fluticasone (FLONASE) 50 MCG/ACT nasal spray Place 1 spray into both nostrils daily. 16 g 3   fluticasone-salmeterol (ADVAIR DISKUS) 500-50 MCG/ACT AEPB Inhale 1 puff into the lungs in the morning and at bedtime. 60 each 6   gabapentin (NEURONTIN) 800 MG tablet Take 1 tablet (800 mg total) by mouth 3 (three) times daily. 270 tablet 1   losartan (COZAAR) 50 MG tablet Take 1 tablet (50 mg total) by mouth daily. 90 tablet 3   lubiprostone (AMITIZA) 8 MCG capsule TAKE (1) CAPSULE BY MOUTH TWICE DAILY. 60 capsule 5   Minoxidil (MINOXIDIL FOR WOMEN) 5 % FOAM Apply 1/2 capful to scalp/hair once daily. 60 g 3   montelukast (SINGULAIR) 10 MG tablet Take 1 tablet (10 mg total) by mouth daily. 90 tablet 1   ondansetron (ZOFRAN) 4 MG tablet Take 1 tablet (4 mg total) by mouth every 8 (eight) hours as needed for nausea or vomiting. 30 tablet 1   pantoprazole (PROTONIX) 40 MG tablet TAKE 1 TABLET BY MOUTH TWICE DAILY BEFORE MEALS. 180 tablet 0   potassium chloride SA (KLOR-CON M) 20 MEQ tablet Take 1 tablet (20 mEq total) by mouth daily. 90 tablet 3   SUMAtriptan (IMITREX) 50 MG tablet Take 1 tablet (50 mg total) by mouth 2 (two) times daily as needed for migraine. May repeat in 2 hours if headache persists or recurs. 20 tablet 2   Vitamin D, Ergocalciferol, (DRISDOL) 1.25 MG (50000 UNIT) CAPS capsule Take 1 capsule (50,000 Units total) by mouth every 7 (seven) days. 12 capsule 0   No current  facility-administered medications for this visit.    Allergies as of 07/19/2022   (No Known Allergies)    Family History  Problem Relation Age of Onset   Diabetes Mother    Hypertension Mother    Congestive Heart Failure Father 4050   Arthritis Father    Dementia Maternal Grandmother    Congestive Heart Failure Paternal Grandmother    Congestive Heart Failure Paternal Grandfather    Cancer Maternal Uncle    Anesthesia problems Neg Hx    Hypotension Neg Hx    Pseudochol deficiency Neg Hx    Malignant hyperthermia Neg Hx    Colon cancer Neg Hx    Colon polyps Neg Hx     Social History   Socioeconomic History   Marital status: Single    Spouse name: Not on file   Number of children: 2   Years of  education: 16   Highest education level: Not on file  Occupational History   Occupation: [employer]    Comment: Comptroller- works with IVC patients  Tobacco Use   Smoking status: Former    Packs/day: 0.25    Years: 4.00    Additional pack years: 0.00    Total pack years: 1.00    Types: Cigarettes    Quit date: 03/14/2009    Years since quitting: 13.3   Smokeless tobacco: Never  Vaping Use   Vaping Use: Never used  Substance and Sexual Activity   Alcohol use: No   Drug use: No   Sexual activity: Not Currently    Birth control/protection: Surgical    Comment: hyst  Other Topics Concern   Not on file  Social History Narrative   Comptroller at 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      Lives with 2 children- age 32 and 53; she homeschools them   Social Determinants of Health   Financial Resource Strain: Low Risk  (01/04/2022)   Overall Financial Resource Strain (CARDIA)    Difficulty of Paying Living Expenses: Not hard at all  Food Insecurity: No Food Insecurity (01/04/2022)   Hunger Vital Sign    Worried About Running Out of Food in the Last Year: Never true    Ran Out of Food in the Last Year: Never true  Transportation Needs: No Transportation Needs (01/04/2022)    PRAPARE - Administrator, Civil Service (Medical): No    Lack of Transportation (Non-Medical): No  Physical Activity: Inactive (01/04/2022)   Exercise Vital Sign    Days of Exercise per Week: 0 days    Minutes of Exercise per Session: 0 min  Stress: No Stress Concern Present (01/04/2022)   [employer] of Occupational Health - Occupational Stress Questionnaire    Feeling of Stress : Not at all  Social Connections: Moderately Isolated (01/04/2022)   Social Connection and Isolation Panel [NHANES]    Frequency of Communication with Friends and Family: More than three times a week    Frequency of Social Gatherings with Friends and Family: Once a week    Attends Religious Services: 1 to 4 times per year    Active Member of [employer] or Organizations: No    Attends Banker Meetings: Never    Marital Status: Never married    Review of Systems: Gen: Denies fever, chills, anorexia. Denies fatigue, weakness, weight loss.  CV: Denies chest pain, palpitations, syncope, peripheral edema, and claudication. Resp: Denies dyspnea at rest, cough, wheezing, coughing up blood, and pleurisy. GI: Denies vomiting blood, jaundice, and fecal incontinence.   Denies dysphagia or odynophagia. Derm: Denies rash, itching, dry skin Psych: Denies depression, anxiety, memory loss, confusion. No homicidal or suicidal ideation.  Heme: Denies bruising, bleeding, and enlarged lymph nodes.  Physical Exam: LMP 07/04/2010  General:   Alert and oriented. No distress noted. Pleasant and cooperative.  Head:  Normocephalic and atraumatic. Eyes:  Conjuctiva clear without scleral icterus. Heart:  S1, S2 present without murmurs appreciated. Lungs:  Clear to auscultation bilaterally. No wheezes, rales, or rhonchi. No distress.  Abdomen:  +BS, soft, non-tender and non-distended. No rebound or guarding. No HSM or masses noted. Msk:  Symmetrical without gross deformities. Normal posture. Extremities:   Without edema. Neurologic:  Alert and  oriented x4 Psych:  Normal mood and affect.    Assessment:     Plan:  ***   Ermalinda Memos, PA-C [facility] 07/19/2022

## 2022-07-18 NOTE — Progress Notes (Deleted)
Referring Provider: Tommie Sams, DO Primary Care Physician:  Tommie Sams, DO Primary GI Physician: Dr. Jena Gauss  No chief complaint on file.   HPI:   Destiny Manning is a 52 y.o. female with history of esophageal dysphagia s/p empiric dilations, GERD, chronic constipation, adenomatous colon polyps currently due for surveillance, presenting today for follow-up ***   Last seen via virtual visit 11/25/2021.  GERD not adequately managed on pantoprazole 40 mg daily.  Had also tried taking omeprazole 20 mg at bedtime but did not notice much improvement.  She is having daily GERD symptoms.  Intermittent nausea, single episode of vomiting.  She is being careful with her diet.  Denied NSAIDs.  Previously on Prilosec, Nexium, and most recently Dexilant prior to trial of pantoprazole.  Also continued with intermittent dysphagia without significant change, no real improvement after dilation in 2022, which she preferred to monitor.  Plan to increase pantoprazole to 40 mg twice daily.   Today:    GERD:    Dysphagia:     Past Medical History:  Diagnosis Date   Asthma dx 06/04/07   ventolin, singulair   Blood transfusion without reported diagnosis    CHF (congestive heart failure) (HCC) 03/19/2008   a. 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 normalized by repeat imaging and at 55-60% in 04/2019,   DCM (dilated cardiomyopathy) (HCC) dx 06/06/07   EF 10-15%   Degeneration of spine    DJD (degenerative joint disease), lumbar 11/16/2016   chronic lumbar pain   GERD (gastroesophageal reflux disease)    takes Nexium Daily   Goiter 09/09/2020   Hyperlipidemia, unspecified 09/21/2021   Hypertension    Sciatica 09/21/2021   Seasonal allergies    takes zyrtec    Past Surgical History:  Procedure Laterality Date   ABDOMINAL HYSTERECTOMY     fibroids; partial- ovaries intact   BIOPSY  02/17/2021   Procedure: BIOPSY;  Surgeon: Corbin Ade, MD;  [facility];   Service: Endoscopy;;   CERVICAL SPINE SURGERY  2002   [facility], ruptured disc   CHOLECYSTECTOMY  2001   laparoscopic, [facility]   COLONOSCOPY WITH PROPOFOL N/A 07/12/2017   tubular adenoma (10 mm polyp in cecum), segmental biopsies negative. 5 year surveillance   ESOPHAGOGASTRODUODENOSCOPY (EGD) WITH ESOPHAGEAL DILATION N/A 09/05/2012   [id number] hiatal hernia s/p esophageal dilation with 54 F Maloney.    ESOPHAGOGASTRODUODENOSCOPY (EGD) WITH PROPOFOL N/A 07/12/2017   normal esophagus s/p dilation, normal duodenal bulb and second portion of duodenum   ESOPHAGOGASTRODUODENOSCOPY (EGD) WITH PROPOFOL N/A 02/17/2021   Surgeon: Corbin Ade, MD;   Normal esophagus s/p dilation, normal stomach and examined duodenum.   LAPAROSCOPIC BILATERAL SALPINGO OOPHERECTOMY Bilateral 12/15/2020   Procedure: LAPAROSCOPIC BILATERAL SALPINGO OOPHORECTOMY;  Surgeon: Lazaro Arms, MD;  Location: [facility];  Service: Gynecology;  Laterality: Bilateral;   MALONEY DILATION N/A 07/12/2017   Procedure: Elease Hashimoto DILATION;  Surgeon: Corbin Ade, MD;  [facility];  Service: Endoscopy;  Laterality: N/A;   MALONEY DILATION N/A 02/17/2021   Procedure: Elease Hashimoto DILATION;  Surgeon: Corbin Ade, MD;  [facility];  Service: Endoscopy;  Laterality: N/A;   POLYPECTOMY  07/12/2017   Procedure: POLYPECTOMY;  Surgeon: Corbin Ade, MD;  [facility];  Service: Endoscopy;;  Cecal polyp (HS)   SPINE SURGERY     TUBAL LIGATION     VAGINAL HYSTERECTOMY  10/19/2010   Procedure: HYSTERECTOMY VAGINAL;  Surgeon: Lazaro Arms, MD;  Location: [facility];  Service: Gynecology;  Laterality: N/A;    Current Outpatient Medications  Medication Sig Dispense Refill   albuterol (PROVENTIL) (2.5 MG/3ML) 0.083% nebulizer solution Take 3 mLs (2.5 mg total) by nebulization every 6 (six) hours as needed for wheezing or shortness of breath. 75 mL 0   atorvastatin (LIPITOR) 10 MG tablet Take 1 tablet (10 mg total) by  mouth daily. 90 tablet 3   baclofen (LIORESAL) 10 MG tablet TAKE 1 TABLET BY MOUTH WITH FOOD OR MILK 3 TIMES DAILY AS NEEDED. 90 tablet 3   carvedilol (COREG) 25 MG tablet TAKE ONE TABLET BY MOUTH 2 TIMES A DAY 60 tablet 6   cetirizine-pseudoephedrine (ZYRTEC-D) 5-120 MG tablet Take 1 tablet by mouth 2 (two) times daily. 14 tablet 0   chlorthalidone (HYGROTON) 25 MG tablet Take 0.5 tablets (12.5 mg total) by mouth daily. 45 tablet 3   estradiol (ESTRACE) 2 MG tablet TAKE 1 TABLET BY MOUTH ONCE A DAY. 30 tablet 11   fluticasone (FLONASE) 50 MCG/ACT nasal spray Place 1 spray into both nostrils daily. 16 g 3   fluticasone-salmeterol (ADVAIR DISKUS) 500-50 MCG/ACT AEPB Inhale 1 puff into the lungs in the morning and at bedtime. 60 each 6   gabapentin (NEURONTIN) 800 MG tablet Take 1 tablet (800 mg total) by mouth 3 (three) times daily. 270 tablet 1   losartan (COZAAR) 50 MG tablet Take 1 tablet (50 mg total) by mouth daily. 90 tablet 3   lubiprostone (AMITIZA) 8 MCG capsule TAKE (1) CAPSULE BY MOUTH TWICE DAILY. 60 capsule 5   Minoxidil (MINOXIDIL FOR WOMEN) 5 % FOAM Apply 1/2 capful to scalp/hair once daily. 60 g 3   montelukast (SINGULAIR) 10 MG tablet Take 1 tablet (10 mg total) by mouth daily. 90 tablet 1   ondansetron (ZOFRAN) 4 MG tablet Take 1 tablet (4 mg total) by mouth every 8 (eight) hours as needed for nausea or vomiting. 30 tablet 1   pantoprazole (PROTONIX) 40 MG tablet TAKE 1 TABLET BY MOUTH TWICE DAILY BEFORE MEALS. 180 tablet 0   potassium chloride SA (KLOR-CON M) 20 MEQ tablet Take 1 tablet (20 mEq total) by mouth daily. 90 tablet 3   SUMAtriptan (IMITREX) 50 MG tablet Take 1 tablet (50 mg total) by mouth 2 (two) times daily as needed for migraine. May repeat in 2 hours if headache persists or recurs. 20 tablet 2   Vitamin D, Ergocalciferol, (DRISDOL) 1.25 MG (50000 UNIT) CAPS capsule Take 1 capsule (50,000 Units total) by mouth every 7 (seven) days. 12 capsule 0   No current  facility-administered medications for this visit.    Allergies as of 07/21/2022   (No Known Allergies)    Family History  Problem Relation Age of Onset   Diabetes Mother    Hypertension Mother    Congestive Heart Failure Father 35   Arthritis Father    Dementia Maternal Grandmother    Congestive Heart Failure Paternal Grandmother    Congestive Heart Failure Paternal Grandfather    Cancer Maternal Uncle    Anesthesia problems Neg Hx    Hypotension Neg Hx    Pseudochol deficiency Neg Hx    Malignant hyperthermia Neg Hx    Colon cancer Neg Hx    Colon polyps Neg Hx     Social History   Socioeconomic History   Marital status: Single    Spouse name: Not on file   Number of children: 2  Years of education: 3316   Highest education level: Not on file  Occupational History   Occupation: [employer]    Comment: Comptrolleritter- works with IVC patients  Tobacco Use   Smoking status: Former    Packs/day: 0.25    Years: 4.00    Additional pack years: 0.00    Total pack years: 1.00    Types: Cigarettes    Quit date: 03/14/2009    Years since quitting: 13.3   Smokeless tobacco: Never  Vaping Use   Vaping Use: Never used  Substance and Sexual Activity   Alcohol use: No   Drug use: No   Sexual activity: Not Currently    Birth control/protection: Surgical    Comment: hyst  Other Topics Concern   Not on file  Social History Narrative   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      Lives with 2 children- age 50 and 1916; she homeschools them   Social Determinants of Health   Financial Resource Strain: Low Risk  (01/04/2022)   Overall Financial Resource Strain (CARDIA)    Difficulty of Paying Living Expenses: Not hard at all  Food Insecurity: No Food Insecurity (01/04/2022)   Hunger Vital Sign    Worried About Running Out of Food in the Last Year: Never true    Ran Out of Food in the Last Year: Never true  Transportation Needs: No Transportation Needs (01/04/2022)    PRAPARE - Administrator, Civil ServiceTransportation    Lack of Transportation (Medical): No    Lack of Transportation (Non-Medical): No  Physical Activity: Inactive (01/04/2022)   Exercise Vital Sign    Days of Exercise per Week: 0 days    Minutes of Exercise per Session: 0 min  Stress: No Stress Concern Present (01/04/2022)   [employer]ational [employer] - Occupational Stress Questionnaire    Feeling of Stress : Not at all  Social Connections: Moderately Isolated (01/04/2022)   Social Connection and Isolation Panel [NHANES]    Frequency of Communication with Friends and Family: More than three times a week    Frequency of Social Gatherings with Friends and Family: Once a week    Attends Religious Services: 1 to 4 times per year    Active Member of [employer] or Organizations: No    Attends BankerClub or Organization Meetings: Never    Marital Status: Never married    Review of Systems: Gen: Denies fever, chills, anorexia. Denies fatigue, weakness, weight loss.  CV: Denies chest pain, palpitations, syncope, peripheral edema, and claudication. Resp: Denies dyspnea at rest, cough, wheezing, coughing up blood, and pleurisy. GI: Denies vomiting blood, jaundice, and fecal incontinence.   Denies dysphagia or odynophagia. Derm: Denies rash, itching, dry skin Psych: Denies depression, anxiety, memory loss, confusion. No homicidal or suicidal ideation.  Heme: Denies bruising, bleeding, and enlarged lymph nodes.  Physical Exam: LMP 07/04/2010  General:   Alert and oriented. No distress noted. Pleasant and cooperative.  Head:  Normocephalic and atraumatic. Eyes:  Conjuctiva clear without scleral icterus. Heart:  S1, S2 present without murmurs appreciated. Lungs:  Clear to auscultation bilaterally. No wheezes, rales, or rhonchi. No distress.  Abdomen:  +BS, soft, non-tender and non-distended. No rebound or guarding. No HSM or masses noted. Msk:  Symmetrical without gross deformities. Normal posture. Extremities:   Without edema. Neurologic:  Alert and  oriented x4 Psych:  Normal mood and affect.    Assessment:     Plan:  ***   Ermalinda MemosKristen Briceyda Abdullah, PA-C [facility] 07/21/2022

## 2022-07-19 ENCOUNTER — Ambulatory Visit: Payer: Medicaid Other | Admitting: Gastroenterology

## 2022-07-21 ENCOUNTER — Ambulatory Visit: Payer: Medicaid Other | Admitting: Gastroenterology

## 2022-07-26 ENCOUNTER — Encounter: Payer: Self-pay | Admitting: Family Medicine

## 2022-07-26 DIAGNOSIS — H6993 Unspecified Eustachian tube disorder, bilateral: Secondary | ICD-10-CM

## 2022-08-03 ENCOUNTER — Other Ambulatory Visit: Payer: Self-pay | Admitting: Gastroenterology

## 2022-08-04 ENCOUNTER — Telehealth: Payer: Self-pay | Admitting: *Deleted

## 2022-08-04 ENCOUNTER — Other Ambulatory Visit: Payer: Self-pay | Admitting: Gastroenterology

## 2022-08-04 DIAGNOSIS — K5900 Constipation, unspecified: Secondary | ICD-10-CM

## 2022-08-04 MED ORDER — LUBIPROSTONE 8 MCG PO CAPS: ORAL_CAPSULE | ORAL | 5 refills | Status: DC

## 2022-08-04 NOTE — Telephone Encounter (Signed)
Rx sent 

## 2022-08-04 NOTE — Telephone Encounter (Signed)
Received refill request for Amitiza. Pt last OV 11/25/2021

## 2022-08-06 NOTE — Progress Notes (Unsigned)
Referring Provider: Tommie Sams, DO Primary Care Physician:  Tommie Sams, DO Primary GI Physician: Dr. Jena Gauss  Chief Complaint  Patient presents with   Follow-up    Follow up. No problems     HPI:   Destiny Manning is a 52 y.o. female with history of esophageal dysphagia s/p empiric dilations, GERD, chronic constipation, adenomatous colon polyps currently due for surveillance, presenting today for follow-up.   Last seen via virtual visit 11/25/2021.  GERD not adequately managed on pantoprazole 40 mg daily.  Had also tried taking omeprazole 20 mg at bedtime but did not notice much improvement.  She is having daily GERD symptoms.  Intermittent nausea, single episode of vomiting.  She is being careful with her diet.  Denied NSAIDs.  Previously on Prilosec, Nexium, and most recently Dexilant prior to trial of pantoprazole.  Also continued with intermittent dysphagia without significant change, no real improvement after dilation in 2022, which she preferred to monitor.  Plan to increase pantoprazole to 40 mg twice daily.   Today:    GERD:  Well-controlled on pantoprazole 40 mg twice daily. Can tell it when she misses a dose. Little nausea if having breakthrough. Ues Zofran PRN, but not very frequently.  Dysphagia:  Nothing significant. Eats small bites and does well.    Constipation: Daily BMs. Soft. No brbpr or melena. Taking Amitiza BID.      Past Medical History:  Diagnosis Date   Asthma dx 06/04/07   ventolin, singulair   Blood transfusion without reported diagnosis    CHF (congestive heart failure) (HCC) 03/19/2008   a. 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 normalized by repeat imaging and at 55-60% in 04/2019,   DCM (dilated cardiomyopathy) (HCC) dx 06/06/07   EF 10-15%   Degeneration of spine    DJD (degenerative joint disease), lumbar 11/16/2016   chronic lumbar pain   GERD (gastroesophageal reflux disease)    takes Nexium Daily   Goiter  09/09/2020   Hyperlipidemia, unspecified 09/21/2021   Hypertension    Sciatica 09/21/2021   Seasonal allergies    takes zyrtec    Past Surgical History:  Procedure Laterality Date   ABDOMINAL HYSTERECTOMY     fibroids; partial- ovaries intact   BIOPSY  02/17/2021   Procedure: BIOPSY;  Surgeon: Corbin Ade, MD;  Location: [facility];  Service: Endoscopy;;   CERVICAL SPINE SURGERY  2002   [facility], ruptured disc   CHOLECYSTECTOMY  2001   laparoscopic, [facility]   COLONOSCOPY WITH PROPOFOL N/A 07/12/2017   tubular adenoma (10 mm polyp in cecum), segmental biopsies negative. 5 year surveillance   ESOPHAGOGASTRODUODENOSCOPY (EGD) WITH ESOPHAGEAL DILATION N/A 09/05/2012   RUE:AVWUJ hiatal hernia s/p esophageal dilation with [address].    ESOPHAGOGASTRODUODENOSCOPY (EGD) WITH PROPOFOL N/A 07/12/2017   normal esophagus s/p dilation, normal duodenal bulb and second portion of duodenum   ESOPHAGOGASTRODUODENOSCOPY (EGD) WITH PROPOFOL N/A 02/17/2021   Surgeon: Corbin Ade, MD;   Normal esophagus s/p dilation, normal stomach and examined duodenum.   LAPAROSCOPIC BILATERAL SALPINGO OOPHERECTOMY Bilateral 12/15/2020   Procedure: LAPAROSCOPIC BILATERAL SALPINGO OOPHORECTOMY;  Surgeon: Lazaro Arms, MD;  Location: [facility];  Service: Gynecology;  Laterality: Bilateral;   MALONEY DILATION N/A 07/12/2017   Procedure: Elease Hashimoto DILATION;  Surgeon: Corbin Ade, MD;  Location: [facility];  Service: Endoscopy;  Laterality: N/A;   MALONEY DILATION N/A 02/17/2021   Procedure: Elease Hashimoto DILATION;  Surgeon: Corbin Ade,  MD;  Location: [facility];  Service: Endoscopy;  Laterality: N/A;   POLYPECTOMY  07/12/2017   Procedure: POLYPECTOMY;  Surgeon: Corbin Ade, MD;  Location: [facility];  Service: Endoscopy;;  Cecal polyp (HS)   SPINE SURGERY     TUBAL LIGATION     VAGINAL HYSTERECTOMY  10/19/2010   Procedure: HYSTERECTOMY VAGINAL;  Surgeon: Lazaro Arms, MD;  Location: [facility];  Service:  Gynecology;  Laterality: N/A;    Current Outpatient Medications  Medication Sig Dispense Refill   albuterol (PROVENTIL) (2.5 MG/3ML) 0.083% nebulizer solution Take 3 mLs (2.5 mg total) by nebulization every 6 (six) hours as needed for wheezing or shortness of breath. 75 mL 0   atorvastatin (LIPITOR) 10 MG tablet Take 1 tablet (10 mg total) by mouth daily. 90 tablet 3   baclofen (LIORESAL) 10 MG tablet TAKE 1 TABLET BY MOUTH WITH FOOD OR MILK 3 TIMES DAILY AS NEEDED. 90 tablet 3   carvedilol (COREG) 25 MG tablet TAKE ONE TABLET BY MOUTH 2 TIMES A DAY 60 tablet 6   cetirizine-pseudoephedrine (ZYRTEC-D) 5-120 MG tablet Take 1 tablet by mouth 2 (two) times daily. 14 tablet 0   chlorthalidone (HYGROTON) 25 MG tablet Take 0.5 tablets (12.5 mg total) by mouth daily. 45 tablet 3   estradiol (ESTRACE) 2 MG tablet TAKE 1 TABLET BY MOUTH ONCE A DAY. 30 tablet 11   fluticasone (FLONASE) 50 MCG/ACT nasal spray Place 1 spray into both nostrils daily. 16 g 3   fluticasone-salmeterol (ADVAIR DISKUS) 500-50 MCG/ACT AEPB Inhale 1 puff into the lungs in the morning and at bedtime. 60 each 6   gabapentin (NEURONTIN) 800 MG tablet Take 1 tablet (800 mg total) by mouth 3 (three) times daily. 270 tablet 1   losartan (COZAAR) 50 MG tablet Take 1 tablet (50 mg total) by mouth daily. 90 tablet 3   Minoxidil (MINOXIDIL FOR WOMEN) 5 % FOAM Apply 1/2 capful to scalp/hair once daily. 60 g 3   montelukast (SINGULAIR) 10 MG tablet Take 1 tablet (10 mg total) by mouth daily. 90 tablet 1   potassium chloride SA (KLOR-CON M) 20 MEQ tablet Take 1 tablet (20 mEq total) by mouth daily. 90 tablet 3   SUMAtriptan (IMITREX) 50 MG tablet Take 1 tablet (50 mg total) by mouth 2 (two) times daily as needed for migraine. May repeat in 2 hours if headache persists or recurs. 20 tablet 2   lubiprostone (AMITIZA) 8 MCG capsule TAKE (1) CAPSULE BY MOUTH TWICE DAILY. 180 capsule 3   ondansetron (ZOFRAN) 4 MG tablet Take 1 tablet (4 mg total) by  mouth every 8 (eight) hours as needed for nausea or vomiting. 30 tablet 1   pantoprazole (PROTONIX) 40 MG tablet Take 1 tablet (40 mg total) by mouth 2 (two) times daily before a meal. 180 tablet 3   No current facility-administered medications for this visit.    Allergies as of 08/09/2022   (No Known Allergies)    Family History  Problem Relation Age of Onset   Diabetes Mother    Hypertension Mother    Congestive Heart Failure Father 104   Arthritis Father    Dementia Maternal Grandmother    Congestive Heart Failure Paternal Grandmother    Congestive Heart Failure Paternal Grandfather    Cancer Maternal Uncle    Anesthesia problems Neg Hx    Hypotension Neg Hx    Pseudochol deficiency Neg Hx    Malignant hyperthermia Neg Hx  Colon cancer Neg Hx    Colon polyps Neg Hx     Social History   Socioeconomic History   Marital status: Single    Spouse name: Not on file   Number of children: 2   Years of education: 16   Highest education level: Not on file  Occupational History   Occupation: [employer]    Comment: Comptroller- works with IVC patients  Tobacco Use   Smoking status: Former    Packs/day: 0.25    Years: 4.00    Additional pack years: 0.00    Total pack years: 1.00    Types: Cigarettes    Quit date: 03/14/2009    Years since quitting: 13.4   Smokeless tobacco: Never  Vaping Use   Vaping Use: Never used  Substance and Sexual Activity   Alcohol use: No   Drug use: No   Sexual activity: Not Currently    Birth control/protection: Surgical    Comment: hyst  Other Topics Concern   Not on file  Social History Narrative   Comptroller at 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      Lives with 2 children- age 41 and 51; she homeschools them   Social Determinants of Health   Financial Resource Strain: Low Risk  (01/04/2022)   Overall Financial Resource Strain (CARDIA)    Difficulty of Paying Living Expenses: Not hard at all  Food Insecurity: No Food  Insecurity (01/04/2022)   Hunger Vital Sign    Worried About Running Out of Food in the Last Year: Never true    Ran Out of Food in the Last Year: Never true  Transportation Needs: No Transportation Needs (01/04/2022)   PRAPARE - Administrator, Civil Service (Medical): No    Lack of Transportation (Non-Medical): No  Physical Activity: Inactive (01/04/2022)   Exercise Vital Sign    Days of Exercise per Week: 0 days    Minutes of Exercise per Session: 0 min  Stress: No Stress Concern Present (01/04/2022)   [employer] of Occupational Health - Occupational Stress Questionnaire    Feeling of Stress : Not at all  Social Connections: Moderately Isolated (01/04/2022)   Social Connection and Isolation Panel [NHANES]    Frequency of Communication with Friends and Family: More than three times a week    Frequency of Social Gatherings with Friends and Family: Once a week    Attends Religious Services: 1 to 4 times per year    Active Member of [employer] or Organizations: No    Attends Banker Meetings: Never    Marital Status: Never married    Review of Systems: Gen: Denies fever, chills, cold or flulike symptoms, presyncope, syncope. CV: Denies chest pain, palpitations. Resp: Denies dyspnea, cough. GI: See HPI Heme: See HPI  Physical Exam: BP 109/70 (BP Location: Left Arm, Patient Position: Sitting, Cuff Size: Normal)   Pulse 87   Temp 97.9 F (36.6 C) (Temporal)   Ht 5\' 3"  (1.6 m)   Wt 141 lb (64 kg)   LMP 07/04/2010   SpO2 97%   BMI 24.98 kg/m  General:   Alert and oriented. No distress noted. Pleasant and cooperative.  Head:  Normocephalic and atraumatic. Eyes:  Conjuctiva clear without scleral icterus. Heart:  S1, S2 present without murmurs appreciated. Lungs:  Clear to auscultation bilaterally. No wheezes, rales, or rhonchi. No distress.  Abdomen:  +BS, soft, non-tender and non-distended. No rebound or guarding. No HSM or masses noted. Msk:  Symmetrical without gross deformities. Normal posture. Extremities:  Without edema. Neurologic:  Alert and  oriented x4 Psych:  Normal mood and affect.    Assessment:  52 year old female with history of asthma, CHF, HTN, HLD, esophageal dysphagia s/p empiric dilations, GERD, chronic constipation, adenomatous colon polyps, presenting today for routine follow-up.  GERD: Well-controlled on pantoprazole 40 mg twice daily.  Occasional breakthrough with associated nausea for which she takes Zofran.  Dysphagia: Doing well with eating small bites and chewing well.  Chronic constipation: Well-controlled on Amitiza 8 mcg twice daily.  History of adenomatous colon polyps: Due for surveillance.  Last colonoscopy in April 2019 with 10 mm tubular adenoma in the cecum removed.  Recommended 5-year surveillance.  Plan:  Proceed with colonoscopy with propofol by Dr. Jena Gauss in near future. The risks, benefits, and alternatives have been discussed with the patient in detail. The patient states understanding and desires to proceed.  ASA 3 Continue pantoprazole 40 mg twice daily. Use Tums or famotidine as needed for breakthrough heartburn. Reinforced GERD diet/lifestyle.  Written instructions provided on AVS. Continue Zofran 4 mg as needed for nausea. Continue Amitiza 8 mcg twice daily. Follow-up in 1 year or sooner if needed.  Ermalinda Memos, PA-C [facility] 08/09/2022

## 2022-08-09 ENCOUNTER — Ambulatory Visit: Payer: Medicaid Other | Admitting: Gastroenterology

## 2022-08-09 ENCOUNTER — Encounter: Payer: Self-pay | Admitting: Gastroenterology

## 2022-08-09 DIAGNOSIS — K5900 Constipation, unspecified: Secondary | ICD-10-CM

## 2022-08-09 DIAGNOSIS — K219 Gastro-esophageal reflux disease without esophagitis: Secondary | ICD-10-CM

## 2022-08-09 DIAGNOSIS — R112 Nausea with vomiting, unspecified: Secondary | ICD-10-CM

## 2022-08-09 MED ORDER — ONDANSETRON HCL 4 MG PO TABS: 4.0000 mg | ORAL_TABLET | Freq: Three times a day (TID) | ORAL | 1 refills | Status: DC | PRN

## 2022-08-09 MED ORDER — PANTOPRAZOLE SODIUM 40 MG PO TBEC: 40.0000 mg | DELAYED_RELEASE_TABLET | Freq: Two times a day (BID) | ORAL | 3 refills | Status: DC

## 2022-08-09 MED ORDER — LUBIPROSTONE 8 MCG PO CAPS: ORAL_CAPSULE | ORAL | 3 refills | Status: DC

## 2022-08-09 NOTE — Patient Instructions (Addendum)
We will arrange for you to have a colonoscopy in the near future with Dr. Jena Gauss.  Continue pantoprazole 40 mg twice daily 30 minutes before breakfast and dinner for your reflux.  You may continue to use Zofran as needed for nausea.  Can also use Tums or over-the-counter famotidine for breakthrough heartburn if needed.  Follow a GERD diet:  Avoid fried, fatty, greasy, spicy, citrus foods. Avoid caffeine and carbonated beverages. Avoid chocolate. Try eating 4-6 small meals a day rather than 3 large meals. Do not eat within 3 hours of laying down. Prop head of bed up on wood or bricks to create a 6 inch incline.  Continue Amitiza 8 mcg twice daily with a meal for constipation.  We will follow-up with you in 1 year or sooner if needed.  It was great to see you again today!  I am glad you are doing well overall!  Ermalinda Memos, PA-C [facility]

## 2022-08-10 ENCOUNTER — Telehealth: Payer: Self-pay | Admitting: *Deleted

## 2022-08-10 NOTE — Telephone Encounter (Signed)
Called pt to schedule procedure and dates were given but she couldn't do them, she would like a call back once we get providers July schedule

## 2022-08-14 ENCOUNTER — Ambulatory Visit: Payer: Medicaid Other | Attending: Internal Medicine | Admitting: Internal Medicine

## 2022-08-14 ENCOUNTER — Encounter: Payer: Self-pay | Admitting: Internal Medicine

## 2022-08-14 VITALS — BP 118/68 | HR 80 | Ht 63.0 in | Wt 142.0 lb

## 2022-08-14 DIAGNOSIS — I428 Other cardiomyopathies: Secondary | ICD-10-CM | POA: Diagnosis not present

## 2022-08-14 NOTE — Patient Instructions (Signed)
Medication Instructions:  Your physician recommends that you continue on your current medications as directed. Please refer to the Current Medication list given to you today.  *If you need a refill on your cardiac medications before your next appointment, please call your pharmacy*   Lab Work:  If you have labs (blood work) drawn today and your tests are completely normal, you will receive your results only by: MyChart Message (if you have MyChart) OR A paper copy in the mail If you have any lab test that is abnormal or we need to change your treatment, we will call you to review the results.   Testing/Procedures: Your physician has requested that you have a cardiac MRI. Cardiac MRI uses a computer to create images of your heart as its beating, producing both still and moving pictures of your heart and major blood vessels. For further information please visit [email or web address]. Please follow the instruction sheet given to you today for more information.    Follow-Up: At [facility], you and your health needs are our priority.  As part of our continuing mission to provide you with exceptional heart care, we have created designated Provider Care Teams.  These Care Teams include your primary Cardiologist (physician) and Advanced Practice Providers (APPs -  Physician Assistants and Nurse Practitioners) who all work together to provide you with the care you need, when you need it.  We recommend signing up for the patient portal called "MyChart".  Sign up information is provided on this After Visit Summary.  MyChart is used to connect with patients for Virtual Visits (Telemedicine).  Patients are able to view lab/test results, encounter notes, upcoming appointments, etc.  Non-urgent messages can be sent to your provider as well.   To learn more about what you can do with MyChart, go to [email or web address].    Your next appointment:    December/ January   Provider:   Dietrich Pates,  MD    Other Instructions Thank you for choosing [facility]!

## 2022-08-14 NOTE — Progress Notes (Signed)
Cardiology Office Note   Date:  08/14/2022   ID:  Destiny Manning, DOB Jul 31, 1970, MRN [id number]  PCP:  Tommie Sams, DO  Cardiologist:   Dietrich Pates, MD    Pt presents for follow up of HTN and FMD   History of Present Illness: Destiny Manning is a 52 y.o. female with a history of a HTN, asthma and cardiomyopathy (Dx in 2009, ? Peripartum (delivered last child in Nov 2008) or idopathic)  LVEF at time she says was 12%  No cath done     Has had stress tests   Last  Echo in 2021 LVEF reported 55 to 60%; Gr I diastolic dysfunction.   Mild MS. Mid MR  Mod AI  CTA of neck in 07/2020 showed FMD of carotid arteries along with thyroid nodules    MRA of abd/pelvis in 09/2020 showed FMD; follow up in June  Worse on R side .  MRA of brain without evidence of stenosis or aneurysm  SHe was last seen in cardiology in March 2024 by Leda Gauze   Echo in April 2024 showed LVEF normal   Mod LVH    Mild restriction of mitral valve  Small pericardial effusion     Since seen she denies CP   Breathing is currently OK   Denies dizziness   No palpitations        Current Meds  Medication Sig   albuterol (PROVENTIL) (2.5 MG/3ML) 0.083% nebulizer solution Take 3 mLs (2.5 mg total) by nebulization every 6 (six) hours as needed for wheezing or shortness of breath.   atorvastatin (LIPITOR) 10 MG tablet Take 1 tablet (10 mg total) by mouth daily.   baclofen (LIORESAL) 10 MG tablet TAKE 1 TABLET BY MOUTH WITH FOOD OR MILK 3 TIMES DAILY AS NEEDED.   carvedilol (COREG) 25 MG tablet TAKE ONE TABLET BY MOUTH 2 TIMES A DAY   cetirizine-pseudoephedrine (ZYRTEC-D) 5-120 MG tablet Take 1 tablet by mouth 2 (two) times daily.   chlorthalidone (HYGROTON) 25 MG tablet Take 0.5 tablets (12.5 mg total) by mouth daily.   estradiol (ESTRACE) 2 MG tablet TAKE 1 TABLET BY MOUTH ONCE A DAY.   fluticasone (FLONASE) 50 MCG/ACT nasal spray Place 1 spray into both nostrils daily.   fluticasone-salmeterol (ADVAIR DISKUS) 500-50 MCG/ACT AEPB  Inhale 1 puff into the lungs in the morning and at bedtime.   gabapentin (NEURONTIN) 800 MG tablet Take 1 tablet (800 mg total) by mouth 3 (three) times daily.   losartan (COZAAR) 50 MG tablet Take 1 tablet (50 mg total) by mouth daily.   lubiprostone (AMITIZA) 8 MCG capsule TAKE (1) CAPSULE BY MOUTH TWICE DAILY.   Minoxidil (MINOXIDIL FOR WOMEN) 5 % FOAM Apply 1/2 capful to scalp/hair once daily.   montelukast (SINGULAIR) 10 MG tablet Take 1 tablet (10 mg total) by mouth daily.   ondansetron (ZOFRAN) 4 MG tablet Take 1 tablet (4 mg total) by mouth every 8 (eight) hours as needed for nausea or vomiting.   pantoprazole (PROTONIX) 40 MG tablet Take 1 tablet (40 mg total) by mouth 2 (two) times daily before a meal.   potassium chloride SA (KLOR-CON M) 20 MEQ tablet Take 1 tablet (20 mEq total) by mouth daily.   SUMAtriptan (IMITREX) 50 MG tablet Take 1 tablet (50 mg total) by mouth 2 (two) times daily as needed for migraine. May repeat in 2 hours if headache persists or recurs.     Allergies:   Patient has no  known allergies.   Past Medical History:  Diagnosis Date   Asthma dx 06/04/07   ventolin, singulair   Blood transfusion without reported diagnosis    CHF (congestive heart failure) (HCC) 03/19/2008   a. 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 normalized by repeat imaging and at 55-60% in 04/2019,   DCM (dilated cardiomyopathy) (HCC) dx 06/06/07   EF 10-15%   Degeneration of spine    DJD (degenerative joint disease), lumbar 11/16/2016   chronic lumbar pain   GERD (gastroesophageal reflux disease)    takes Nexium Daily   Goiter 09/09/2020   Hyperlipidemia, unspecified 09/21/2021   Hypertension    Sciatica 09/21/2021   Seasonal allergies    takes zyrtec    Past Surgical History:  Procedure Laterality Date   ABDOMINAL HYSTERECTOMY     fibroids; partial- ovaries intact   BIOPSY  02/17/2021   Procedure: BIOPSY;  Surgeon: Corbin Ade, MD;  Location[facility];  Service: Endoscopy;;   CERVICAL SPINE SURGERY  2002   [facility], ruptured disc   CHOLECYSTECTOMY  2001   laparoscopic, [facility]   COLONOSCOPY WITH PROPOFOL N/A 07/12/2017   tubular adenoma (10 mm polyp in cecum), segmental biopsies negative. 5 year surveillance   ESOPHAGOGASTRODUODENOSCOPY (EGD) WITH ESOPHAGEAL DILATION N/A 09/05/2012   [id number]:WRUEA hiatal hernia s/p esophageal dilation with 54 F Maloney.    ESOPHAGOGASTRODUODENOSCOPY (EGD) WITH PROPOFOL N/A 07/12/2017   normal esophagus s/p dilation, normal duodenal bulb and second portion of duodenum   ESOPHAGOGASTRODUODENOSCOPY (EGD) WITH PROPOFOL N/A 02/17/2021   Surgeon: Corbin Ade, MD;   Normal esophagus s/p dilation, normal stomach and examined duodenum.   LAPAROSCOPIC BILATERAL SALPINGO OOPHERECTOMY Bilateral 12/15/2020   Procedure: LAPAROSCOPIC BILATERAL SALPINGO OOPHORECTOMY;  Surgeon: Lazaro Arms, MD;  Location: [facility];  Service: Gynecology;  Laterality: Bilateral;   MALONEY DILATION N/A 07/12/2017   Procedure: Elease Hashimoto DILATION;  Surgeon: Corbin Ade, MD;  Location: [facility];  Service: Endoscopy;  Laterality: N/A;   MALONEY DILATION N/A 02/17/2021   Procedure: Elease Hashimoto DILATION;  Surgeon: Corbin Ade, MD;  Location[facility];  Service: Endoscopy;  Laterality: N/A;   POLYPECTOMY  07/12/2017   Procedure: POLYPECTOMY;  Surgeon: Corbin Ade, MD;  Location[facility];  Service: Endoscopy;;  Cecal polyp (HS)   SPINE SURGERY     TUBAL LIGATION     VAGINAL HYSTERECTOMY  10/19/2010   Procedure: HYSTERECTOMY VAGINAL;  Surgeon: Lazaro Arms, MD;  Location: [facility];  Service: Gynecology;  Laterality: N/A;     Social History:  The patient  reports that she quit smoking about 13 years ago. Her smoking use included cigarettes. She has a 1.00 pack-year smoking history. She has never used smokeless tobacco. She reports that she does not drink alcohol and does not use drugs.   Family History:  The patient's family  history includes Arthritis in her father; Cancer in her maternal uncle; Congestive Heart Failure in her paternal grandfather and paternal grandmother; Congestive Heart Failure (age of onset: 62) in her father; Dementia in her maternal grandmother; Diabetes in her mother; Hypertension in her mother.    ROS:  Please see the history of present illness. All other systems are reviewed and  Negative to the above problem except as noted.    PHYSICAL EXAM: VS:  BP 118/68   Pulse 80   Ht 5\' 3"  (1.6 m)   Wt 142 lb (64.4 kg)   LMP 07/04/2010  SpO2 98%   BMI 25.15 kg/m   GEN: Thin 52 yo  in no acute distress  HEENT: normal  Neck: no JVD  + bruits   Cardiac: RRR; No signficant murmurs   NO LE   edema  Respiratory:  clear to auscultation bilaterally  No wheezes or rales   GI: soft, nontender.  No hepatomegaly   EKG:  EKG is not ordered today.   Cardiac testing   Echo 05/24/22   IMPRESSIONS     1. Left ventricular ejection fraction, by estimation, is 60 to 65%. The  left ventricle has normal function. The left ventricle has no regional  wall motion abnormalities. There is moderate concentric left ventricular  hypertrophy. Left ventricular  diastolic parameters are indeterminate.   2. Right ventricular systolic function is normal. The right ventricular  size is normal. There is normal pulmonary artery systolic pressure. The  estimated right ventricular systolic pressure is 25.6 mmHg.   3. Left atrial size was severely dilated.   4. A small pericardial effusion is present, moderate posterior  collection. The pericardial effusion is circumferential. Some undulation  of the right atrial free wall without compression. No clear tamponade  physiology.   5. The mitral valve is abnormal, mildly thickened with restricted leaflet  motion. Moderate mitral valve regurgitation. Mild mitral stenosis. MVA by  planimetry 1.4 cm2.   6. The aortic valve is tricuspid. Aortic valve regurgitation is  moderate.  Aortic regurgitation PHT measures 371 msec. Aortic valve mean gradient  measures 10.0 mmHg.   Comparison(s): Prior images reviewed side by side. Pericardial effusion  has increased in size. Moderate LVH with valvular abnormalities as noted -  consider cardiac MRI to exclude infiltrative cardiomyopathy and further  evaluate valvular disease.   Carotids 09/19/21 IMPRESSION: Carotid duplex compatible with cerebrovascular FMD, worst on the right side. No significant atherosclerotic changes. Confirmatory testing might include CT angiogram neck, formal cervical-cerebral angiogram, or MR angiogram.  MRI   (abdomen/pelvis)  June 2022  MPRESSION: 1. 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 2. No evidence of aneurysm or dissection. 3. Scattered atherosclerotic plaque in the aorta, iliac and femoral arteries. Aortic Atherosclerosis (ICD10-I70.0). 4. Small penetrating atherosclerotic ulcer in the right common iliac artery. 5. No acute abnormality within the abdomen or pelvis.     Lipid Panel    Component Value Date/Time   CHOL 163 04/07/2022 1101   TRIG 162 (H) 04/07/2022 1101   HDL 54 04/07/2022 1101   CHOLHDL 3.0 04/07/2022 1101   LDLCALC 81 04/07/2022 1101      Wt Readings from Last 3 Encounters:  08/14/22 142 lb (64.4 kg)  08/09/22 141 lb (64 kg)  06/16/22 148 lb (67.1 kg)      ASSESSMENT AND PLAN:  1   Hx CHF   Pt w hx of HFrEF    Now it has normalized    Volume status is good  REview of echo she does have LVH   Given Hx I would recomm an MRI to evaluate myocardium further         2  Hx HTN   BP is well controlled     3  FMD  Signficant in neck and abdomen   PT without neuro or vascular complaints   Will need to be followed   4  ? CAD   In note from primary cardiologist Graciela Husbands   ?  CAD  Review records    No Hx of angina  5  HL   ON  atorvastatin   In Dec 2023 LDLD was 81, HDL 54  Trig 162  Watch carbs  6  Hx asthma   URI earlier this winter        F/U in 6 wks in clinic for eval of BP   Current medicines are reviewed at length with the patient today.  The patient does not have concerns regarding medicines.  Signed, Dietrich Pates, MD  08/14/2022 8:31 AM    [facility] [address] [place], [place], [place]  [address] Phone: [phone]; Fax: [phone]

## 2022-08-28 ENCOUNTER — Ambulatory Visit: Payer: Medicaid Other | Admitting: Internal Medicine

## 2022-08-29 ENCOUNTER — Ambulatory Visit: Payer: Medicaid Other | Admitting: Family Medicine

## 2022-09-07 ENCOUNTER — Encounter: Payer: Self-pay | Admitting: *Deleted

## 2022-09-07 ENCOUNTER — Other Ambulatory Visit: Payer: Self-pay | Admitting: *Deleted

## 2022-09-07 MED ORDER — CLENPIQ 10-3.5-12 MG-GM -GM/175ML PO SOLN: 1.0000 | ORAL | 0 refills | Status: DC

## 2022-09-07 NOTE — Telephone Encounter (Signed)
Pt has been scheduled for 10/18/22. Instructions mailed and prep sent to the pharmacy

## 2022-09-11 ENCOUNTER — Encounter: Payer: Self-pay | Admitting: *Deleted

## 2022-09-13 ENCOUNTER — Telehealth: Payer: Self-pay | Admitting: *Deleted

## 2022-09-13 NOTE — Telephone Encounter (Signed)
Informed pt that provider would be out on 10/18/22, the date her procedure is scheduled. Offered her to stay on same day but with Dr.Carver. Pt states she is fine with staying on same day with Dr.Carver.

## 2022-09-14 ENCOUNTER — Telehealth: Payer: Self-pay | Admitting: Internal Medicine

## 2022-09-14 DIAGNOSIS — I428 Other cardiomyopathies: Secondary | ICD-10-CM

## 2022-09-14 NOTE — Telephone Encounter (Signed)
Pt called in, she is asking if her lab orders from Dr. Tenny Craw can be changed from [facility]

## 2022-09-14 NOTE — Telephone Encounter (Signed)
Labs changed from [facility] to [facility] per pt request.   Patient notified and verbalized understanding

## 2022-09-16 LAB — HEMOGLOBIN AND HEMATOCRIT, BLOOD
Hematocrit: 47.5 % — ABNORMAL HIGH (ref 34.0–46.6)
Hemoglobin: 16.3 g/dL — ABNORMAL HIGH (ref 11.1–15.9)

## 2022-10-09 ENCOUNTER — Ambulatory Visit: Payer: Medicaid Other | Admitting: Family Medicine

## 2022-10-13 ENCOUNTER — Other Ambulatory Visit: Payer: Self-pay

## 2022-10-13 ENCOUNTER — Encounter (HOSPITAL_COMMUNITY): Payer: Self-pay

## 2022-10-16 ENCOUNTER — Encounter (HOSPITAL_COMMUNITY)
Admission: RE | Admit: 2022-10-16 | Discharge: 2022-10-16 | Disposition: A | Discharge status: Home / Self Care | Payer: Medicaid Other | Source: Ambulatory Visit | Attending: Internal Medicine | Admitting: Internal Medicine

## 2022-10-18 ENCOUNTER — Ambulatory Visit (HOSPITAL_BASED_OUTPATIENT_CLINIC_OR_DEPARTMENT_OTHER): Payer: Medicaid Other | Admitting: Certified Registered Nurse Anesthetist

## 2022-10-18 ENCOUNTER — Ambulatory Visit (HOSPITAL_COMMUNITY): Payer: Medicaid Other | Admitting: Certified Registered Nurse Anesthetist

## 2022-10-18 ENCOUNTER — Ambulatory Visit (HOSPITAL_COMMUNITY)
Admission: RE | Admit: 2022-10-18 | Discharge: 2022-10-18 | Disposition: A | Discharge status: Home / Self Care | Payer: Medicaid Other | Attending: Internal Medicine | Admitting: Internal Medicine

## 2022-10-18 ENCOUNTER — Encounter (HOSPITAL_COMMUNITY)
Admission: RE | Disposition: A | Discharge status: Home / Self Care | Payer: Self-pay | Source: Home / Self Care | Attending: Internal Medicine

## 2022-10-18 ENCOUNTER — Encounter (HOSPITAL_COMMUNITY): Payer: Self-pay

## 2022-10-18 DIAGNOSIS — Z86010 Personal history of colonic polyps: Secondary | ICD-10-CM | POA: Insufficient documentation

## 2022-10-18 DIAGNOSIS — D124 Benign neoplasm of descending colon: Secondary | ICD-10-CM | POA: Diagnosis not present

## 2022-10-18 DIAGNOSIS — Z79899 Other long term (current) drug therapy: Secondary | ICD-10-CM | POA: Insufficient documentation

## 2022-10-18 DIAGNOSIS — J45909 Unspecified asthma, uncomplicated: Secondary | ICD-10-CM | POA: Insufficient documentation

## 2022-10-18 DIAGNOSIS — I509 Heart failure, unspecified: Secondary | ICD-10-CM | POA: Insufficient documentation

## 2022-10-18 DIAGNOSIS — K219 Gastro-esophageal reflux disease without esophagitis: Secondary | ICD-10-CM | POA: Diagnosis not present

## 2022-10-18 DIAGNOSIS — D126 Benign neoplasm of colon, unspecified: Secondary | ICD-10-CM | POA: Diagnosis not present

## 2022-10-18 DIAGNOSIS — Z7951 Long term (current) use of inhaled steroids: Secondary | ICD-10-CM | POA: Diagnosis not present

## 2022-10-18 DIAGNOSIS — K648 Other hemorrhoids: Secondary | ICD-10-CM | POA: Diagnosis not present

## 2022-10-18 DIAGNOSIS — I110 Hypertensive heart disease with heart failure: Secondary | ICD-10-CM | POA: Diagnosis not present

## 2022-10-18 DIAGNOSIS — I420 Dilated cardiomyopathy: Secondary | ICD-10-CM | POA: Diagnosis not present

## 2022-10-18 DIAGNOSIS — Z1211 Encounter for screening for malignant neoplasm of colon: Secondary | ICD-10-CM | POA: Insufficient documentation

## 2022-10-18 DIAGNOSIS — E785 Hyperlipidemia, unspecified: Secondary | ICD-10-CM | POA: Insufficient documentation

## 2022-10-18 DIAGNOSIS — Z87891 Personal history of nicotine dependence: Secondary | ICD-10-CM | POA: Diagnosis not present

## 2022-10-18 HISTORY — PX: COLONOSCOPY WITH PROPOFOL: SHX5780

## 2022-10-18 HISTORY — PX: POLYPECTOMY: SHX5525

## 2022-10-18 SURGERY — COLONOSCOPY WITH PROPOFOL
Anesthesia: General

## 2022-10-18 MED ORDER — PROPOFOL 10 MG/ML IV BOLUS
INTRAVENOUS | Status: DC | PRN
  Administered 2022-10-18: 60 mg via INTRAVENOUS

## 2022-10-18 MED ORDER — PROPOFOL 500 MG/50ML IV EMUL
INTRAVENOUS | Status: DC | PRN
  Administered 2022-10-18: 150 ug/kg/min via INTRAVENOUS

## 2022-10-18 MED ORDER — PROPOFOL 500 MG/50ML IV EMUL
INTRAVENOUS | Status: AC
  Filled 2022-10-18: qty 50

## 2022-10-18 MED ORDER — LACTATED RINGERS IV SOLN: INTRAVENOUS | Status: DC

## 2022-10-18 NOTE — Anesthesia Postprocedure Evaluation (Signed)
Anesthesia Post Note  Patient: Destiny Manning  Procedure(s) Performed: COLONOSCOPY WITH PROPOFOL POLYPECTOMY  Patient location during evaluation: Phase II Anesthesia Type: General Level of consciousness: awake and alert and oriented Pain management: pain level controlled Vital Signs Assessment: post-procedure vital signs reviewed and stable Respiratory status: spontaneous breathing, nonlabored ventilation and respiratory function stable Cardiovascular status: blood pressure returned to baseline and stable Postop Assessment: no apparent nausea or vomiting Anesthetic complications: no  No notable events documented.   Last Vitals:  Vitals:   10/18/22 0643 10/18/22 0813  BP: 118/60 (!) 97/53  Pulse: 67 71  Resp: 14 17  Temp: 36.7 C 36.6 C  SpO2: 100% 100%    Last Pain:  Vitals:   10/18/22 0813  TempSrc: Oral  PainSc: 0-No pain                 Mialani Reicks C Ryen Heitmeyer

## 2022-10-18 NOTE — Anesthesia Preprocedure Evaluation (Signed)
Anesthesia Evaluation  Patient identified by MRN, date of birth, ID band Patient awake    Reviewed: Allergy & Precautions, H&P , NPO status , Patient's Chart, lab work & pertinent test results, reviewed documented beta blocker date and time   Airway Mallampati: II  TM Distance: >3 FB Neck ROM: Full    Dental  (+) Dental Advisory Given, Missing   Pulmonary asthma , former smoker   Pulmonary exam normal breath sounds clear to auscultation       Cardiovascular Exercise Tolerance: Good hypertension, Pt. on medications and Pt. on home beta blockers +CHF  Normal cardiovascular exam Rhythm:Regular Rate:Normal  1. Limited Echo to evaluate for pericardial effusion   2. Left ventricular ejection fraction, by estimation, is 60 to 65%. The  left ventricle has normal function. The left ventricle has no regional  wall motion abnormalities. There is moderate left ventricular hypertrophy.  Left ventricular diastolic function   could not be evaluated.   3. Anterior mitral valve leaflet appears to have diastolic doming  appearance.   4. A small pericardial effusion is present. The pericardial effusion is  circumferential. There is no evidence of cardiac tamponade.   5. The inferior vena cava is normal in size with greater than 50%  respiratory variability, suggesting right atrial pressure of 3 mmHg.     Neuro/Psych  Headaches  Neuromuscular disease  negative psych ROS   GI/Hepatic Neg liver ROS,GERD  Medicated and Controlled,,  Endo/Other  negative endocrine ROS    Renal/GU negative Renal ROS  negative genitourinary   Musculoskeletal  (+) Arthritis , Osteoarthritis,    Abdominal   Peds negative pediatric ROS (+)  Hematology negative hematology ROS (+)   Anesthesia Other Findings   Reproductive/Obstetrics negative OB ROS                             Anesthesia Physical Anesthesia Plan  ASA:  2  Anesthesia Plan: General   Post-op Pain Management: Minimal or no pain anticipated   Induction: Intravenous  PONV Risk Score and Plan: 1 and Propofol infusion  Airway Management Planned: Nasal Cannula and Natural Airway  Additional Equipment:   Intra-op Plan:   Post-operative Plan:   Informed Consent: I have reviewed the patients History and Physical, chart, labs and discussed the procedure including the risks, benefits and alternatives for the proposed anesthesia with the patient or authorized representative who has indicated his/her understanding and acceptance.     Dental advisory given  Plan Discussed with: CRNA and Surgeon  Anesthesia Plan Comments:         Anesthesia Quick Evaluation

## 2022-10-18 NOTE — Transfer of Care (Signed)
Immediate Anesthesia Transfer of Care Note  Patient: Destiny Manning  Procedure(s) Performed: COLONOSCOPY WITH PROPOFOL  Patient Location: Short Stay  Anesthesia Type:General  Level of Consciousness: awake, alert , and oriented  Airway & Oxygen Therapy: Patient Spontanous Breathing  Post-op Assessment: Report given to RN, Post -op Vital signs reviewed and stable, Patient moving all extremities X 4, and Patient able to stick tongue midline  Post vital signs: Reviewed  Last Vitals:  Vitals Value Taken Time  BP 105/46   Temp 98.6   Pulse 75   Resp 17   SpO2 100     Last Pain:  Vitals:   10/18/22 0742  TempSrc:   PainSc: 0-No pain         Complications: No notable events documented.

## 2022-10-18 NOTE — Op Note (Signed)
[facility] Patient Name: Destiny Manning Procedure Date: 10/18/2022 7:09 AM MRN: [id number] Date of Birth: 1970/06/26 Attending MD: Hennie Duos. Marletta Lor , [place], [phone] CSN: [id number] Age: 52 Admit Type: Outpatient Procedure:                Colonoscopy Indications:              Surveillance: Personal history of adenomatous                            polyps on last colonoscopy 5 years ago Providers:                Hennie Duos. Marletta Lor, DO, Tammy Vaught, RN, Zena Amos Referring MD:              Medicines:                See the Anesthesia note for documentation of the                            administered medications Complications:            No immediate complications. Estimated Blood Loss:     Estimated blood loss was minimal. Procedure:                Pre-Anesthesia Assessment:                           - The anesthesia plan was to use monitored                            anesthesia care (MAC).                           After obtaining informed consent, the colonoscope                            was passed under direct vision. Throughout the                            procedure, the patient's blood pressure, pulse, and                            oxygen saturations were monitored continuously. The                            [id number]-HQ190L ([id number]) scope was introduced through                            the anus and advanced to the the cecum, identified                            by appendiceal orifice and ileocecal valve. The                            colonoscopy was performed without difficulty.  The                            patient tolerated the procedure well. The quality                            of the bowel preparation was evaluated using the                            BBPS [facility] Bowel Preparation Scale) with scores                            of: Right Colon = 3, Transverse Colon = 3 and Left                            Colon = 3 (entire  mucosa seen well with no residual                            staining, small fragments of stool or opaque                            liquid). The total BBPS score equals 9. Scope In: 7:49:51 AM Scope Out: 8:06:28 AM Scope Withdrawal Time: 0 hours 12 minutes 41 seconds  Total Procedure Duration: 0 hours 16 minutes 37 seconds  Findings:      Non-bleeding internal hemorrhoids were found during endoscopy.      A 6 mm polyp was found in the descending colon. The polyp was sessile.       The polyp was removed with a cold snare. Resection and retrieval were       complete.      The exam was otherwise without abnormality. Impression:               - Non-bleeding internal hemorrhoids.                           - One 6 mm polyp in the descending colon, removed                            with a cold snare. Resected and retrieved.                           - The examination was otherwise normal. Moderate Sedation:      Per Anesthesia Care Recommendation:           - Patient has a contact number available for                            emergencies. The signs and symptoms of potential                            delayed complications were discussed with the                            patient. Return to normal activities tomorrow.  Written discharge instructions were provided to the                            patient.                           - Resume previous diet.                           - Continue present medications.                           - Await pathology results.                           - Repeat colonoscopy in 5 years for surveillance.                           - Return to GI clinic in 1 year. Procedure Code(s):        --- Professional ---                           [phone], Colonoscopy, flexible; with removal of                            tumor(s), polyp(s), or other lesion(s) by snare                            technique Diagnosis Code(s):        ---  Professional ---                           Z86.010, Personal history of colonic polyps                           D12.4, Benign neoplasm of descending colon                           K64.8, Other hemorrhoids CPT copyright 2022 American Medical Association. All rights reserved. The codes documented in this report are preliminary and upon coder review may  be revised to meet current compliance requirements. Hennie Duos. Marletta Lor, DO Hennie Duos. Seann Genther, DO 10/18/2022 8:10:16 AM This report has been signed electronically. Number of Addenda: 0

## 2022-10-18 NOTE — H&P (Signed)
Primary Care Physician:  Tommie Sams, DO Primary Gastroenterologist:  Dr. Marletta Lor  Pre-Procedure History & Physical: HPI:  Destiny Manning is a 52 y.o. female is here for a colonoscopy to be performed for surveillance purposes, personal history of adenomatous colon polyps in 2019  Past Medical History:  Diagnosis Date   Asthma dx 06/04/07   ventolin, singulair   Blood transfusion without reported diagnosis    CHF (congestive heart failure) (HCC) 03/19/2008   a. 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 normalized by repeat imaging and at 55-60% in 04/2019,   DCM (dilated cardiomyopathy) (HCC) dx 06/06/07   EF 10-15%   Degeneration of spine    DJD (degenerative joint disease), lumbar 11/16/2016   chronic lumbar pain   GERD (gastroesophageal reflux disease)    takes Nexium Daily   Goiter 09/09/2020   Hyperlipidemia, unspecified 09/21/2021   Hypertension    Sciatica 09/21/2021   Seasonal allergies    takes zyrtec    Past Surgical History:  Procedure Laterality Date   ABDOMINAL HYSTERECTOMY     fibroids; partial- ovaries intact   BIOPSY  02/17/2021   Procedure: BIOPSY;  Surgeon: Corbin Ade, MD;  [facility];  Service: Endoscopy;;   CERVICAL SPINE SURGERY  2002   [facility], ruptured disc   CHOLECYSTECTOMY  2001   laparoscopic, [facility]   COLONOSCOPY WITH PROPOFOL N/A 07/12/2017   tubular adenoma (10 mm polyp in cecum), segmental biopsies negative. 5 year surveillance   ESOPHAGOGASTRODUODENOSCOPY (EGD) WITH ESOPHAGEAL DILATION N/A 09/05/2012   [id number] hiatal hernia s/p esophageal dilation with [address] F [address].    ESOPHAGOGASTRODUODENOSCOPY (EGD) WITH PROPOFOL N/A 07/12/2017   normal esophagus s/p dilation, normal duodenal bulb and second portion of duodenum   ESOPHAGOGASTRODUODENOSCOPY (EGD) WITH PROPOFOL N/A 02/17/2021   Surgeon: Corbin Ade, MD;   Normal esophagus s/p dilation, normal stomach and examined duodenum.   LAPAROSCOPIC BILATERAL SALPINGO  OOPHERECTOMY Bilateral 12/15/2020   Procedure: LAPAROSCOPIC BILATERAL SALPINGO OOPHORECTOMY;  Surgeon: Lazaro Arms, MD;  Location: [facility];  Service: Gynecology;  Laterality: Bilateral;   MALONEY DILATION N/A 07/12/2017   Procedure: Elease Hashimoto DILATION;  Surgeon: Corbin Ade, MD;  Location: [facility];  Service: Endoscopy;  Laterality: N/A;   MALONEY DILATION N/A 02/17/2021   Procedure: Elease Hashimoto DILATION;  Surgeon: Corbin Ade, MD;  [facility]: [facility];  Service: Endoscopy;  Laterality: N/A;   POLYPECTOMY  07/12/2017   Procedure: POLYPECTOMY;  Surgeon: Corbin Ade, MD;  [facility]: [facility];  Service: Endoscopy;;  Cecal polyp (HS)   SPINE SURGERY     TUBAL LIGATION     VAGINAL HYSTERECTOMY  10/19/2010   Procedure: HYSTERECTOMY VAGINAL;  Surgeon: Lazaro Arms, MD;  Location: [facility];  Service: Gynecology;  Laterality: N/A;    Prior to Admission medications   Medication Sig Start Date End Date Taking? Authorizing Provider  atorvastatin (LIPITOR) 10 MG tablet Take 1 tablet (10 mg total) by mouth daily. 01/07/22  Yes [name], Alvino Chapel, FNP  baclofen (LIORESAL) 10 MG tablet TAKE 1 TABLET BY MOUTH WITH FOOD OR MILK 3 TIMES DAILY AS NEEDED. 06/28/22  Yes Cook, Jayce G, DO  Biotin 54098 MCG TABS Take 10,000 mcg by mouth daily.   Yes [provider]  carvedilol (COREG) 25 MG tablet TAKE ONE TABLET BY MOUTH 2 TIMES A DAY 05/30/22  Yes Pricilla Riffle, MD  cetirizine-pseudoephedrine (ZYRTEC-D) 5-120 MG tablet Take 1 tablet by mouth 2 (  two) times daily. Patient taking differently: Take 1 tablet by mouth 2 (two) times daily as needed for allergies. 06/19/22  Yes Cook, Jayce G, DO  chlorthalidone (HYGROTON) 25 MG tablet Take 0.5 tablets (12.5 mg total) by mouth daily. 05/23/22 05/12/24 Yes Dyann Kief, PA-C  DERMA-SMOOTHE/FS SCALP 0.01 % OIL Apply 1 Application topically at bedtime. 09/19/22  Yes [provider]  estradiol (ESTRACE) 2 MG tablet TAKE 1 TABLET BY MOUTH ONCE  A DAY. 01/04/22  Yes Lazaro Arms, MD  fluticasone (FLONASE) 50 MCG/ACT nasal spray Place 1 spray into both nostrils daily. Patient taking differently: Place 1 spray into both nostrils daily as needed for allergies. 04/07/22  Yes Cook, Jayce G, DO  gabapentin (NEURONTIN) 800 MG tablet Take 1 tablet (800 mg total) by mouth 3 (three) times daily. 04/07/22  Yes Cook, Jayce G, DO  losartan (COZAAR) 50 MG tablet Take 1 tablet (50 mg total) by mouth daily. 05/29/22 05/24/23 Yes Dyann Kief, PA-C  lubiprostone (AMITIZA) 8 MCG capsule TAKE (1) CAPSULE BY MOUTH TWICE DAILY. 08/09/22  Yes Letta Median, PA-C  Minoxidil (MINOXIDIL FOR WOMEN) 5 % FOAM Apply 1/2 capful to scalp/hair once daily. 05/11/22  Yes Cook, Jayce G, DO  montelukast (SINGULAIR) 10 MG tablet Take 1 tablet (10 mg total) by mouth daily. 04/07/22  Yes Cook, Jayce G, DO  ondansetron (ZOFRAN) 4 MG tablet Take 1 tablet (4 mg total) by mouth every 8 (eight) hours as needed for nausea or vomiting. 08/09/22  Yes Letta Median, PA-C  pantoprazole (PROTONIX) 40 MG tablet Take 1 tablet (40 mg total) by mouth 2 (two) times daily before a meal. 08/09/22  Yes Clearance Coots, Kristen S, PA-C  potassium chloride SA (KLOR-CON M) 20 MEQ tablet Take 1 tablet (20 mEq total) by mouth daily. 05/23/22  Yes Dyann Kief, PA-C  Sod Picosulfate-Mag Ox-Cit Acd (CLENPIQ) 10-3.5-12 MG-GM -GM/175ML SOLN Take 1 kit by mouth as directed. 09/07/22  Yes Rourk, Gerrit Friends, MD  albuterol (PROVENTIL) (2.5 MG/3ML) 0.083% nebulizer solution Take 3 mLs (2.5 mg total) by nebulization every 6 (six) hours as needed for wheezing or shortness of breath. 05/26/22   Tommie Sams, DO  fluticasone-salmeterol (ADVAIR DISKUS) 500-50 MCG/ACT AEPB Inhale 1 puff into the lungs in the morning and at bedtime. Patient taking differently: Inhale 1 puff into the lungs 2 (two) times daily as needed (shortness of breath). 05/29/22   Tommie Sams, DO  SUMAtriptan (IMITREX) 50 MG tablet Take 1 tablet (50 mg  total) by mouth 2 (two) times daily as needed for migraine. May repeat in 2 hours if headache persists or recurs. 04/07/22   Tommie Sams, DO    Allergies as of 09/07/2022   (No Known Allergies)    Family History  Problem Relation Age of Onset   Diabetes Mother    Hypertension Mother    Congestive Heart Failure Father 60   Arthritis Father    Dementia Maternal Grandmother    Congestive Heart Failure Paternal Grandmother    Congestive Heart Failure Paternal Grandfather    Cancer Maternal Uncle    Anesthesia problems Neg Hx    Hypotension Neg Hx    Pseudochol deficiency Neg Hx    Malignant hyperthermia Neg Hx    Colon cancer Neg Hx    Colon polyps Neg Hx     Social History   Socioeconomic History   Marital status: Single    Spouse name: Not on file   Number  of children: 2   Years of education: 16   Highest education level: Not on file  Occupational History   Occupation: [employer]    Comment: Comptroller- works with IVC patients  Tobacco Use   Smoking status: Former    Packs/day: 0.25    Years: 4.00    Additional pack years: 0.00    Total pack years: 1.00    Types: Cigarettes    Quit date: 03/14/2009    Years since quitting: 13.6   Smokeless tobacco: Never  Vaping Use   Vaping Use: Never used  Substance and Sexual Activity   Alcohol use: No   Drug use: No   Sexual activity: Not Currently    Birth control/protection: Surgical    Comment: hyst  Other Topics Concern   Not on file  Social History Narrative   Comptroller at 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      Lives with 2 children- age 32 and 59; she homeschools them   Social Determinants of Health   Financial Resource Strain: Low Risk  (01/04/2022)   Overall Financial Resource Strain (CARDIA)    Difficulty of Paying Living Expenses: Not hard at all  Food Insecurity: No Food Insecurity (01/04/2022)   Hunger Vital Sign    Worried About Running Out of Food in the Last Year: Never true    Ran Out of  Food in the Last Year: Never true  Transportation Needs: No Transportation Needs (01/04/2022)   PRAPARE - Administrator, Civil Service (Medical): No    Lack of Transportation (Non-Medical): No  Physical Activity: Inactive (01/04/2022)   Exercise Vital Sign    Days of Exercise per Week: 0 days    Minutes of Exercise per Session: 0 min  Stress: No Stress Concern Present (01/04/2022)   [employer] of Occupational Health - Occupational Stress Questionnaire    Feeling of Stress : Not at all  Social Connections: Moderately Isolated (01/04/2022)   Social Connection and Isolation Panel [NHANES]    Frequency of Communication with Friends and Family: More than three times a week    Frequency of Social Gatherings with Friends and Family: Once a week    Attends Religious Services: 1 to 4 times per year    Active Member of Clubs or Organizations: No    Attends Banker Meetings: Never    Marital Status: Never married  Intimate Partner Violence: Not At Risk (01/04/2022)   Humiliation, Afraid, Rape, and Kick questionnaire    Fear of Current or Ex-Partner: No    Emotionally Abused: No    Physically Abused: No    Sexually Abused: No    Review of Systems: See HPI, otherwise negative ROS  Physical Exam: Vital signs in last 24 hours: Temp:  [98 F (36.7 C)] 98 F (36.7 C) (07/10 0643) Pulse Rate:  [67] 67 (07/10 0643) Resp:  [14] 14 (07/10 0643) BP: (118)/(60) 118/60 (07/10 0643) SpO2:  [100 %] 100 % (07/10 0643) Weight:  [65.7 kg] 65.7 kg (07/10 0643)   General:   Alert,  Well-developed, well-nourished, pleasant and cooperative in NAD Head:  Normocephalic and atraumatic. Eyes:  Sclera clear, no icterus.   Conjunctiva pink. Ears:  Normal auditory acuity. Nose:  No deformity, discharge,  or lesions. Msk:  Symmetrical without gross deformities. Normal posture. Extremities:  Without clubbing or edema. Neurologic:  Alert and  oriented x4;  grossly normal  neurologically. Skin:  Intact without significant lesions or rashes. Psych:  Alert and  cooperative. Normal mood and affect.  Impression/Plan: Destiny Manning is here for a colonoscopy to be performed for surveillance purposes, personal history of adenomatous colon polyps in 2019  The risks of the procedure including infection, bleed, or perforation as well as benefits, limitations, alternatives and imponderables have been reviewed with the patient. Questions have been answered. All parties agreeable.

## 2022-10-18 NOTE — Discharge Instructions (Signed)
  Colonoscopy Discharge Instructions  Read the instructions outlined below and refer to this sheet in the next few weeks. These discharge instructions provide you with general information on caring for yourself after you leave the hospital. Your doctor may also give you specific instructions. While your treatment has been planned according to the most current medical practices available, unavoidable complications occasionally occur.   ACTIVITY You may resume your regular activity, but move at a slower pace for the next 24 hours.  Take frequent rest periods for the next 24 hours.  Walking will help get rid of the air and reduce the bloated feeling in your belly (abdomen).  No driving for 24 hours (because of the medicine (anesthesia) used during the test).   Do not sign any important legal documents or operate any machinery for 24 hours (because of the anesthesia used during the test).  NUTRITION Drink plenty of fluids.  You may resume your normal diet as instructed by your doctor.  Begin with a light meal and progress to your normal diet. Heavy or fried foods are harder to digest and may make you feel sick to your stomach (nauseated).  Avoid alcoholic beverages for 24 hours or as instructed.  MEDICATIONS You may resume your normal medications unless your doctor tells you otherwise.  WHAT YOU CAN EXPECT TODAY Some feelings of bloating in the abdomen.  Passage of more gas than usual.  Spotting of blood in your stool or on the toilet paper.  IF YOU HAD POLYPS REMOVED DURING THE COLONOSCOPY: No aspirin products for 7 days or as instructed.  No alcohol for 7 days or as instructed.  Eat a soft diet for the next 24 hours.  FINDING OUT THE RESULTS OF YOUR TEST Not all test results are available during your visit. If your test results are not back during the visit, make an appointment with your caregiver to find out the results. Do not assume everything is normal if you have not heard from your  caregiver or the medical facility. It is important for you to follow up on all of your test results.  SEEK IMMEDIATE MEDICAL ATTENTION IF: You have more than a spotting of blood in your stool.  Your belly is swollen (abdominal distention).  You are nauseated or vomiting.  You have a temperature over 101.  You have abdominal pain or discomfort that is severe or gets worse throughout the day.   Your colonoscopy revealed 1 polyp(s) which I removed successfully. Await pathology results, my office will contact you. I recommend repeating colonoscopy in 5 years for surveillance purposes.   Follow up in GI office in 1 year  I hope you have a great rest of your week!  Hennie Duos. Marletta Lor, D.O. Gastroenterology and Hepatology [facility]

## 2022-10-19 LAB — SURGICAL PATHOLOGY

## 2022-10-23 ENCOUNTER — Ambulatory Visit (HOSPITAL_COMMUNITY)
Admission: RE | Admit: 2022-10-23 | Discharge: 2022-10-23 | Disposition: A | Discharge status: Home / Self Care | Payer: Medicaid Other | Source: Ambulatory Visit | Attending: "Endocrinology | Admitting: "Endocrinology

## 2022-10-23 DIAGNOSIS — E042 Nontoxic multinodular goiter: Secondary | ICD-10-CM | POA: Diagnosis not present

## 2022-10-24 ENCOUNTER — Encounter (HOSPITAL_COMMUNITY): Payer: Self-pay | Admitting: Internal Medicine

## 2022-10-30 ENCOUNTER — Other Ambulatory Visit: Payer: Self-pay | Admitting: *Deleted

## 2022-10-30 DIAGNOSIS — E042 Nontoxic multinodular goiter: Secondary | ICD-10-CM

## 2022-10-31 ENCOUNTER — Telehealth (HOSPITAL_COMMUNITY): Payer: Self-pay | Admitting: *Deleted

## 2022-10-31 NOTE — Telephone Encounter (Signed)
Attempted to call patient regarding upcoming cardiac CT appointment. °Left message on voicemail with name and callback number ° °Merle Prescott RN Navigator Cardiac Imaging °[facility] °[phone] Office °[phone] Cell ° °

## 2022-10-31 NOTE — Telephone Encounter (Signed)
Patient returning call about her upcoming cardiac imaging study; pt verbalizes understanding of appt date/time, parking situation and where to check in, and verified current allergies; name and call back number provided for further questions should they arise  Larey Brick RN Navigator Cardiac Imaging [facility] [phone] office [phone] cell  Patient denies metal or claustrophobia.

## 2022-11-01 ENCOUNTER — Ambulatory Visit (HOSPITAL_COMMUNITY)
Admission: RE | Admit: 2022-11-01 | Discharge: 2022-11-01 | Disposition: A | Discharge status: Home / Self Care | Payer: Medicaid Other | Source: Ambulatory Visit | Attending: Internal Medicine | Admitting: Internal Medicine

## 2022-11-01 ENCOUNTER — Other Ambulatory Visit: Payer: Self-pay | Admitting: Internal Medicine

## 2022-11-01 DIAGNOSIS — I428 Other cardiomyopathies: Secondary | ICD-10-CM

## 2022-11-01 MED ORDER — GADOBUTROL 1 MMOL/ML IV SOLN
10.0000 mL | Freq: Once | INTRAVENOUS | Status: AC | PRN
  Administered 2022-11-01: 10 mL via INTRAVENOUS

## 2022-11-07 ENCOUNTER — Other Ambulatory Visit: Payer: Self-pay | Admitting: Family Medicine

## 2022-11-07 DIAGNOSIS — G8929 Other chronic pain: Secondary | ICD-10-CM

## 2022-11-07 DIAGNOSIS — T7840XD Allergy, unspecified, subsequent encounter: Secondary | ICD-10-CM

## 2022-11-10 ENCOUNTER — Encounter: Payer: Self-pay | Admitting: "Endocrinology

## 2022-11-10 ENCOUNTER — Ambulatory Visit: Payer: Medicaid Other | Admitting: "Endocrinology

## 2022-11-10 VITALS — BP 104/56 | HR 88 | Ht 63.0 in | Wt 138.0 lb

## 2022-11-10 DIAGNOSIS — E042 Nontoxic multinodular goiter: Secondary | ICD-10-CM

## 2022-11-10 NOTE — Progress Notes (Signed)
11/10/2022, 11:17 AM  Endocrinology follow-up note   Subjective:    Patient ID: Destiny Manning, female    DOB: 22-Mar-1971, PCP Tommie Sams, DO   Past Medical History:  Diagnosis Date   Asthma dx 06/04/07   ventolin, singulair   Blood transfusion without reported diagnosis    CHF (congestive heart failure) (HCC) 03/19/2008   a. 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 normalized by repeat imaging and at 55-60% in 04/2019,   DCM (dilated cardiomyopathy) (HCC) dx 06/06/07   EF 10-15%   Degeneration of spine    DJD (degenerative joint disease), lumbar 11/16/2016   chronic lumbar pain   GERD (gastroesophageal reflux disease)    takes Nexium Daily   Goiter 09/09/2020   Hyperlipidemia, unspecified 09/21/2021   Hypertension    Sciatica 09/21/2021   Seasonal allergies    takes zyrtec   Past Surgical History:  Procedure Laterality Date   ABDOMINAL HYSTERECTOMY     fibroids; partial- ovaries intact   BIOPSY  02/17/2021   Procedure: BIOPSY;  Surgeon: Corbin Ade, MD;  [facility];  Service: Endoscopy;;   CERVICAL SPINE SURGERY  2002   [facility], ruptured disc   CHOLECYSTECTOMY  2001   laparoscopic, [facility]   COLONOSCOPY WITH PROPOFOL N/A 07/12/2017   tubular adenoma (10 mm polyp in cecum), segmental biopsies negative. 5 year surveillance   COLONOSCOPY WITH PROPOFOL N/A 10/18/2022   Procedure: COLONOSCOPY WITH PROPOFOL;  Surgeon: Lanelle Bal, DO;  [facility];  Service: Endoscopy;  Laterality: N/A;  7:30 am, asa 3   ESOPHAGOGASTRODUODENOSCOPY (EGD) WITH ESOPHAGEAL DILATION N/A 09/05/2012   [id number] hiatal hernia s/p esophageal dilation with 54 F Maloney.    ESOPHAGOGASTRODUODENOSCOPY (EGD) WITH PROPOFOL N/A 07/12/2017   normal esophagus s/p dilation, normal duodenal bulb and second portion of duodenum   ESOPHAGOGASTRODUODENOSCOPY (EGD) WITH PROPOFOL N/A 02/17/2021   Surgeon: Corbin Ade, MD;   Normal esophagus s/p dilation, normal stomach and examined duodenum.   LAPAROSCOPIC BILATERAL SALPINGO OOPHERECTOMY Bilateral 12/15/2020   Procedure: LAPAROSCOPIC BILATERAL SALPINGO OOPHORECTOMY;  Surgeon: Lazaro Arms, MD;  Location: [facility];  Service: Gynecology;  Laterality: Bilateral;   MALONEY DILATION N/A 07/12/2017   Procedure: Elease Hashimoto DILATION;  Surgeon: Corbin Ade, MD;  [facility];  Service: Endoscopy;  Laterality: N/A;   MALONEY DILATION N/A 02/17/2021   Procedure: Elease Hashimoto DILATION;  Surgeon: Corbin Ade, MD;  [facility];  Service: Endoscopy;  Laterality: N/A;   POLYPECTOMY  07/12/2017   Procedure: POLYPECTOMY;  Surgeon: Corbin Ade, MD;  [facility];  Service: Endoscopy;;  Cecal polyp (HS)   POLYPECTOMY  10/18/2022   Procedure: POLYPECTOMY;  Surgeon: Lanelle Bal, DO;  [facility];  Service: Endoscopy;;   SPINE SURGERY     TUBAL LIGATION     VAGINAL HYSTERECTOMY  10/19/2010   Procedure: HYSTERECTOMY VAGINAL;  Surgeon: Lazaro Arms, MD;  Location: [facility];  Service: Gynecology;  Laterality: N/A;   Social History   Socioeconomic History   Marital status: Single    Spouse name: Not on file   Number of children: 2   Years  of education: 16   Highest education level: Not on file  Occupational History   Occupation: [employer]    Comment: Comptroller- works with IVC patients  Tobacco Use   Smoking status: Former    Current packs/day: 0.00    Average packs/day: 0.3 packs/day for 4.0 years (1.0 ttl pk-yrs)    Types: Cigarettes    Start date: 03/14/2005    Quit date: 03/14/2009    Years since quitting: 13.6   Smokeless tobacco: Never  Vaping Use   Vaping status: Never Used  Substance and Sexual Activity   Alcohol use: No   Drug use: No   Sexual activity: Not Currently    Birth control/protection: Surgical    Comment: hyst  Other Topics Concern   Not on file  Social History Narrative   Comptroller at  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      Lives with 2 children- age 50 and 54; she homeschools them   Social Determinants of Health   Financial Resource Strain: Low Risk  (01/04/2022)   Overall Financial Resource Strain (CARDIA)    Difficulty of Paying Living Expenses: Not hard at all  Food Insecurity: No Food Insecurity (01/04/2022)   Hunger Vital Sign    Worried About Running Out of Food in the Last Year: Never true    Ran Out of Food in the Last Year: Never true  Transportation Needs: No Transportation Needs (01/04/2022)   PRAPARE - Administrator, Civil Service (Medical): No    Lack of Transportation (Non-Medical): No  Physical Activity: Inactive (01/04/2022)   Exercise Vital Sign    Days of Exercise per Week: 0 days    Minutes of Exercise per Session: 0 min  Stress: No Stress Concern Present (01/04/2022)   [employer] of Occupational Health - Occupational Stress Questionnaire    Feeling of Stress : Not at all  Social Connections: Moderately Isolated (01/04/2022)   Social Connection and Isolation Panel [NHANES]    Frequency of Communication with Friends and Family: More than three times a week    Frequency of Social Gatherings with Friends and Family: Once a week    Attends Religious Services: 1 to 4 times per year    Active Member of [employer] or Organizations: No    Attends Engineer, structural: Never    Marital Status: Never married   Family History  Problem Relation Age of Onset   Diabetes Mother    Hypertension Mother    Congestive Heart Failure Father 47   Arthritis Father    Dementia Maternal Grandmother    Congestive Heart Failure Paternal Grandmother    Congestive Heart Failure Paternal Grandfather    Cancer Maternal Uncle    Anesthesia problems Neg Hx    Hypotension Neg Hx    Pseudochol deficiency Neg Hx    Malignant hyperthermia Neg Hx    Colon cancer Neg Hx    Colon polyps Neg Hx    Outpatient Encounter Medications as of  11/10/2022  Medication Sig   albuterol (PROVENTIL) (2.5 MG/3ML) 0.083% nebulizer solution Take 3 mLs (2.5 mg total) by nebulization every 6 (six) hours as needed for wheezing or shortness of breath.   atorvastatin (LIPITOR) 10 MG tablet Take 1 tablet (10 mg total) by mouth daily.   baclofen (LIORESAL) 10 MG tablet TAKE 1 TABLET BY MOUTH WITH FOOD OR MILK 3 TIMES DAILY AS NEEDED.   Biotin 24401 MCG TABS Take 10,000 mcg by mouth daily.   carvedilol (  COREG) 25 MG tablet TAKE ONE TABLET BY MOUTH 2 TIMES A DAY   cetirizine-pseudoephedrine (ZYRTEC-D) 5-120 MG tablet Take 1 tablet by mouth 2 (two) times daily. (Patient taking differently: Take 1 tablet by mouth 2 (two) times daily as needed for allergies.)   chlorthalidone (HYGROTON) 25 MG tablet Take 0.5 tablets (12.5 mg total) by mouth daily.   DERMA-SMOOTHE/FS SCALP 0.01 % OIL Apply 1 Application topically at bedtime.   estradiol (ESTRACE) 2 MG tablet TAKE 1 TABLET BY MOUTH ONCE A DAY.   fluticasone (FLONASE) 50 MCG/ACT nasal spray Place 1 spray into both nostrils daily. (Patient taking differently: Place 1 spray into both nostrils daily as needed for allergies.)   fluticasone-salmeterol (ADVAIR DISKUS) 500-50 MCG/ACT AEPB Inhale 1 puff into the lungs in the morning and at bedtime. (Patient taking differently: Inhale 1 puff into the lungs 2 (two) times daily as needed (shortness of breath).)   gabapentin (NEURONTIN) 800 MG tablet TAKE 1 TABLET BY MOUTH THREE TIMES A DAY.   losartan (COZAAR) 50 MG tablet Take 1 tablet (50 mg total) by mouth daily.   lubiprostone (AMITIZA) 8 MCG capsule TAKE (1) CAPSULE BY MOUTH TWICE DAILY.   Minoxidil (MINOXIDIL FOR WOMEN) 5 % FOAM Apply 1/2 capful to scalp/hair once daily.   montelukast (SINGULAIR) 10 MG tablet TAKE ONE TABLET BY MOUTH ONCE DAILY   ondansetron (ZOFRAN) 4 MG tablet Take 1 tablet (4 mg total) by mouth every 8 (eight) hours as needed for nausea or vomiting.   pantoprazole (PROTONIX) 40 MG tablet Take 1  tablet (40 mg total) by mouth 2 (two) times daily before a meal.   potassium chloride SA (KLOR-CON M) 20 MEQ tablet Take 1 tablet (20 mEq total) by mouth daily.   SUMAtriptan (IMITREX) 50 MG tablet Take 1 tablet (50 mg total) by mouth 2 (two) times daily as needed for migraine. May repeat in 2 hours if headache persists or recurs.   [DISCONTINUED] albuterol (VENTOLIN HFA) 108 (90 Base) MCG/ACT inhaler Inhale 2 puffs into the lungs every 6 (six) hours as needed for wheezing or shortness of breath.   [DISCONTINUED] atorvastatin (LIPITOR) 10 MG tablet TAKE ONE TABLET BY MOUTH ONCE DAILY   [DISCONTINUED] baclofen (LIORESAL) 10 MG tablet Take 1 tablet (10 mg total) by mouth 3 (three) times daily.   [DISCONTINUED] carvedilol (COREG) 25 MG tablet TAKE ONE TABLET BY MOUTH 2 TIMES A DAY   [DISCONTINUED] cetirizine (ZYRTEC) 10 MG tablet Take 1 tablet (10 mg total) by mouth daily.   [DISCONTINUED] chlorthalidone (HYGROTON) 25 MG tablet Take 0.5 tablets (12.5 mg total) by mouth daily.   [DISCONTINUED] estradiol (ESTRACE) 2 MG tablet Take 1 tablet (2 mg total) by mouth daily.   [DISCONTINUED] fluticasone (FLONASE) 50 MCG/ACT nasal spray Place 1 spray into both nostrils daily.   [DISCONTINUED] Fluticasone-Salmeterol (ADVAIR) 500-50 MCG/DOSE AEPB Inhale 1 puff into the lungs 2 (two) times daily as needed (respiratory issues.).   [DISCONTINUED] gabapentin (NEURONTIN) 800 MG tablet Take 1 tablet (800 mg total) by mouth 3 (three) times daily.   [DISCONTINUED] losartan (COZAAR) 100 MG tablet TAKE ONE TABLET BY MOUTH ONCE DAILY   [DISCONTINUED] lubiprostone (AMITIZA) 8 MCG capsule TAKE (1) CAPSULE BY MOUTH TWICE DAILY.   [DISCONTINUED] montelukast (SINGULAIR) 10 MG tablet Take 1 tablet (10 mg total) by mouth at bedtime.   [DISCONTINUED] ondansetron (ZOFRAN) 4 MG tablet Take 1 tablet (4 mg total) by mouth every 8 (eight) hours as needed for nausea or vomiting.   [DISCONTINUED] pantoprazole (  PROTONIX) 40 MG tablet Take 1  tablet (40 mg total) by mouth daily before breakfast.   [DISCONTINUED] potassium chloride (KLOR-CON M) 10 MEQ tablet Take 1 tablet (10 mEq total) by mouth 2 (two) times daily.   [DISCONTINUED] predniSONE (DELTASONE) 20 MG tablet Take 1 tablet (20 mg total) by mouth 2 (two) times daily with a meal. (Patient not taking: Reported on 11/25/2021)   [DISCONTINUED] SUMAtriptan (IMITREX) 50 MG tablet Take 1 tablet (50 mg total) by mouth 2 (two) times daily as needed for migraine. May repeat in 2 hours if headache persists or recurs.   [DISCONTINUED] triamcinolone cream (KENALOG) 0.1 % Apply 1 Application topically 2 (two) times daily.   No facility-administered encounter medications on file as of 11/10/2022.   ALLERGIES: No Known Allergies  VACCINATION STATUS: Immunization History  Administered Date(s) Administered   Influenza,inj,Quad PF,6+ Mos 02/19/2017, 04/07/2022   Moderna Covid-19 Vaccine Bivalent Booster 70yrs & up 02/01/2021   Moderna Sars-Covid-2 Vaccination 06/25/2019, 07/30/2019, 05/11/2020, 07/29/2020    HPI TALETHA TWIFORD is 52 y.o. female who presents today with a medical history as above. she is being seen in follow-up after she was seen in consultation for multinodular goiter requested by Tommie Sams, DO.  She was sent for fine-needle aspiration biopsy of a thyroid nodule on the right lobe of her thyroid.  Her previous biopsy she has no new complaints today.    Her previsit thyroid ultrasound on October 23, 2022 is unremarkable.  She did not get her previsit thyroid function tests.   See notes from previous visit.    She still denies dysphagia, shortness of breath, nor voice change.   Her last thyroid function test was consistent with euthyroid presentation.  She is not on antithyroid medications, nor thyroid hormone supplements.  She is not a smoker.  Her medical history includes well-controlled hypertension, hyperlipidemia, CHF.  Review of Systems  Constitutional: no recent  weight gain/loss, no fatigue, no subjective hyperthermia, no subjective hypothermia Eyes: no blurry vision, no xerophthalmia ENT: no sore throat, no nodules palpated in throat, no dysphagia/odynophagia, no hoarseness   Objective:       11/10/2022    8:59 AM 10/18/2022    8:13 AM 10/18/2022    6:43 AM  Vitals with BMI  Height 5\' 3"   5\' 3"   Weight 138 lbs  144 lbs 13 oz  BMI 24.45  25.66  Systolic 104 97 118  Diastolic 56 53 60  Pulse 88 71 67    BP (!) 104/56   Pulse 88   Ht 5\' 3"  (1.6 m)   Wt 138 lb (62.6 kg)   LMP 07/04/2010   BMI 24.45 kg/m   Wt Readings from Last 3 Encounters:  11/10/22 138 lb (62.6 kg)  10/18/22 144 lb 13.5 oz (65.7 kg)  10/13/22 145 lb (65.8 kg)    Physical Exam  Constitutional:  Body mass index is 24.45 kg/m.,  not in acute distress, normal state of mind Eyes: PERRLA, EOMI, no exophthalmos ENT: moist mucous membranes, + palpable, normal  size thyroid , no gross cervical lymphadenopathy   CMP ( most recent) CMP     Component Value Date/Time   NA 137 06/12/2022 1002   K 4.0 06/12/2022 1002   CL 99 06/12/2022 1002   CO2 24 06/12/2022 1002   GLUCOSE 87 06/12/2022 1002   GLUCOSE 102 (H) 05/30/2022 0919   BUN 11 06/12/2022 1002   CREATININE 0.91 06/12/2022 1002   CALCIUM 9.2 06/12/2022 1002  PROT 6.9 04/07/2022 1101   ALBUMIN 4.3 04/07/2022 1101   AST 13 04/07/2022 1101   ALT 6 04/07/2022 1101   ALKPHOS 63 04/07/2022 1101   BILITOT 0.3 04/07/2022 1101   GFRNONAA >60 05/30/2022 0919   GFRAA >60 07/05/2017 0912       Lipid Panel ( most recent) Lipid Panel     Component Value Date/Time   CHOL 163 04/07/2022 1101   TRIG 162 (H) 04/07/2022 1101   HDL 54 04/07/2022 1101   CHOLHDL 3.0 04/07/2022 1101   LDLCALC 81 04/07/2022 1101   LABVLDL 28 04/07/2022 1101      Lab Results  Component Value Date   TSH 0.480 11/02/2021   TSH 0.504 09/07/2020   FREET4 1.63 11/02/2021   FREET4 1.41 09/07/2020     Thyroid ultrasound on October 13, 2021 IMPRESSION: 1. Large multinodular thyroid gland most consistent with multinodular goiter. The majority of the nodules do not meet criteria to warrant biopsy or imaging surveillance. However, there are 3 that do meet criteria and they are listed below. 2. Nodule #3 (1.5 cm TI-RADS category 4) in the right medial inferior gland meets criteria to consider fine-needle aspiration biopsy. 3. Nodule #1 and #4 meet criteria for imaging surveillance. Recommend follow-up ultrasound in 1 year.    October 26, 2021, fine-needle aspiration of right inferior lobe nodule. Clinical History: goiter  Specimen Submitted:  A. THYROID, RIGHT INFERIOR LOBE, FINE NEEDLE ASPIRATION:    FINAL MICROSCOPIC DIAGNOSIS:  - Consistent with benign follicular nodule (Bethesda category II)   SPECIMEN ADEQUACY:  Satisfactory for evaluation  Thyroid ultrasound on October 23, 2022 IMPRESSION: 1. Previously biopsied nodule in the right mid to lower gland demonstrates no interval change. Recommend correlation with prior biopsy results. 2. Previously identified nodules # 1 and # 4 demonstrate no interval change. This exam confirms 1 year of stability. As before, these lesions meet criteria for imaging surveillance. Recommend follow-up ultrasound in 1 year. 3. Nodules # 5 and 7 appear more solid on today's examination. These lesions demonstrate no significant change in size compared to the prior study consistent with 1 year of stability. However, both lesions are now considered TI-RADS category 3 and meet criteria for continued imaging surveillance. Follow-up ultrasound in 1 year.   Assessment & Plan:   1. Multinodular goiter I reviewed her previsit thyroid ultrasound findings against her previous  fine-needle aspiration biopsy results with her.  Her biopsy results are benign, she will not need thyroid surgery nor thyroid ablation at this time.  The nodule biopsied was on the right inferior lobe.  For the rest of  her thyroid nodules, she will need follow-up with surveillance thyroid ultrasound imaging studies  Before her next visit in a year. She will also need repeat thyroid function test anytime now and before her next visit in a year.  She will be called if her results are abnormal.    -She is advised to continue her other medications for hypertension, hyperlipidemia, and CHF. - she is advised to maintain close follow up with Tommie Sams, DO for primary care needs.  I spent  21  minutes in the care of the patient today including review of labs from Thyroid Function, CMP, and other relevant labs ; imaging/biopsy records (current and previous including abstractions from other facilities); face-to-face time discussing  her lab results and symptoms, medications doses, her options of short and long term treatment based on the latest standards of care / guidelines;  and documenting the encounter.  Destiny Manning  participated in the discussions, expressed understanding, and voiced agreement with the above plans.  All questions were answered to her satisfaction. she is encouraged to contact clinic should she have any questions or concerns prior to her return visit.   Follow up plan: Return in about 1 year (around 11/10/2023), or labs today and before next visit, for Thyroid / Neck Ultrasound, F/U with Pre-visit Labs.   Marquis Lunch, MD [facility] [address] [place], [place] [address] Phone: [phone]  Fax: [phone]     11/10/2022, 11:17 AM  This note was partially dictated with voice recognition software. Similar sounding words can be transcribed inadequately or may not  be corrected upon review.

## 2022-12-05 ENCOUNTER — Other Ambulatory Visit: Payer: Self-pay | Admitting: Family Medicine

## 2022-12-05 DIAGNOSIS — G8929 Other chronic pain: Secondary | ICD-10-CM

## 2022-12-08 ENCOUNTER — Ambulatory Visit (INDEPENDENT_AMBULATORY_CARE_PROVIDER_SITE_OTHER): Payer: Medicaid Other | Admitting: Nurse Practitioner

## 2022-12-08 VITALS — BP 99/62 | HR 76 | Temp 98.1°F | Ht 63.39 in | Wt 135.8 lb

## 2022-12-08 DIAGNOSIS — Z23 Encounter for immunization: Secondary | ICD-10-CM

## 2022-12-08 DIAGNOSIS — Z0001 Encounter for general adult medical examination with abnormal findings: Secondary | ICD-10-CM

## 2022-12-08 DIAGNOSIS — G8929 Other chronic pain: Secondary | ICD-10-CM | POA: Diagnosis not present

## 2022-12-08 DIAGNOSIS — M542 Cervicalgia: Secondary | ICD-10-CM | POA: Diagnosis not present

## 2022-12-08 DIAGNOSIS — Z0000 Encounter for general adult medical examination without abnormal findings: Secondary | ICD-10-CM

## 2022-12-08 NOTE — Progress Notes (Unsigned)
Subjective:    Patient ID: Destiny Manning, female    DOB: Jul 15, 1970, 52 y.o.   MRN: [id number]  HPI The patient comes in today for a wellness visit.    A review of their health history was completed.  A review of medications was also completed.  Any needed refills; everything  Eating habits: Good; healthy diet with portion control  Falls/  MVA accidents in past few months: No  Regular exercise: Not Regular but active  Specialist pt sees on regular basis: No  Preventative health issues were discussed.   Additional concerns: Sinus congestion, stiff/ pain of the neck; had a "disc replaced" when she was very young after a fall. Has tired multiple treatments including PT. Has seen neurologist in the past.  Gets physicals with GYN Regular follow up with cardiology and endocrinology Regular dental and vision exams.    Review of Systems  Constitutional:  Positive for fatigue. Negative for fever.  HENT:  Positive for congestion and sinus pressure. Negative for ear pain, sore throat and trouble swallowing.   Respiratory:  Negative for cough, chest tightness, shortness of breath and wheezing.   Cardiovascular:  Negative for chest pain and leg swelling.  Gastrointestinal:  Negative for abdominal pain, blood in stool, constipation and diarrhea.  Genitourinary:  Negative for difficulty urinating, dysuria, enuresis, frequency, genital sores, pelvic pain, urgency and vaginal discharge.  Musculoskeletal:  Positive for neck pain and neck stiffness.      12/08/2022    8:34 AM  Depression screen PHQ 2/9  Decreased Interest 0  Down, Depressed, Hopeless 0  PHQ - 2 Score 0  Altered sleeping 0  Tired, decreased energy 0  Change in appetite 0  Feeling bad or failure about yourself  0  Trouble concentrating 0  Moving slowly or fidgety/restless 0  Suicidal thoughts 0  PHQ-9 Score 0  Difficult doing work/chores Not difficult at all      12/08/2022    8:35 AM 06/16/2022   10:37 AM  05/11/2022   11:10 AM 01/04/2022   10:19 AM  GAD 7 : Generalized Anxiety Score  Nervous, Anxious, on Edge 0 0 0 0  Control/stop worrying 0 0 0 0  Worry too much - different things 0 0 0 0  Trouble relaxing 0 0 0 0  Restless 0 0 0 0  Easily annoyed or irritable 0 0 0 0  Afraid - awful might happen 0 0 0 0  Total GAD 7 Score 0 0 0 0  Anxiety Difficulty Not difficult at all Not difficult at all Not difficult at all          Objective:   Physical Exam Constitutional:      General: She is not in acute distress.    Appearance: She is well-developed.  HENT:     Ears:     Comments: Left TM: retracted; Right mild clear effusion; no erythema bilat    Mouth/Throat:     Mouth: Mucous membranes are moist.     Pharynx: Oropharynx is clear.  Neck:     Thyroid: No thyromegaly.     Trachea: No tracheal deviation.     Comments: Thyroid non tender to palpation. No mass or goiter noted.  Cardiovascular:     Rate and Rhythm: Normal rate and regular rhythm.     Heart sounds: Normal heart sounds. No murmur heard. Pulmonary:     Effort: Pulmonary effort is normal.     Breath sounds: Normal breath sounds.  Abdominal:     General: There is no distension.     Palpations: Abdomen is soft.     Tenderness: There is no abdominal tenderness.  Musculoskeletal:     Cervical back: Normal range of motion and neck supple.     Right lower leg: No edema.     Left lower leg: No edema.     Comments: Normal ROM of the cervical spine; tight tender muscles noted along neck and upper back area, worse on the right  Lymphadenopathy:     Cervical: No cervical adenopathy.     Upper Body:     Right upper body: No supraclavicular adenopathy.     Left upper body: No supraclavicular adenopathy.  Skin:    General: Skin is warm and dry.     Findings: No rash.  Neurological:     Mental Status: She is alert and oriented to person, place, and time.  Psychiatric:        Mood and Affect: Mood normal.        Behavior:  Behavior normal.        Thought Content: Thought content normal.        Judgment: Judgment normal.    Today's Vitals   12/08/22 0821  BP: 99/62  Pulse: 76  Temp: 98.1 F (36.7 C)  SpO2: 100%  Weight: 135 lb 12.8 oz (61.6 kg)  Height: 5' 3.39" (1.61 m)   Body mass index is 23.76 kg/m.         Assessment & Plan:   Problem List Items Addressed This Visit       Other   Chronic neck pain   Relevant Orders   Ambulatory referral to Neurosurgery   Other Visit Diagnoses     Annual physical exam    -  Primary   Relevant Orders   Tdap vaccine greater than or equal to 7yo IM (Completed)   Need for vaccination       Relevant Orders   Tdap vaccine greater than or equal to 7yo IM (Completed)      For neck pain recommend massage, TENS unit, stretching and ice/heat applications. Referred back to neurosurgery for evaluation. Tdap today. Recommend flu vaccine this fall. Continue healthy diet. Recommend activity as tolerated.  Return in about 1 year (around 12/08/2023) for physical.

## 2022-12-09 ENCOUNTER — Encounter: Payer: Self-pay | Admitting: Nurse Practitioner

## 2022-12-09 DIAGNOSIS — G8929 Other chronic pain: Secondary | ICD-10-CM | POA: Insufficient documentation

## 2023-01-02 ENCOUNTER — Other Ambulatory Visit: Payer: Self-pay | Admitting: Family Medicine

## 2023-01-02 DIAGNOSIS — M545 Low back pain, unspecified: Secondary | ICD-10-CM

## 2023-01-22 ENCOUNTER — Other Ambulatory Visit: Payer: Self-pay | Admitting: Obstetrics & Gynecology

## 2023-02-05 ENCOUNTER — Other Ambulatory Visit: Payer: Self-pay | Admitting: Family Medicine

## 2023-02-05 DIAGNOSIS — T7840XD Allergy, unspecified, subsequent encounter: Secondary | ICD-10-CM

## 2023-02-06 ENCOUNTER — Telehealth: Payer: Self-pay

## 2023-02-06 NOTE — Telephone Encounter (Signed)
Prescription Request  02/06/2023  LOV: Visit date not found  What is the name of the medication or equipment? montelukast (SINGULAIR) 10 MG tablet   Have you contacted your pharmacy to request a refill? Yes   Which pharmacy would you like this sent to?  [facility]   Patient notified that their request is being sent to the clinical staff for review and that they should receive a response within 2 business days.   Please advise at Mobile [phone] (mobile)

## 2023-02-08 ENCOUNTER — Other Ambulatory Visit: Payer: Self-pay | Admitting: Nurse Practitioner

## 2023-03-05 ENCOUNTER — Other Ambulatory Visit: Payer: Self-pay | Admitting: Family Medicine

## 2023-03-05 ENCOUNTER — Other Ambulatory Visit: Payer: Self-pay | Admitting: Internal Medicine

## 2023-03-05 DIAGNOSIS — M545 Low back pain, unspecified: Secondary | ICD-10-CM

## 2023-03-22 NOTE — Telephone Encounter (Signed)
Copied from CRM [id number]. Topic: Clinical - Medication Refill >> Mar 22, 2023  9:13 AM Georgeanna Harrison H wrote: Most Recent Primary Care Visit:  Provider: Campbell Riches  Department: [facility]  Visit Type: PHYSICAL  Date: 12/08/2022  Medication: gabapentin (NEURONTIN) 800 MG tablet  Has the patient contacted their pharmacy? Yes (Agent: If no, request that the patient contact the pharmacy for the refill. If patient does not wish to contact the pharmacy document the reason why and proceed with request.) (Agent: If yes, when and what did the pharmacy advise?)  Is this the correct pharmacy for this prescription? Yes If no, delete pharmacy and type the correct one.  This is the patient's preferred pharmacy:   [facility] - [place], [place] - [address] [place] [address] Phone: [phone] Fax: [phone]    Has the prescription been filled recently? Yes  Is the patient out of the medication? Yes  Has the patient been seen for an appointment in the last year OR does the patient have an upcoming appointment? Yes  Can we respond through MyChart? Yes  Agent: Please be advised that Rx refills may take up to 3 business days. We ask that you follow-up with your pharmacy.

## 2023-03-23 ENCOUNTER — Telehealth: Payer: Self-pay | Admitting: Family Medicine

## 2023-03-23 NOTE — Telephone Encounter (Signed)
Refill on   gabapentin (NEURONTIN) 800 MG tablet  Send to [facility]

## 2023-03-25 ENCOUNTER — Other Ambulatory Visit: Payer: Self-pay | Admitting: Family Medicine

## 2023-03-25 DIAGNOSIS — G8929 Other chronic pain: Secondary | ICD-10-CM

## 2023-03-25 MED ORDER — GABAPENTIN 800 MG PO TABS
800.0000 mg | ORAL_TABLET | Freq: Three times a day (TID) | ORAL | 3 refills | Status: DC
Start: 2023-03-25 — End: 2023-10-29

## 2023-03-27 ENCOUNTER — Encounter: Payer: Self-pay | Admitting: Family Medicine

## 2023-04-06 ENCOUNTER — Other Ambulatory Visit: Payer: Self-pay | Admitting: Internal Medicine

## 2023-04-11 HISTORY — PX: BIOPSY THYROID: PRO38

## 2023-04-18 ENCOUNTER — Encounter: Payer: Self-pay | Admitting: Adult Health

## 2023-04-18 ENCOUNTER — Ambulatory Visit: Payer: Medicaid Other | Admitting: Adult Health

## 2023-04-18 VITALS — BP 120/72 | HR 79 | Ht 63.0 in | Wt 134.0 lb

## 2023-04-18 DIAGNOSIS — R300 Dysuria: Secondary | ICD-10-CM | POA: Insufficient documentation

## 2023-04-18 DIAGNOSIS — R102 Pelvic and perineal pain: Secondary | ICD-10-CM | POA: Diagnosis not present

## 2023-04-18 DIAGNOSIS — R110 Nausea: Secondary | ICD-10-CM | POA: Insufficient documentation

## 2023-04-18 DIAGNOSIS — Z90710 Acquired absence of both cervix and uterus: Secondary | ICD-10-CM

## 2023-04-18 DIAGNOSIS — R319 Hematuria, unspecified: Secondary | ICD-10-CM | POA: Insufficient documentation

## 2023-04-18 DIAGNOSIS — Z90721 Acquired absence of ovaries, unilateral: Secondary | ICD-10-CM

## 2023-04-18 LAB — POCT URINALYSIS DIPSTICK
Glucose, UA: NEGATIVE
Ketones, UA: NEGATIVE
Leukocytes, UA: NEGATIVE
Nitrite, UA: NEGATIVE
Protein, UA: NEGATIVE

## 2023-04-18 MED ORDER — PROMETHAZINE HCL 25 MG PO TABS: 25.0000 mg | ORAL_TABLET | Freq: Four times a day (QID) | ORAL | 1 refills | Status: DC | PRN

## 2023-04-18 MED ORDER — SULFAMETHOXAZOLE-TRIMETHOPRIM 800-160 MG PO TABS: 1.0000 | ORAL_TABLET | Freq: Two times a day (BID) | ORAL | 0 refills | Status: DC

## 2023-04-18 MED ORDER — TRIAMCINOLONE ACETONIDE 0.5 % EX OINT: 1.0000 | TOPICAL_OINTMENT | Freq: Two times a day (BID) | CUTANEOUS | 0 refills | Status: AC

## 2023-04-18 MED ORDER — NYSTATIN 100000 UNIT/GM EX CREA: 1.0000 | TOPICAL_CREAM | Freq: Two times a day (BID) | CUTANEOUS | 0 refills | Status: AC

## 2023-04-18 NOTE — Progress Notes (Signed)
 Subjective:     Patient ID: Destiny Manning, female   DOB: 08/02/1970, 53 y.o.   MRN: [id number]  HPI Lindora is 53 year old black female,single sp hysterectomy in complaining of pelvic pain, can be sharp at times, has had for last few days and has stinging with urination. Has nausea at times too, and requests refills on nystatin  ointment and triamcinolone  ointment.   PCP is Dr Bluford  Review of Systems  +pelvic pain, can be sharp at times, has had for last few days +stinging with urination.  + nausea at times too   No problems with BM  Has not had sex in years Reviewed past medical,surgical, social and family history. Reviewed medications and allergies.  Objective:   Physical Exam BP 120/72 (BP Location: Left Arm, Patient Position: Sitting, Cuff Size: Normal)   Pulse 79   Ht 5' 3 (1.6 m)   Wt 134 lb (60.8 kg)   LMP 07/04/2010   BMI 23.74 kg/m  urine dipstick 1+blood Skin warm and dry.Pelvic: external genitalia is normal in appearance no lesions, vagina:pale,urethra has no lesions or masses noted, cervix and uterus are absent,adnexa: no masses LLQ tenderness noted, and tender on right at waist, no rebound noted. Bladder is +tender and no masses felt. No CVAT noted.   Fall risk is low  Up[facility] - 04/18/23 1356       Pregnancy Intention Screening   Does the patient want to become pregnant in the next year? N/A    Does the patient's partner want to become pregnant in the next year? N/A    Would the patient like to discuss contraceptive options today? N/A      Contraception Wrap Up   Current Method Female Sterilization   hyst   End Method Female Sterilization   hyst   Contraception Counseling Provided No            Examination chaperoned by Clarita Salt LPN  Assessment:     1. Burning with urination - POCT Urinalysis Dipstick - Urine Culture - Urinalysis, Routine w reflex microscopic  2. Hematuria, unspecified type UA C& S sent  - POCT Urinalysis Dipstick - Urine  Culture - Urinalysis, Routine w reflex microscopic  3. Pelvic pain (Primary) Has had pelvic pain last few days sharp at times Bladder was tender Will get UA C&S  Rx septra  ds 1 bid x 7 days for ? UTI Push fluids - POCT Urinalysis Dipstick - Urine Culture - Urinalysis, Routine w reflex microscopic  4. S/P hysterectomy with oophorectomy  5. Nausea Will rx phenergan  for nausea   Meds ordered this encounter  Medications   sulfamethoxazole -trimethoprim  (BACTRIM  DS) 800-160 MG tablet    Sig: Take 1 tablet by mouth 2 (two) times daily. Take 1 bid    Dispense:  14 tablet    Refill:  0    Supervising Provider:   JAYNE MINDER H [[id number]]   promethazine  (PHENERGAN ) 25 MG tablet    Sig: Take 1 tablet (25 mg total) by mouth every 6 (six) hours as needed for nausea or vomiting.    Dispense:  30 tablet    Refill:  1    Supervising Provider:   JAYNE MINDER H [[id number]]   nystatin  cream (MYCOSTATIN )    Sig: Apply 1 Application topically 2 (two) times daily.    Dispense:  30 g    Refill:  0    Supervising Provider:   JAYNE MINDER H [[id number]]   triamcinolone  ointment (KENALOG ) 0.5 %  Sig: Apply 1 Application topically 2 (two) times daily.    Dispense:  30 g    Refill:  0    Supervising Provider:   JAYNE MINDER H [[id number]]       Refilled nystatin  and triamcinolone  at her request  Plan:     Follow up in 1 week if not better, or before if needed If any fever or pain increases go to ER

## 2023-04-19 LAB — URINALYSIS, ROUTINE W REFLEX MICROSCOPIC
Bilirubin, UA: NEGATIVE
Glucose, UA: NEGATIVE
Ketones, UA: NEGATIVE
Leukocytes,UA: NEGATIVE
Nitrite, UA: NEGATIVE
Protein,UA: NEGATIVE
Specific Gravity, UA: 1.008 (ref 1.005–1.030)
Urobilinogen, Ur: 0.2 mg/dL (ref 0.2–1.0)
pH, UA: 7 (ref 5.0–7.5)

## 2023-04-19 LAB — MICROSCOPIC EXAMINATION
Bacteria, UA: NONE SEEN
Casts: NONE SEEN /[LPF]
WBC, UA: NONE SEEN /[HPF] (ref 0–5)

## 2023-04-20 LAB — URINE CULTURE

## 2023-04-23 ENCOUNTER — Other Ambulatory Visit: Payer: Self-pay | Admitting: Adult Health

## 2023-04-25 ENCOUNTER — Ambulatory Visit: Payer: Medicaid Other | Admitting: Adult Health

## 2023-04-26 ENCOUNTER — Other Ambulatory Visit: Payer: Self-pay | Admitting: Physician Assistant

## 2023-05-18 IMAGING — MR MR LUMBAR SPINE W/O CM
5 series · 31 of 48 positions shown · non-contrast
Comparison: None.

CLINICAL DATA: Low back pain for over 6 weeks. No known injury.

EXAM:
MRI LUMBAR SPINE WITHOUT CONTRAST
TECHNIQUE: Multiplanar, multisequence MR imaging of the lumbar spine was
performed. No intravenous contrast was administered.

[Series 5: T2 · sagittal · 4.0mm · 0.68mm/px · 6 of 13 slices shown (1 of 2)]
[im 1/13]
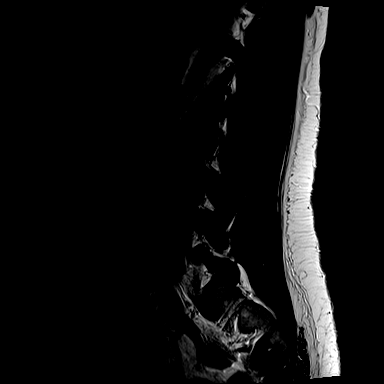
[im 3/13]
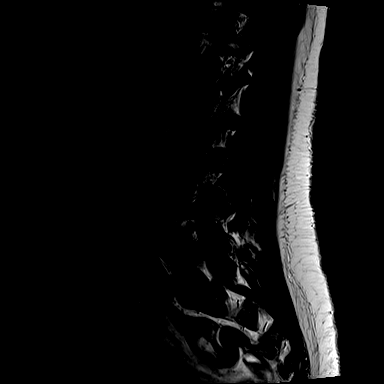
[im 5/13]
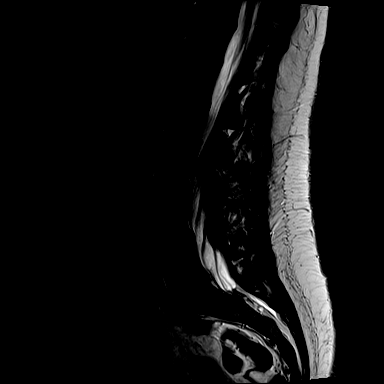
[im 8/13]
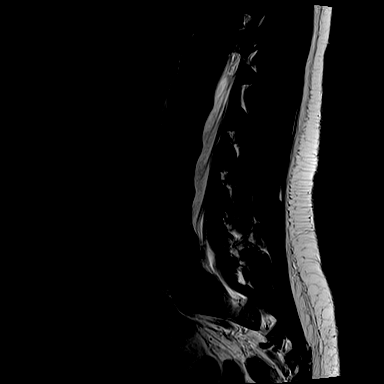
[im 10/13]
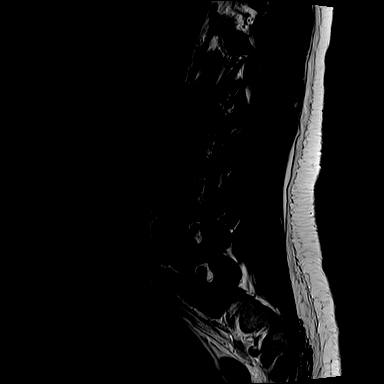
[im 13/13]
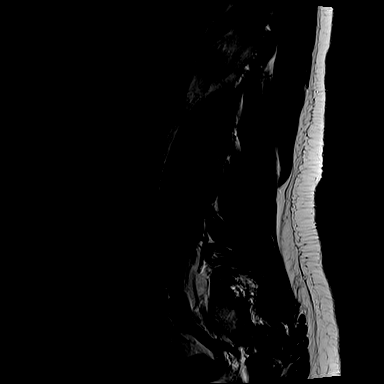

[Series 6: T1 · sagittal · 4.0mm · 0.81mm/px · 6 of 13 slices shown (1 of 2)]
[im 1/13]
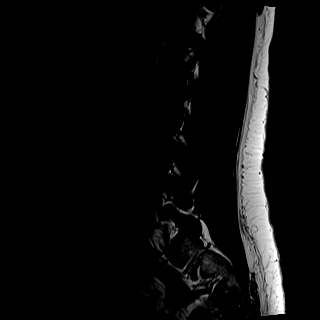
[im 3/13]
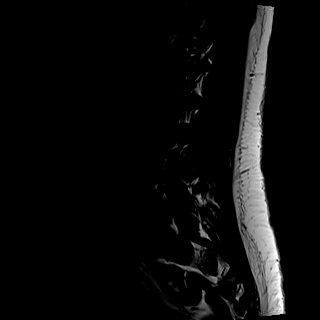
[im 5/13]
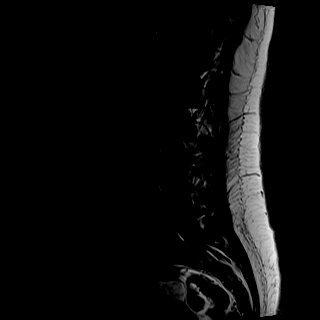
[im 8/13]
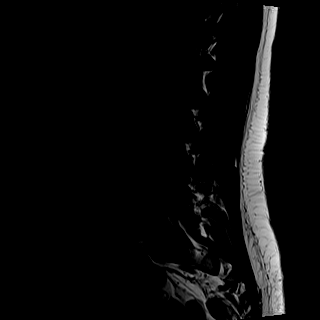
[im 10/13]
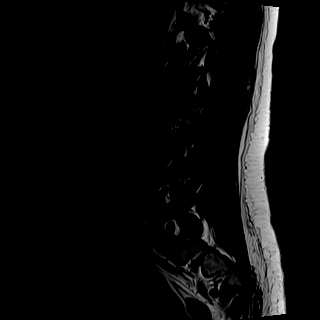
[im 13/13]
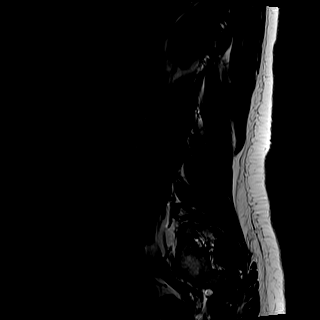

[Series 7: STIR · sagittal · 4.0mm · 0.51mm/px · 1 of 13 slices shown]
[im 1/13]
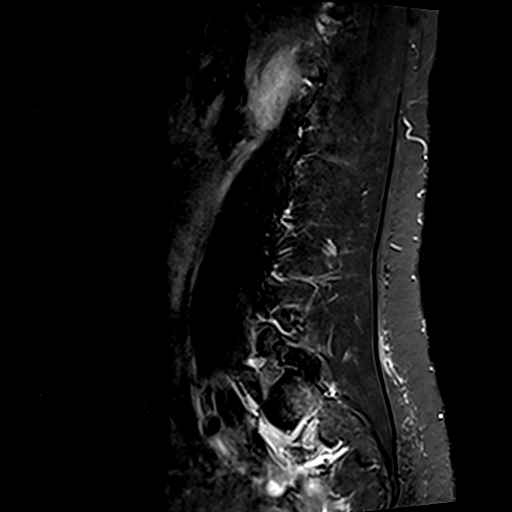

[Series 8: T2 · axial · 4.0mm · 0.70mm/px · z∈[-125,+57]mm · 9 of 31 slices shown (2 of 2)]
[im 1/31]
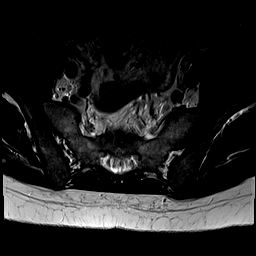
[im 5/31]
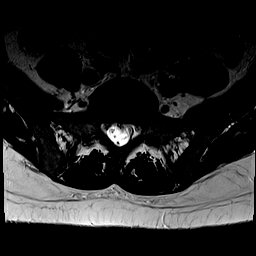
[im 9/31]
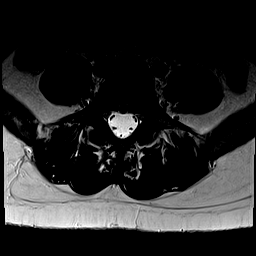
[im 13/31]
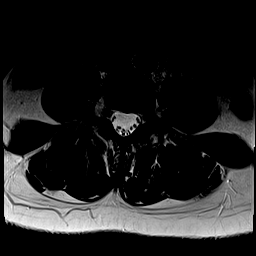
[im 16/31]
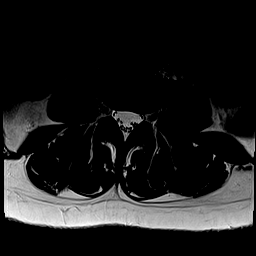
[im 18/31]
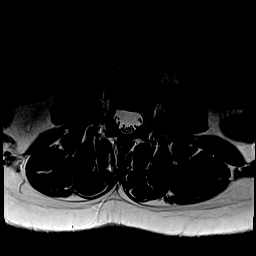
[im 22/31]
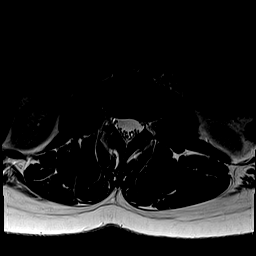
[im 26/31]
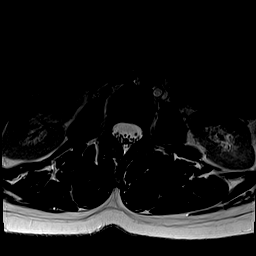
[im 31/31]
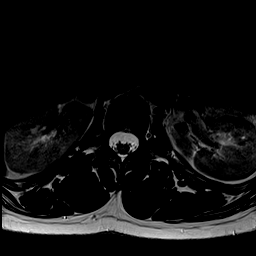

[Series 9: T1 · axial · 4.0mm · 0.35mm/px · z∈[-125,+57]mm · 9 of 31 slices shown (2 of 2)]
[im 1/31]
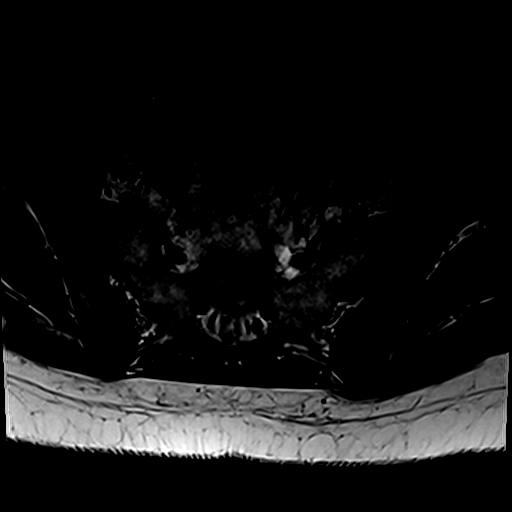
[im 5/31]
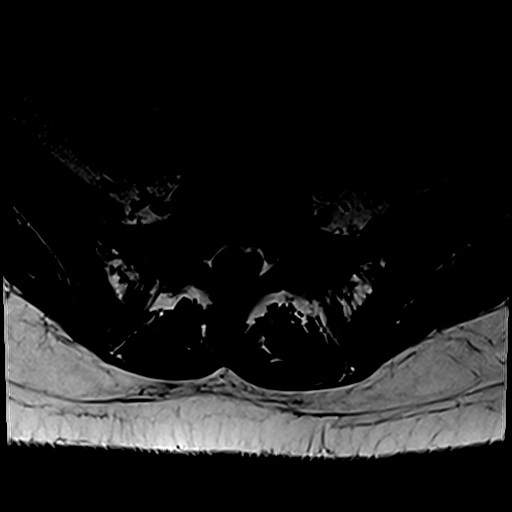
[im 9/31]
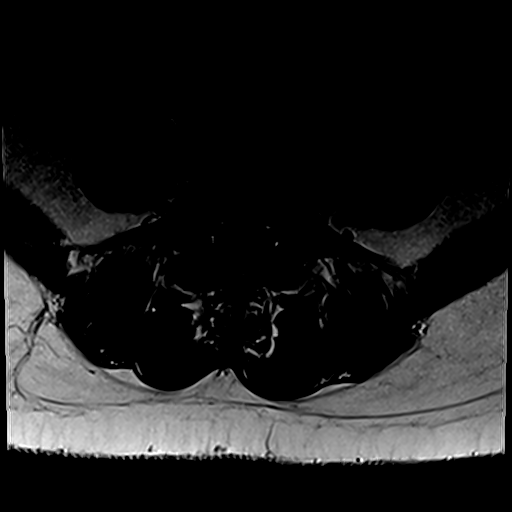
[im 13/31]
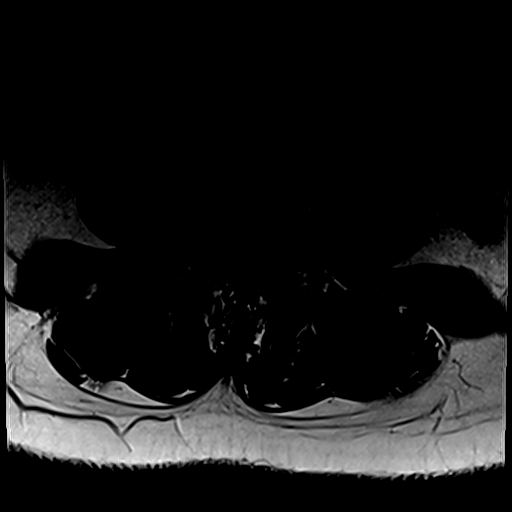
[im 16/31]
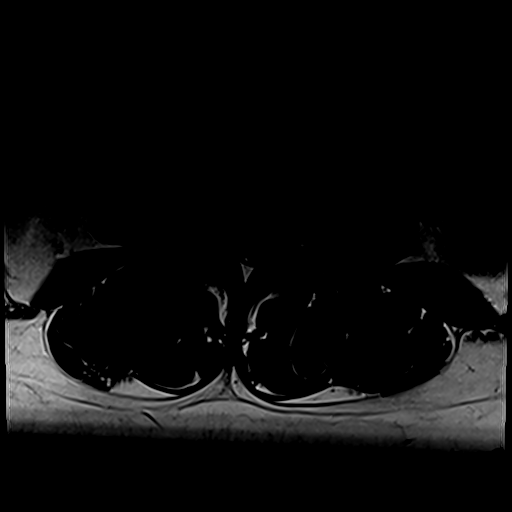
[im 18/31]
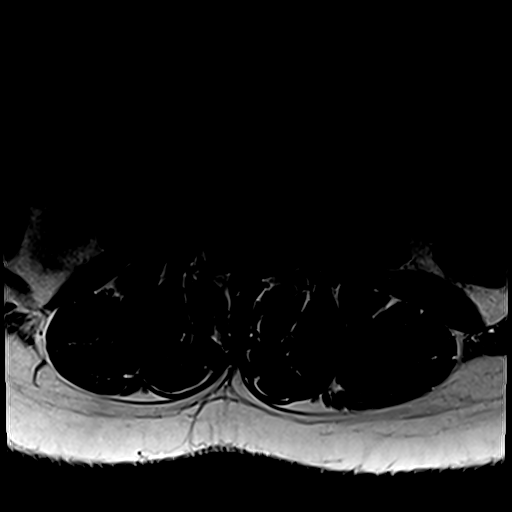
[im 22/31]
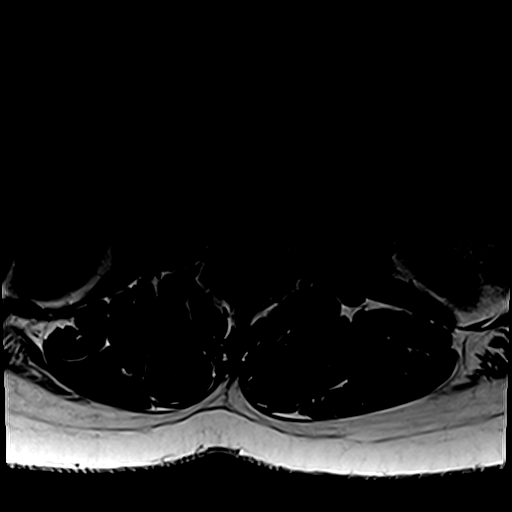
[im 26/31]
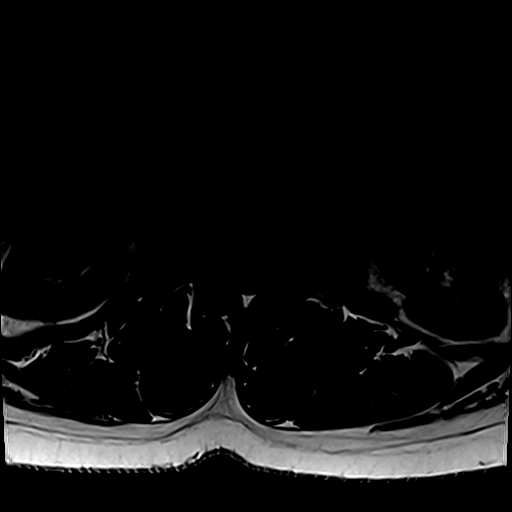
[im 31/31]
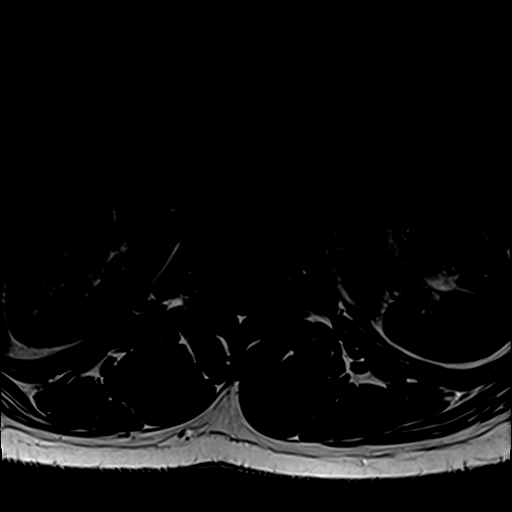

[31 of 48 positions shown; findings below may reference images not displayed]

FINDINGS: Segmentation:  Standard.

Alignment:  Physiologic.

Vertebrae: No acute fracture, evidence of discitis, or aggressive
bone lesion.

Conus medullaris and cauda equina: Conus extends to the L1 level.
Conus and cauda equina appear normal.

Paraspinal and other soft tissues: No acute paraspinal abnormality.

Disc levels:

Disc spaces: Disc desiccation at L4-5 and L5-S1.

T11-12: Tiny left paracentral/foraminal disc protrusion. No
foraminal or central canal stenosis.

T12-L1: No significant disc bulge. No neural foraminal stenosis. No
central canal stenosis.

L1-L2: No significant disc bulge. No neural foraminal stenosis. No
central canal stenosis.

L2-L3: No significant disc bulge. No neural foraminal stenosis. No
central canal stenosis.

L3-L4: No significant disc bulge. No neural foraminal stenosis. No
central canal stenosis.

L4-L5: Mild broad-based disc bulge. No foraminal or central canal
stenosis.

L5-S1: No significant disc bulge. No neural foraminal stenosis. No
central canal stenosis.
IMPRESSION: 1. At T11-12 there is a tiny left paracentral/foraminal disc
protrusion.
2. At L4-5 there is a mild broad-based disc bulge.
3. No acute osseous injury of the lumbar spine.

## 2023-06-01 ENCOUNTER — Other Ambulatory Visit: Payer: Self-pay | Admitting: Physician Assistant

## 2023-06-01 NOTE — Telephone Encounter (Signed)
 This is a [place] pt.

## 2023-06-05 ENCOUNTER — Other Ambulatory Visit: Payer: Self-pay | Admitting: Family Medicine

## 2023-06-05 DIAGNOSIS — M545 Other chronic pain: Secondary | ICD-10-CM

## 2023-06-22 ENCOUNTER — Telehealth: Payer: Self-pay | Admitting: Internal Medicine

## 2023-06-22 MED ORDER — CHLORTHALIDONE 25 MG PO TABS: 12.5000 mg | ORAL_TABLET | Freq: Every day | ORAL | 0 refills | Status: DC

## 2023-06-22 NOTE — Telephone Encounter (Signed)
 Pt's medication was sent to pt's pharmacy as requested. Confirmation received.

## 2023-06-22 NOTE — Telephone Encounter (Signed)
*  STAT* If patient is at the pharmacy, call can be transferred to refill team.   1. Which medications need to be refilled? (please list name of each medication and dose if known) Chlorthalidone 25 mg   2. Would you like to learn more about the convenience, safety, & potential cost savings by using the [facility]?      3. Are you open to using the [facility] (Type [facility]. .   4. Which pharmacy/location (including street and city if local pharmacy) is medication to be sent to?[facility]   5. Do they need a 30 day or 90 day supply? Enough until her appointment on  08-17-23- please call today- out of medicine

## 2023-06-27 ENCOUNTER — Other Ambulatory Visit (HOSPITAL_COMMUNITY): Payer: Self-pay | Admitting: Family Medicine

## 2023-06-27 DIAGNOSIS — Z1231 Encounter for screening mammogram for malignant neoplasm of breast: Secondary | ICD-10-CM

## 2023-07-02 ENCOUNTER — Encounter: Payer: Self-pay | Admitting: Internal Medicine

## 2023-07-02 ENCOUNTER — Other Ambulatory Visit: Payer: Self-pay

## 2023-07-02 MED ORDER — CHLORTHALIDONE 25 MG PO TABS: 12.5000 mg | ORAL_TABLET | Freq: Every day | ORAL | 2 refills | Status: DC

## 2023-07-03 ENCOUNTER — Other Ambulatory Visit: Payer: Self-pay | Admitting: Physician Assistant

## 2023-07-06 ENCOUNTER — Encounter: Payer: Self-pay | Admitting: Family Medicine

## 2023-07-12 ENCOUNTER — Encounter (HOSPITAL_COMMUNITY): Payer: Self-pay

## 2023-07-12 ENCOUNTER — Ambulatory Visit (HOSPITAL_COMMUNITY)
Admission: RE | Admit: 2023-07-12 | Discharge: 2023-07-12 | Disposition: A | Discharge status: Home / Self Care | Source: Ambulatory Visit | Attending: Family Medicine | Admitting: Family Medicine

## 2023-07-12 DIAGNOSIS — Z1231 Encounter for screening mammogram for malignant neoplasm of breast: Secondary | ICD-10-CM | POA: Diagnosis present

## 2023-07-16 ENCOUNTER — Other Ambulatory Visit: Payer: Self-pay | Admitting: Family Medicine

## 2023-07-18 ENCOUNTER — Encounter: Payer: Self-pay | Admitting: Internal Medicine

## 2023-07-19 ENCOUNTER — Ambulatory Visit: Admitting: General Practice

## 2023-07-23 ENCOUNTER — Other Ambulatory Visit: Payer: Self-pay | Admitting: Family Medicine

## 2023-07-23 DIAGNOSIS — T7840XD Allergy, unspecified, subsequent encounter: Secondary | ICD-10-CM

## 2023-08-09 ENCOUNTER — Other Ambulatory Visit: Payer: Self-pay | Admitting: Family Medicine

## 2023-08-09 DIAGNOSIS — M545 Low back pain, unspecified: Secondary | ICD-10-CM

## 2023-08-10 ENCOUNTER — Other Ambulatory Visit: Payer: Self-pay

## 2023-08-10 DIAGNOSIS — M545 Low back pain, unspecified: Secondary | ICD-10-CM

## 2023-08-10 MED ORDER — BACLOFEN 10 MG PO TABS: ORAL_TABLET | ORAL | 0 refills | Status: DC

## 2023-08-17 ENCOUNTER — Ambulatory Visit: Admitting: Nurse Practitioner

## 2023-09-17 ENCOUNTER — Ambulatory Visit: Payer: Self-pay

## 2023-09-17 NOTE — Telephone Encounter (Signed)
 FYI Only or Action Required?: FYI only for provider  Patient was last seen in primary care on 12/08/2022 by Derenda Flax, NP. Called Nurse Triage reporting Ear Problem. Symptoms began a week ago. Interventions attempted: OTC medications: meclizine and Claritin D  and Prescription medications: none. Symptoms are: unchanged.  Triage Disposition: No disposition on file.  Patient/caregiver understands and will follow disposition?:             Copied from CRM [id number]. Topic: Clinical - Red Word Triage >> Sep 17, 2023  9:39 AM Stanly Early wrote: Red Word that prompted transfer to Nurse Triage: dizzy spells,nauseous, middle ear infection. Reason for Disposition  Earache  Answer Assessment - Initial Assessment Questions 1. DESCRIPTION: "Describe your dizziness."     Head spinning 2. VERTIGO: "Do you feel like either you or the room is spinning or tilting?"      Room spinning 3. LIGHTHEADED: "Do you feel lightheaded?" (e.g., somewhat faint, woozy, weak upon standing)     no 4. SEVERITY: "How bad is it?"  "Can you walk?"   - MILD: Feels slightly dizzy and unsteady, but is walking normally.   - MODERATE: Feels unsteady when walking, but not falling; interferes with normal activities (e.g., school, work).   - SEVERE: Unable to walk without falling, or requires assistance to walk without falling.     mod 5. ONSET:  "When did the dizziness begin?"     A week ago 6. AGGRAVATING FACTORS: "Does anything make it worse?" (e.g., standing, change in head position)     no 7. CAUSE: "What do you think is causing the dizziness?"     Ear infection 8. RECURRENT SYMPTOM: "Have you had dizziness before?" If Yes, ask: "When was the last time?" "What happened that time?"     Last week 9. OTHER SYMPTOMS: "Do you have any other symptoms?" (e.g., headache, weakness, numbness, vomiting, earache)     Nauseous, earache and fullness in left side  Was seen a week ago and diagnoses with middle ear  infection but still having symptoms  Protocols used: Dizziness - Vertigo-A-AH

## 2023-09-17 NOTE — Telephone Encounter (Signed)
 Appointment scheduled.

## 2023-09-18 ENCOUNTER — Encounter: Payer: Self-pay | Admitting: Family Medicine

## 2023-09-18 ENCOUNTER — Ambulatory Visit: Admitting: Family Medicine

## 2023-09-18 VITALS — BP 116/77 | HR 85 | Temp 97.4°F

## 2023-09-18 DIAGNOSIS — H9209 Otalgia, unspecified ear: Secondary | ICD-10-CM | POA: Insufficient documentation

## 2023-09-18 DIAGNOSIS — H9202 Otalgia, left ear: Secondary | ICD-10-CM | POA: Diagnosis not present

## 2023-09-18 MED ORDER — AMOXICILLIN-POT CLAVULANATE 875-125 MG PO TABS: 1.0000 | ORAL_TABLET | Freq: Two times a day (BID) | ORAL | 0 refills | Status: DC

## 2023-09-18 NOTE — Assessment & Plan Note (Signed)
 Exam is unrevealing here today.  However, given persistent symptoms I am placing on empiric antibiotic therapy.  This could be related to parotitis. Rx sent for Augmentin .

## 2023-09-18 NOTE — Patient Instructions (Signed)
 Medication as prescribed.  If continues to persist, recommend referral to ENT.

## 2023-09-18 NOTE — Progress Notes (Signed)
 Subjective:  Patient ID: Destiny Manning, female    DOB: 09-Dec-1970  Age: 53 y.o. MRN: [id number]  CC:   Chief Complaint  Patient presents with   Acute Visit    Patient in room #1 and alone. Patient states she been having issues with earache, dizziness and nausea for the pass one week.    HPI:  53 year old female presents for evaluation of the above.  Patient reports that for the past week she has had ongoing left ear pain.  She is also had dizziness and nausea.  Was seen at local urgent care was treated with meclizine and over-the-counter Claritin/decongestant.  She has failed to improve.  She continues to have significant left ear pain.  She also reports that she feels like she is having some fluid or swelling of the left side of the face at the jawline.  No fever.  No cough or sore throat.  No other respiratory symptoms.  No other complaints at this time.  Patient Active Problem List   Diagnosis Date Noted   Ear pain 09/18/2023   Chronic neck pain 12/09/2022   Hair loss 05/11/2022   History of cardiomyopathy 04/07/2022   Fibromuscular dysplasia (HCC) 04/07/2022   Atherosclerosis of aorta (HCC) 04/07/2022   S/P hysterectomy with oophorectomy 01/04/2022   DDD (degenerative disc disease), cervical 09/21/2021   Hyperlipidemia, unspecified 09/21/2021   Migraine 09/21/2021   Chronic right shoulder pain 05/19/2021   Multinodular goiter 09/23/2020   Hypertension 11/16/2016   Asthma in adult, mild persistent, uncomplicated 11/16/2016   Chronic lower back pain 11/16/2016   Gastroesophageal reflux disease 08/13/2012    Social Hx   Social History   Socioeconomic History   Marital status: Single    Spouse name: Not on file   Number of children: 2   Years of education: 16   Highest education level: Not on file  Occupational History   Occupation: [employer]    Comment: Comptroller- works with IVC patients  Tobacco Use   Smoking status: Former    Current packs/day: 0.00     Average packs/day: 0.3 packs/day for 4.0 years (1.0 ttl pk-yrs)    Types: Cigarettes    Start date: 03/14/2005    Quit date: 03/14/2009    Years since quitting: 14.5   Smokeless tobacco: Never  Vaping Use   Vaping status: Never Used  Substance and Sexual Activity   Alcohol use: No   Drug use: No   Sexual activity: Not Currently    Birth control/protection: Surgical    Comment: hyst  Other Topics Concern   Not on file  Social History Narrative   Comptroller at 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      Lives with 2 children- age 54 and 36; she homeschools them   Social Drivers of Corporate investment banker Strain: Low Risk  (01/04/2022)   Overall Financial Resource Strain (CARDIA)    Difficulty of Paying Living Expenses: Not hard at all  Food Insecurity: No Food Insecurity (01/04/2022)   Hunger Vital Sign    Worried About Running Out of Food in the Last Year: Never true    Ran Out of Food in the Last Year: Never true  Transportation Needs: No Transportation Needs (01/04/2022)   PRAPARE - Administrator, Civil Service (Medical): No    Lack of Transportation (Non-Medical): No  Physical Activity: Inactive (01/04/2022)   Exercise Vital Sign    Days of Exercise per Week: 0 days  Minutes of Exercise per Session: 0 min  Stress: No Stress Concern Present (01/04/2022)   [employer] of Occupational Health - Occupational Stress Questionnaire    Feeling of Stress : Not at all  Social Connections: Moderately Isolated (01/04/2022)   Social Connection and Isolation Panel [NHANES]    Frequency of Communication with Friends and Family: More than three times a week    Frequency of Social Gatherings with Friends and Family: Once a week    Attends Religious Services: 1 to 4 times per year    Active Member of [employer] or Organizations: No    Attends Banker Meetings: Never    Marital Status: Never married    Review of Systems Per HPI  Objective:  BP 116/77  (BP Location: Left Arm, Patient Position: Sitting, Cuff Size: Small)   Pulse 85   Temp (!) 97.4 F (36.3 C)   LMP 07/04/2010   SpO2 100%      09/18/2023    8:38 AM 04/18/2023    1:54 PM 12/08/2022    8:21 AM  BP/Weight  Systolic BP 116 120 99  Diastolic BP 77 72 62  Wt. (Lbs)  134 135.8  BMI  23.74 kg/m2 23.76 kg/m2    Physical Exam Vitals and nursing note reviewed.  Constitutional:      General: She is not in acute distress.    Appearance: Normal appearance.  HENT:     Head: Normocephalic and atraumatic.     Ears:     Comments: TMs appear to be normal bilaterally.    Mouth/Throat:     Pharynx: Oropharynx is clear.  Cardiovascular:     Rate and Rhythm: Normal rate and regular rhythm.  Pulmonary:     Effort: Pulmonary effort is normal.     Breath sounds: Normal breath sounds.  Neurological:     Mental Status: She is alert.     Lab Results  Component Value Date   WBC 7.3 04/07/2022   HGB 16.3 (H) 09/15/2022   HCT 47.5 (H) 09/15/2022   PLT 311 04/07/2022   GLUCOSE 87 06/12/2022   CHOL 163 04/07/2022   TRIG 162 (H) 04/07/2022   HDL 54 04/07/2022   LDLCALC 81 04/07/2022   ALT 6 04/07/2022   AST 13 04/07/2022   NA 137 06/12/2022   K 4.0 06/12/2022   CL 99 06/12/2022   CREATININE 0.91 06/12/2022   BUN 11 06/12/2022   CO2 24 06/12/2022   TSH 0.289 (L) 11/10/2022     Assessment & Plan:  Left ear pain Assessment & Plan: Exam is unrevealing here today.  However, given persistent symptoms I am placing on empiric antibiotic therapy.  This could be related to parotitis. Rx sent for Augmentin .   Other orders -     Amoxicillin -Pot Clavulanate; Take 1 tablet by mouth 2 (two) times daily.  Dispense: 14 tablet; Refill: 0    Follow-up:  Return if symptoms worsen or fail to improve.  Kathleen Papa DO [facility]

## 2023-09-21 ENCOUNTER — Other Ambulatory Visit: Payer: Self-pay | Admitting: Gastroenterology

## 2023-09-21 ENCOUNTER — Other Ambulatory Visit: Payer: Self-pay | Admitting: Internal Medicine

## 2023-09-21 DIAGNOSIS — K219 Gastro-esophageal reflux disease without esophagitis: Secondary | ICD-10-CM

## 2023-09-21 DIAGNOSIS — K5900 Constipation, unspecified: Secondary | ICD-10-CM

## 2023-10-02 ENCOUNTER — Other Ambulatory Visit: Payer: Self-pay | Admitting: Internal Medicine

## 2023-10-15 ENCOUNTER — Ambulatory Visit: Payer: Self-pay

## 2023-10-15 NOTE — Telephone Encounter (Signed)
 Second attempt: LVM for patient to return call to [phone]    Copied from CRM [id number]. Topic: Clinical - Red Word Triage >> Oct 15, 2023 10:16 AM [place] A wrote: [facility] that prompted transfer to Nurse Triage: Patient is having pain and stiffness with neck and back >> Oct 15, 2023 10:27 AM [place] A wrote: Patient disconnected, Agent called patient back no answer. Agent put MRN in [employer]

## 2023-10-15 NOTE — Telephone Encounter (Signed)
 Third attempt to contact patient- [employer] assist has checked with patient and patient states no longer relevant. Triage call will be closed for this reason.

## 2023-10-15 NOTE — Telephone Encounter (Signed)
 1st attempt, LVM    Copied from CRM [id number]. Topic: Clinical - Red Word Triage >> Oct 15, 2023 10:16 AM [place] A wrote: [facility] that prompted transfer to Nurse Triage: Patient is having pain and stiffness with neck and back >> Oct 15, 2023 10:27 AM [place] A wrote: Patient disconnected, Agent called patient back no answer. Agent put MRN in [employer]

## 2023-10-20 LAB — T4, FREE: Free T4: 1.57 ng/dL (ref 0.82–1.77)

## 2023-10-20 LAB — TSH: TSH: 0.398 u[IU]/mL — ABNORMAL LOW (ref 0.450–4.500)

## 2023-10-20 LAB — T3, FREE: T3, Free: 2.7 pg/mL (ref 2.0–4.4)

## 2023-10-25 ENCOUNTER — Encounter: Payer: Self-pay | Admitting: Nurse Practitioner

## 2023-10-25 ENCOUNTER — Ambulatory Visit: Attending: Nurse Practitioner | Admitting: Nurse Practitioner

## 2023-10-25 VITALS — BP 118/68 | HR 88 | Ht 63.0 in | Wt 135.4 lb

## 2023-10-25 DIAGNOSIS — I773 Arterial fibromuscular dysplasia: Secondary | ICD-10-CM | POA: Diagnosis present

## 2023-10-25 DIAGNOSIS — I3139 Other pericardial effusion (noninflammatory): Secondary | ICD-10-CM | POA: Diagnosis present

## 2023-10-25 DIAGNOSIS — I428 Other cardiomyopathies: Secondary | ICD-10-CM | POA: Insufficient documentation

## 2023-10-25 DIAGNOSIS — E785 Hyperlipidemia, unspecified: Secondary | ICD-10-CM | POA: Diagnosis present

## 2023-10-25 DIAGNOSIS — I10 Essential (primary) hypertension: Secondary | ICD-10-CM | POA: Diagnosis present

## 2023-10-25 DIAGNOSIS — I340 Nonrheumatic mitral (valve) insufficiency: Secondary | ICD-10-CM

## 2023-10-25 DIAGNOSIS — I38 Endocarditis, valve unspecified: Secondary | ICD-10-CM | POA: Diagnosis present

## 2023-10-25 NOTE — Patient Instructions (Addendum)
 Medication Instructions:  Your physician recommends that you continue on your current medications as directed. Please refer to the Current Medication list given to you today.  Labwork: In 1 month at [facility]   Testing/Procedures: Your physician has requested that you have an echocardiogram. Echocardiography is a painless test that uses sound waves to create images of your heart. It provides your doctor with information about the size and shape of your heart and how well your heart's chambers and valves are working. This procedure takes approximately one hour. There are no restrictions for this procedure. Please do NOT wear cologne, perfume, aftershave, or lotions (deodorant is allowed). Please arrive 15 minutes prior to your appointment time.  Please note: We ask at that you not bring children with you during ultrasound (echo/ vascular) testing. Due to room size and safety concerns, children are not allowed in the ultrasound rooms during exams. Our front office staff cannot provide observation of children in our lobby area while testing is being conducted. An adult accompanying a patient to their appointment will only be allowed in the ultrasound room at the discretion of the ultrasound technician under special circumstances. We apologize for any inconvenience.  Follow-Up: Your physician recommends that you schedule a follow-up appointment in: 1 Year   Any Other Special Instructions Will Be Listed Below (If Applicable).  If you need a refill on your cardiac medications before your next appointment, please call your pharmacy.

## 2023-10-25 NOTE — Progress Notes (Unsigned)
 Cardiology Office Note   Date:  10/25/2023 ID:  Destiny Manning, DOB [birth year], MRN [id number] PCP: Cook, Jayce G, DO  [facility] Providers Cardiologist:  Vina Gull, MD     History of Present Illness Destiny Manning is a 53 y.o. 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  Last seen by Dr. Gull on Aug 14, 2022.  Was overall doing well at the time.  She underwent MRI later that year, normal LVEF, no suggestions for myopathic process.  Moderate MR/TR/AR.  She presents today for 1 year follow-up. She states she is doing well. Denies any acute cardiac complaints or issues. Denies any chest pain, shortness of breath, palpitations, syncope, presyncope, dizziness, orthopnea, PND, swelling or significant weight changes, acute bleeding, or claudication. She is due for labs.   ROS: Negative. See HPI.   Studies Reviewed  EKG: EKG Interpretation Date/Time:  Thursday October 25 2023 11:26:52 EDT Ventricular Rate:  88 PR Interval:  178 QRS Duration:  70 QT Interval:  374 QTC Calculation: 452 R Axis:   87  Text Interpretation: Normal sinus rhythm Normal ECG When compared with ECG of 05-Jul-2017 09:07, Nonspecific T wave abnormality no longer evident in Lateral leads Confirmed by Miriam Norris [phone]) on 10/25/2023 11:32:43 AM   cMRI 10/2022:  IMPRESSION: 1. Left ventricle is mildly dilated by indexed volume with normal left ventricular systolic function and normal wall thickness. LVEF 56%. Max wall thickness 9 mm. No post contrast delayed myocardial enhancement.   2. Normal right ventricular chamber size with normal right ventricular systolic function, RVEF 55%.   3.  Severe left atrial dilation by visual estimate.   4.  Post inflammatory appearance of the mitral valve with moderate mitral valve regurgitation, regurgitant fraction 31%   5. Tricuspid aortic valve with central moderate aortic valve regurgitation, regurgitant fraction 21%.   6.  Moderate tricuspid valve regurgitation by quantitation.   7. Small pericardial effusion appears circumferential with no definite pericardial enhancement.  Lexiscan 06/2020:  Normal.   Carotid duplex 06/2020:  IMPRESSION: 1. Tortuosity is noted about the distal internal carotid arteries bilaterally with associated elevated flow velocities. No sonographic evidence of significant flow limiting stenosis or atherosclerotic plaque formation. These findings could be seen with fibromuscular dysplasia. 2. 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   Consider CTA neck for further morphologic characterization of the internal carotid arteries.  Risk Assessment/Calculations     The 10-year ASCVD risk score (Arnett DK, et al., 2019) is: 2.3%   Values used to calculate the score:     Age: 72 years     Clincally relevant sex: Female     Is Non-Hispanic African American: Yes     Diabetic: No     Tobacco smoker: No     Systolic Blood Pressure: 118 mmHg     Is BP treated: Yes     HDL Cholesterol: 54 mg/dL     Total Cholesterol: 163 mg/dL  Physical Exam VS:  BP 118/68   Pulse 88   Ht 5' 3 (1.6 m)   Wt 135 lb 6.4 oz (61.4 kg)   LMP 07/04/2010   SpO2 99%   BMI 23.99 kg/m     Wt  Readings from Last 3 Encounters:  10/25/23 135 lb 6.4 oz (61.4 kg)  04/18/23 134 lb (60.8 kg)  12/08/22 135 lb 12.8 oz (61.6 kg)    GEN: Well nourished, well developed in no acute distress NECK: No JVD; No carotid bruits CARDIAC: S1/S2, RRR, no murmurs, rubs, gallops RESPIRATORY:  Clear to auscultation without rales, wheezing or rhonchi  ABDOMEN: Soft, non-tender, non-distended EXTREMITIES:  No edema; No  deformity   ASSESSMENT AND PLAN  NICM Stage C, NYHA class I symptoms. LVEF on cMRI was 56%. Euvolemic and well compensated on exam. Etiology felt to be felt to be peripartum/idiopathic. Continue current medication regimen. Low sodium diet, fluid restriction <2L, and daily weights encouraged. Educated to contact our office for weight gain of 2 lbs overnight or 5 lbs in one week. Will arrange Echo at this time.  Will arrange CMET and CBC.  HTN BP stable. Discussed to monitor BP at home at least 2 hours after medications and sitting for 5-10 minutes.  No medication changes at this time. Heart healthy diet and regular cardiovascular exercise encouraged. Arranging labs as noted above.   Valvular insufficiency, pericardial effusion Cardiac MRI from July 2024 showed moderate AR, moderate TR, small pericardial effusion.  Will arrange echocardiogram at this time.  Denies any symptoms.  Will continue to monitor.  4. HLD She is due for labs to be checked.  Will obtain FLP. Continue atorvastatin . Heart healthy diet and regular cardiovascular exercise encouraged.   5. Cerebrovascular FMD Findings noted to be significant in neck/abdomen.  No complaints or issues.  Continue follow-up with PCP. Will continue to follow.    Dispo: Care and ED precautions discussed. Follow-up with MD/APP in 1 year or sooner if anything changes.   Signed, Almarie Crate, NP

## 2023-10-29 ENCOUNTER — Encounter: Payer: Self-pay | Admitting: Nurse Practitioner

## 2023-10-29 ENCOUNTER — Ambulatory Visit: Admitting: Nurse Practitioner

## 2023-10-29 VITALS — BP 123/68 | HR 83 | Temp 98.2°F | Ht 63.0 in | Wt 137.0 lb

## 2023-10-29 DIAGNOSIS — H6993 Unspecified Eustachian tube disorder, bilateral: Secondary | ICD-10-CM

## 2023-10-29 DIAGNOSIS — M5030 Other cervical disc degeneration, unspecified cervical region: Secondary | ICD-10-CM | POA: Diagnosis not present

## 2023-10-29 DIAGNOSIS — G8929 Other chronic pain: Secondary | ICD-10-CM

## 2023-10-29 DIAGNOSIS — T7840XD Allergy, unspecified, subsequent encounter: Secondary | ICD-10-CM

## 2023-10-29 DIAGNOSIS — M545 Low back pain, unspecified: Secondary | ICD-10-CM

## 2023-10-29 MED ORDER — MONTELUKAST SODIUM 10 MG PO TABS
10.0000 mg | ORAL_TABLET | Freq: Every day | ORAL | 1 refills | Status: AC
Start: 2023-10-29 — End: ?

## 2023-10-29 MED ORDER — BACLOFEN 10 MG PO TABS: ORAL_TABLET | ORAL | 0 refills | Status: DC

## 2023-10-29 MED ORDER — ADVAIR DISKUS 500-50 MCG/ACT IN AEPB: 1.0000 | INHALATION_SPRAY | Freq: Two times a day (BID) | RESPIRATORY_TRACT | 6 refills | Status: AC

## 2023-10-29 MED ORDER — GABAPENTIN 800 MG PO TABS: 800.0000 mg | ORAL_TABLET | Freq: Three times a day (TID) | ORAL | 1 refills | Status: AC

## 2023-10-29 MED ORDER — FLUTICASONE PROPIONATE 50 MCG/ACT NA SUSP: 1.0000 | Freq: Every day | NASAL | 3 refills | Status: AC

## 2023-10-29 MED ORDER — MINOXIDIL FOR WOMEN 5 % EX FOAM: CUTANEOUS | 3 refills | Status: AC

## 2023-10-29 MED ORDER — ALBUTEROL SULFATE (2.5 MG/3ML) 0.083% IN NEBU
2.5000 mg | INHALATION_SOLUTION | Freq: Four times a day (QID) | RESPIRATORY_TRACT | 0 refills | Status: AC | PRN
Start: 2023-10-29 — End: ?

## 2023-10-30 ENCOUNTER — Encounter: Payer: Self-pay | Admitting: Nurse Practitioner

## 2023-10-30 MED ORDER — CETIRIZINE-PSEUDOEPHEDRINE ER 5-120 MG PO TB12: 1.0000 | ORAL_TABLET | Freq: Two times a day (BID) | ORAL | 2 refills | Status: AC | PRN

## 2023-10-30 NOTE — Progress Notes (Signed)
 Subjective:    Patient ID: Destiny Manning, female    DOB: [birth year], 53 y.o.   MRN: [id number]  HPI Presents for routine follow-up.  Continues follow-up with endocrinology for her thyroid  and yearly visits with cardiology.  Allergies well-controlled with current regimen, needs refills.  Her main concern today is a flareup of her neck pain.  Patient fell in the shower when she was 53 years old.  Had surgery on her neck.  Uses a cervical pillow at nighttime.  Has tried stretching exercises.  Currently on gabapentin  and baclofen  for her back pain.  At this time pain is localized to the neck area mainly on the left side.  No numbness or weakness of the arms.  Does computer work during the day.  Gets regular preventive health physicals with gynecology.   Review of Systems  Constitutional:  Positive for fatigue.  Respiratory:  Negative for cough, chest tightness, shortness of breath and wheezing.   Cardiovascular:  Negative for chest pain and leg swelling.  Musculoskeletal:  Positive for arthralgias, back pain, neck pain and neck stiffness.      10/29/2023    9:30 AM  Depression screen PHQ 2/9  Decreased Interest 0  Down, Depressed, Hopeless 0  PHQ - 2 Score 0  Altered sleeping 0  Tired, decreased energy 0  Change in appetite 0  Feeling bad or failure about yourself  0  Trouble concentrating 0  Moving slowly or fidgety/restless 0  Suicidal thoughts 0  PHQ-9 Score 0  Difficult doing work/chores Not difficult at all      10/29/2023    9:30 AM 09/18/2023    8:41 AM 12/08/2022    8:35 AM 06/16/2022   10:37 AM  GAD 7 : Generalized Anxiety Score  Nervous, Anxious, on Edge 0 0 0 0  Control/stop worrying 0 0 0 0  Worry too much - different things 0 0 0 0  Trouble relaxing 0 0 0 0  Restless 0 0 0 0  Easily annoyed or irritable 0 0 0 0  Afraid - awful might happen 0 0 0 0  Total GAD 7 Score 0 0 0 0  Anxiety Difficulty Not difficult at all Not difficult at all Not difficult at all Not  difficult at all    Social History   Tobacco Use   Smoking status: Former    Current packs/day: 0.00    Average packs/day: 0.3 packs/day for 4.0 years (1.0 ttl pk-yrs)    Types: Cigarettes    Start date: 03/14/2005    Quit date: 03/14/2009    Years since quitting: 14.6   Smokeless tobacco: Never  Vaping Use   Vaping status: Never Used  Substance Use Topics   Alcohol use: No   Drug use: No        Objective:   Physical Exam NAD.  Alert, oriented.  Calm cheerful affect.  Lungs clear.  Heart regular rate rhythm.  Lower extremities no edema.  Can perform full active ROM of the neck with tenderness especially with flexion and hyperextension.  Tight tender muscles noted along the left lateral neck and rhomboids, tight tender muscles noted in the right trapezius.  Hand and arm strength 5+ bilaterally.  Sensation grossly intact.  Strong radial pulses. CT scan on 08/30/2020 shows a solid fusion at C5-6 and mild cervical spondylitis. Today's Vitals   10/29/23 0913  BP: 123/68  Pulse: 83  Temp: 98.2 F (36.8 C)  SpO2: 97%  Weight: 137 lb (62.1  kg)  Height: 5' 3 (1.6 m)   Body mass index is 24.27 kg/m.       Assessment & Plan:   Problem List Items Addressed This Visit       Musculoskeletal and Integument   DDD (degenerative disc disease), cervical     Other   Chronic lower back pain   Relevant Medications   baclofen  (LIORESAL ) 10 MG tablet   gabapentin  (NEURONTIN ) 800 MG tablet   Chronic neck pain - Primary   Relevant Medications   baclofen  (LIORESAL ) 10 MG tablet   gabapentin  (NEURONTIN ) 800 MG tablet   Other Relevant Orders   DG Cervical Spine Complete   Other Visit Diagnoses       Allergy, subsequent encounter       Relevant Medications   montelukast  (SINGULAIR ) 10 MG tablet   fluticasone  (FLONASE ) 50 MCG/ACT nasal spray   cetirizine -pseudoephedrine  (ZYRTEC -D) 5-120 MG tablet      Meds ordered this encounter  Medications   ADVAIR  DISKUS 500-50 MCG/ACT  AEPB    Sig: Inhale 1 puff into the lungs 2 (two) times daily. in the morning and at bedtime.    Dispense:  60 each    Refill:  6   albuterol  (PROVENTIL ) (2.5 MG/3ML) 0.083% nebulizer solution    Sig: Take 3 mLs (2.5 mg total) by nebulization every 6 (six) hours as needed for wheezing or shortness of breath.    Dispense:  75 mL    Refill:  0   baclofen  (LIORESAL ) 10 MG tablet    Sig: TAKE 1 TABLET BY MOUTH WITH FOOD OR MILK 3 TIMES DAILY AS NEEDED.    Dispense:  90 tablet    Refill:  0   gabapentin  (NEURONTIN ) 800 MG tablet    Sig: Take 1 tablet (800 mg total) by mouth 3 (three) times daily.    Dispense:  270 tablet    Refill:  1   montelukast  (SINGULAIR ) 10 MG tablet    Sig: Take 1 tablet (10 mg total) by mouth daily.    Dispense:  90 tablet    Refill:  1    This prescription was filled on 05/04/2023. Any refills authorized will be placed on file.   Minoxidil  (MINOXIDIL  FOR WOMEN) 5 % FOAM    Sig: Apply 1/2 capful to scalp/hair once daily.    Dispense:  60 g    Refill:  3   fluticasone  (FLONASE ) 50 MCG/ACT nasal spray    Sig: Place 1 spray into both nostrils daily.    Dispense:  16 g    Refill:  3    Supervising Provider:   ALPHONSA HAMILTON A [[id number]]   cetirizine -pseudoephedrine  (ZYRTEC -D) 5-120 MG tablet    Sig: Take 1 tablet by mouth 2 (two) times daily as needed for allergies.    Dispense:  60 tablet    Refill:  2    Supervising Provider:   ALPHONSA HAMILTON A [[id number]]   Continue regimen for her allergies. Also patient request a refill on her minoxidil  foam. Continue gabapentin  and baclofen  as directed for back and neck pain. X-ray of the cervical spine ordered. Given prescription for a TENS unit with instructions. Continue stretching exercises.  Recommend ice/heat applications and OTC topicals as directed.  Discussed importance of ergonomics while she is on the computer. Return in about 6 months (around 04/30/2024), or if symptoms worsen or fail to improve.

## 2023-11-01 ENCOUNTER — Other Ambulatory Visit: Payer: Self-pay | Admitting: Internal Medicine

## 2023-11-05 ENCOUNTER — Ambulatory Visit (HOSPITAL_COMMUNITY)
Admission: RE | Admit: 2023-11-05 | Discharge: 2023-11-05 | Disposition: A | Discharge status: Home / Self Care | Payer: Medicaid Other | Source: Ambulatory Visit | Attending: "Endocrinology | Admitting: "Endocrinology

## 2023-11-05 ENCOUNTER — Ambulatory Visit: Payer: Self-pay

## 2023-11-05 ENCOUNTER — Ambulatory Visit (HOSPITAL_COMMUNITY)
Admission: RE | Admit: 2023-11-05 | Discharge: 2023-11-05 | Disposition: A | Discharge status: Home / Self Care | Source: Ambulatory Visit | Attending: Nurse Practitioner | Admitting: Nurse Practitioner

## 2023-11-05 ENCOUNTER — Other Ambulatory Visit: Payer: Self-pay | Admitting: "Endocrinology

## 2023-11-05 DIAGNOSIS — M542 Cervicalgia: Secondary | ICD-10-CM | POA: Insufficient documentation

## 2023-11-05 DIAGNOSIS — G8929 Other chronic pain: Secondary | ICD-10-CM | POA: Insufficient documentation

## 2023-11-05 DIAGNOSIS — E042 Nontoxic multinodular goiter: Secondary | ICD-10-CM

## 2023-11-05 NOTE — Telephone Encounter (Signed)
 Pt made aware and is scheduled for FNA on 11/14/23.

## 2023-11-05 NOTE — Telephone Encounter (Signed)
-----   Message from Ranny Earl sent at 11/05/2023  3:52 PM EDT ----- Zada, I reviewed her pre-visit thyroid  ultrasound. Per radiology she will need a FNA of 1.7 cm left lobe nodule. Her prior FNA was on a right lobe nodule. I want her to do this study before her next visit.  I will order. Thanks. ----- Message ----- From: Interface, Rad Results In Sent: 11/05/2023   3:43 PM EDT To: Ethelle LELON Earl, MD

## 2023-11-07 ENCOUNTER — Other Ambulatory Visit: Payer: Self-pay | Admitting: Family Medicine

## 2023-11-07 ENCOUNTER — Ambulatory Visit: Payer: Self-pay | Admitting: Nurse Practitioner

## 2023-11-07 DIAGNOSIS — G43819 Other migraine, intractable, without status migrainosus: Secondary | ICD-10-CM

## 2023-11-14 ENCOUNTER — Ambulatory Visit (HOSPITAL_COMMUNITY): Admission: RE | Admit: 2023-11-14 | Source: Ambulatory Visit

## 2023-11-16 ENCOUNTER — Ambulatory Visit: Payer: Medicaid Other | Admitting: "Endocrinology

## 2023-11-19 ENCOUNTER — Ambulatory Visit: Attending: Nurse Practitioner

## 2023-11-19 DIAGNOSIS — I38 Endocarditis, valve unspecified: Secondary | ICD-10-CM | POA: Diagnosis present

## 2023-11-19 DIAGNOSIS — I3139 Other pericardial effusion (noninflammatory): Secondary | ICD-10-CM | POA: Diagnosis present

## 2023-11-19 LAB — ECHOCARDIOGRAM COMPLETE
AR max vel: 1.64 cm2
AV Area VTI: 1.68 cm2
AV Area mean vel: 1.88 cm2
AV Mean grad: 9 mmHg
AV Peak grad: 18.1 mmHg
AV Vena cont: 0.4 cm
Ao pk vel: 2.13 m/s
Area-P 1/2: 2.68 cm2
Calc EF: 58.8 %
MV M vel: 5.12 m/s
MV Peak grad: 104.9 mmHg
MV VTI: 1.84 cm2
P 1/2 time: 429 ms
S' Lateral: 2.6 cm
Single Plane A2C EF: 59 %
Single Plane A4C EF: 60.1 %

## 2023-11-23 LAB — CBC
Hematocrit: 44.2 % (ref 34.0–46.6)
Hemoglobin: 14.7 g/dL (ref 11.1–15.9)
MCH: 31.5 pg (ref 26.6–33.0)
MCHC: 33.3 g/dL (ref 31.5–35.7)
MCV: 95 fL (ref 79–97)
Platelets: 293 x10E3/uL (ref 150–450)
RBC: 4.67 x10E6/uL (ref 3.77–5.28)
RDW: 14 % (ref 11.7–15.4)
WBC: 6.3 x10E3/uL (ref 3.4–10.8)

## 2023-11-23 LAB — COMPREHENSIVE METABOLIC PANEL WITH GFR
ALT: 6 IU/L (ref 0–32)
AST: 12 IU/L (ref 0–40)
Albumin: 4.2 g/dL (ref 3.8–4.9)
Alkaline Phosphatase: 61 IU/L (ref 44–121)
BUN/Creatinine Ratio: 9 (ref 9–23)
BUN: 8 mg/dL (ref 6–24)
Bilirubin Total: 0.6 mg/dL (ref 0.0–1.2)
CO2: 26 mmol/L (ref 20–29)
Calcium: 8.9 mg/dL (ref 8.7–10.2)
Chloride: 97 mmol/L (ref 96–106)
Creatinine, Ser: 0.93 mg/dL (ref 0.57–1.00)
Globulin, Total: 2.6 g/dL (ref 1.5–4.5)
Glucose: 85 mg/dL (ref 70–99)
Potassium: 3.4 mmol/L — ABNORMAL LOW (ref 3.5–5.2)
Sodium: 137 mmol/L (ref 134–144)
Total Protein: 6.8 g/dL (ref 6.0–8.5)
eGFR: 74 mL/min/1.73 (ref >59)

## 2023-11-23 LAB — LIPID PANEL
Chol/HDL Ratio: 2.8 ratio (ref 0.0–4.4)
Cholesterol, Total: 143 mg/dL (ref 100–199)
HDL: 52 mg/dL (ref >39)
LDL Chol Calc (NIH): 71 mg/dL (ref 0–99)
Triglycerides: 112 mg/dL (ref 0–149)
VLDL Cholesterol Cal: 20 mg/dL (ref 5–40)

## 2023-11-30 ENCOUNTER — Encounter: Payer: Self-pay | Admitting: Radiology

## 2023-11-30 ENCOUNTER — Other Ambulatory Visit: Payer: Self-pay | Admitting: Nurse Practitioner

## 2023-11-30 ENCOUNTER — Other Ambulatory Visit: Payer: Self-pay | Admitting: Internal Medicine

## 2023-11-30 DIAGNOSIS — M545 Low back pain, unspecified: Secondary | ICD-10-CM

## 2023-12-02 ENCOUNTER — Ambulatory Visit: Payer: Self-pay | Admitting: Nurse Practitioner

## 2023-12-03 ENCOUNTER — Encounter (HOSPITAL_COMMUNITY): Payer: Self-pay

## 2023-12-03 ENCOUNTER — Ambulatory Visit (HOSPITAL_COMMUNITY)
Admission: RE | Admit: 2023-12-03 | Discharge: 2023-12-03 | Disposition: A | Discharge status: Home / Self Care | Source: Ambulatory Visit | Attending: "Endocrinology | Admitting: "Endocrinology

## 2023-12-03 DIAGNOSIS — E041 Nontoxic single thyroid nodule: Secondary | ICD-10-CM | POA: Diagnosis present

## 2023-12-03 MED ORDER — LIDOCAINE HCL (PF) 2 % IJ SOLN
10.0000 mL | Freq: Once | INTRAMUSCULAR | Status: AC
  Administered 2023-12-03: 10 mL via INTRADERMAL

## 2023-12-03 MED ORDER — LIDOCAINE HCL (PF) 2 % IJ SOLN
INTRAMUSCULAR | Status: AC
  Filled 2023-12-03: qty 10

## 2023-12-03 NOTE — Progress Notes (Signed)
 Patient brought to US  1 in no distress. Thyroid  biopsy explained, consent obtained. Local anesthetic admin without complication. Samples obtained under US  imaging without difficulty. Following completion, scant presence of blood at site, no discomfort. Bandage placed, escorted to radiology waiting at [facility].

## 2023-12-05 LAB — CYTOLOGY - NON PAP

## 2023-12-11 ENCOUNTER — Encounter: Payer: Self-pay | Admitting: Nurse Practitioner

## 2023-12-11 ENCOUNTER — Ambulatory Visit: Admitting: Nurse Practitioner

## 2023-12-11 VITALS — BP 116/68 | HR 73 | Temp 97.2°F | Ht 63.0 in | Wt 138.0 lb

## 2023-12-11 DIAGNOSIS — Z01419 Encounter for gynecological examination (general) (routine) without abnormal findings: Secondary | ICD-10-CM | POA: Diagnosis not present

## 2023-12-11 NOTE — Progress Notes (Unsigned)
 Subjective:    Patient ID: Destiny Manning, female    DOB: Jul 30, 1970, 53 y.o.   MRN: [id number]  CC: Well Woman Visit  Activity: Patient is involved in stretches, walking, moving around throughout the week Diet: Reports a good diet Meds: No reported issues with current medication Mammogram: completed 2025 Eye Exam: completed in 06/2023 Dental Exam: completed in 08/2023 Colonoscopy: up to date - 2024     Review of Systems  Respiratory:  Negative for cough, chest tightness, shortness of breath and wheezing.   Cardiovascular:  Negative for chest pain and palpitations.  Gastrointestinal:  Negative for constipation, diarrhea, nausea and vomiting.  Genitourinary:  Negative for dysuria, frequency, urgency, vaginal discharge and vaginal pain.      12/11/2023   10:53 AM 10/29/2023    9:30 AM 09/18/2023    8:41 AM 12/08/2022    8:35 AM  GAD 7 : Generalized Anxiety Score  Nervous, Anxious, on Edge 0 0 0 0  Control/stop worrying 0 0 0 0  Worry too much - different things 0 0 0 0  Trouble relaxing 0 0 0 0  Restless 0 0 0 0  Easily annoyed or irritable 0 0 0 0  Afraid - awful might happen 0 0 0 0  Total GAD 7 Score 0 0 0 0  Anxiety Difficulty Not difficult at all Not difficult at all Not difficult at all Not difficult at all        12/11/2023   10:52 AM 10/29/2023    9:30 AM 09/18/2023    8:41 AM  Depression screen PHQ 2/9  Decreased Interest 0 0 0  Down, Depressed, Hopeless 0 0 0  PHQ - 2 Score 0 0 0  Altered sleeping 0 0 0  Tired, decreased energy 0 0 0  Change in appetite 0 0 0  Feeling bad or failure about yourself  0 0 0  Trouble concentrating 0 0 0  Moving slowly or fidgety/restless 0 0 0  Suicidal thoughts 0 0 0  PHQ-9 Score 0 0 0  Difficult doing work/chores Not difficult at all Not difficult at all Not difficult at all    Vitals:   12/11/23 1044  BP: 116/68  Pulse: 73  Temp: (!) 97.2 F (36.2 C)  Height: 5' 3 (1.6 m)  Weight: 62.6 kg  SpO2: 98%  BMI  (Calculated): 24.45       Objective:   Physical Exam Constitutional:      Appearance: Normal appearance.  Cardiovascular:     Rate and Rhythm: Normal rate and regular rhythm.     Heart sounds: Normal heart sounds.  Pulmonary:     Effort: Pulmonary effort is normal.     Breath sounds: Normal breath sounds.  Abdominal:     General: Abdomen is flat.     Palpations: Abdomen is soft.  Genitourinary:    Comments: Pap smear deferred; to be completed with OBGYN Skin:    General: Skin is warm and dry.  Neurological:     Mental Status: She is alert.  Psychiatric:        Mood and Affect: Mood normal.        Behavior: Behavior normal.   Breasts: breasts appear normal, no suspicious masses, no skin or nipple changes or axillary nodes.         Assessment & Plan:   Problem List Items Addressed This Visit   None Visit Diagnoses       Well woman exam    -  Primary      Return in about 1 year (around 12/10/2024) for physical.

## 2023-12-12 ENCOUNTER — Encounter: Payer: Self-pay | Admitting: "Endocrinology

## 2023-12-12 ENCOUNTER — Ambulatory Visit: Admitting: "Endocrinology

## 2023-12-12 VITALS — BP 110/74 | HR 72 | Ht 63.0 in | Wt 137.2 lb

## 2023-12-12 DIAGNOSIS — E042 Nontoxic multinodular goiter: Secondary | ICD-10-CM | POA: Diagnosis not present

## 2023-12-12 NOTE — Progress Notes (Signed)
 12/12/2023, 8:39 AM  Endocrinology follow-up note   Subjective:    Patient ID: Destiny Manning, female    DOB: Dec 11, 1970, PCP Bluford Jacqulyn MATSU, DO   Past Medical History:  Diagnosis Date   Asthma dx 06/04/07   ventolin , singulair    Blood transfusion without reported diagnosis    CHF (congestive heart failure) (HCC) 03/19/2008   a. 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 normalized by repeat imaging and at 55-60% in 04/2019,   DCM (dilated cardiomyopathy) (HCC) dx 06/06/07   EF 10-15%   Degeneration of spine    DJD (degenerative joint disease), lumbar 11/16/2016   chronic lumbar pain   GERD (gastroesophageal reflux disease)    takes Nexium Daily   Goiter 09/09/2020   Hyperlipidemia, unspecified 09/21/2021   Hypertension    Sciatica 09/21/2021   Seasonal allergies    takes zyrtec    Past Surgical History:  Procedure Laterality Date   ABDOMINAL HYSTERECTOMY     fibroids; partial- ovaries intact   BIOPSY  02/17/2021   Procedure: BIOPSY;  Surgeon: Shaaron Lamar HERO, MD;  [facility];  Service: Endoscopy;;   BIOPSY THYROID   2025   CERVICAL SPINE SURGERY  2002   [facility], ruptured disc   CHOLECYSTECTOMY  2001   laparoscopic, [facility]   COLONOSCOPY WITH PROPOFOL  N/A 07/12/2017   tubular adenoma (10 mm polyp in cecum), segmental biopsies negative. 5 year surveillance   COLONOSCOPY WITH PROPOFOL  N/A 10/18/2022   Procedure: COLONOSCOPY WITH PROPOFOL ;  Surgeon: Cindie Carlin POUR, DO;  [facility];  Service: Endoscopy;  Laterality: N/A;  7:30 am, asa 3   ESOPHAGOGASTRODUODENOSCOPY (EGD) WITH ESOPHAGEAL DILATION N/A 09/05/2012   MFM:Dfjoo hiatal hernia s/p esophageal dilation with [address].    ESOPHAGOGASTRODUODENOSCOPY (EGD) WITH PROPOFOL  N/A 07/12/2017   normal esophagus s/p dilation, normal duodenal bulb and second portion of duodenum   ESOPHAGOGASTRODUODENOSCOPY (EGD) WITH PROPOFOL  N/A 02/17/2021    Surgeon: Shaaron Lamar HERO, MD;   Normal esophagus s/p dilation, normal stomach and examined duodenum.   LAPAROSCOPIC BILATERAL SALPINGO OOPHERECTOMY Bilateral 12/15/2020   Procedure: LAPAROSCOPIC BILATERAL SALPINGO OOPHORECTOMY;  Surgeon: Jayne Vonn DEL, MD;  Location: [facility];  Service: Gynecology;  Laterality: Bilateral;   MALONEY DILATION N/A 07/12/2017   Procedure: AGAPITO DILATION;  Surgeon: Shaaron Lamar HERO, MD;  [facility];  Service: Endoscopy;  Laterality: N/A;   MALONEY DILATION N/A 02/17/2021   Procedure: AGAPITO DILATION;  Surgeon: Shaaron Lamar HERO, MD;  [facility];  Service: Endoscopy;  Laterality: N/A;   POLYPECTOMY  07/12/2017   Procedure: POLYPECTOMY;  Surgeon: Shaaron Lamar HERO, MD;  [facility];  Service: Endoscopy;;  Cecal polyp (HS)   POLYPECTOMY  10/18/2022   Procedure: POLYPECTOMY;  Surgeon: Cindie Carlin POUR, DO;  [facility];  Service: Endoscopy;;   SPINE SURGERY     TUBAL LIGATION     VAGINAL HYSTERECTOMY  10/19/2010   Procedure: HYSTERECTOMY VAGINAL;  Surgeon: Vonn DEL Jayne, MD;  Location: [facility];  Service: Gynecology;  Laterality: N/A;   Social History   Socioeconomic History   Marital status: Single    Spouse name: Not on file   Number  of children: 2   Years of education: 16   Highest education level: Not on file  Occupational History   Occupation: [employer]    Comment: Comptroller- works with IVC patients  Tobacco Use   Smoking status: Former    Current packs/day: 0.00    Average packs/day: 0.3 packs/day for 4.0 years (1.0 ttl pk-yrs)    Types: Cigarettes    Start date: 03/14/2005    Quit date: 03/14/2009    Years since quitting: 14.7   Smokeless tobacco: Never  Vaping Use   Vaping status: Never Used  Substance and Sexual Activity   Alcohol use: No   Drug use: No   Sexual activity: Not Currently    Birth control/protection: Surgical    Comment: hyst  Other Topics Concern   Not on file  Social  History Narrative   Comptroller at 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      Lives with 2 children- age 45 and 69; she homeschools them   Social Drivers of Corporate investment banker Strain: Low Risk  (01/04/2022)   Overall Financial Resource Strain (CARDIA)    Difficulty of Paying Living Expenses: Not hard at all  Food Insecurity: No Food Insecurity (01/04/2022)   Hunger Vital Sign    Worried About Running Out of Food in the Last Year: Never true    Ran Out of Food in the Last Year: Never true  Transportation Needs: No Transportation Needs (01/04/2022)   PRAPARE - Administrator, Civil Service (Medical): No    Lack of Transportation (Non-Medical): No  Physical Activity: Inactive (01/04/2022)   Exercise Vital Sign    Days of Exercise per Week: 0 days    Minutes of Exercise per Session: 0 min  Stress: No Stress Concern Present (01/04/2022)   [employer] of Occupational Health - Occupational Stress Questionnaire    Feeling of Stress : Not at all  Social Connections: Moderately Isolated (01/04/2022)   Social Connection and Isolation Panel    Frequency of Communication with Friends and Family: More than three times a week    Frequency of Social Gatherings with Friends and Family: Once a week    Attends Religious Services: 1 to 4 times per year    Active Member of [employer] or Organizations: No    Attends Engineer, structural: Never    Marital Status: Never married   Family History  Problem Relation Age of Onset   Diabetes Mother    Hypertension Mother    Congestive Heart Failure Father 26   Arthritis Father    Dementia Maternal Grandmother    Congestive Heart Failure Paternal Grandmother    Congestive Heart Failure Paternal Grandfather    Cancer Maternal Uncle    Anesthesia problems Neg Hx    Hypotension Neg Hx    Pseudochol deficiency Neg Hx    Malignant hyperthermia Neg Hx    Colon cancer Neg Hx    Colon polyps Neg Hx    Outpatient Encounter  Medications as of 12/12/2023  Medication Sig   ADVAIR  DISKUS 500-50 MCG/ACT AEPB Inhale 1 puff into the lungs 2 (two) times daily. in the morning and at bedtime.   albuterol  (PROVENTIL ) (2.5 MG/3ML) 0.083% nebulizer solution Take 3 mLs (2.5 mg total) by nebulization every 6 (six) hours as needed for wheezing or shortness of breath.   atorvastatin  (LIPITOR) 10 MG tablet TAKE ONE TABLET BY MOUTH ONCE DAILY   baclofen  (LIORESAL ) 10 MG tablet TAKE 1 TABLET  BY MOUTH WITH FOOD OR MILK 3 TIMES DAILY AS NEEDED.   Biotin 89999 MCG TABS Take 10,000 mcg by mouth daily.   carvedilol  (COREG ) 25 MG tablet TAKE ONE TABLET BY MOUTH TWICE DAILY   cetirizine -pseudoephedrine  (ZYRTEC -D) 5-120 MG tablet Take 1 tablet by mouth 2 (two) times daily as needed for allergies.   chlorthalidone  (HYGROTON ) 25 MG tablet Take 0.5 tablets (12.5 mg total) by mouth daily.   DERMA-SMOOTHE/FS SCALP 0.01 % OIL Apply 1 Application topically at bedtime.   estradiol  (ESTRACE ) 2 MG tablet TAKE 1 TABLET BY MOUTH ONCE A DAY.   fluticasone  (FLONASE ) 50 MCG/ACT nasal spray Place 1 spray into both nostrils daily.   gabapentin  (NEURONTIN ) 800 MG tablet Take 1 tablet (800 mg total) by mouth 3 (three) times daily.   losartan  (COZAAR ) 50 MG tablet TAKE ONE TABLET BY MOUTH DAILY   lubiprostone  (AMITIZA ) 8 MCG capsule TAKE (1) CAPSULE BY MOUTH TWICE DAILY.   Minoxidil  (MINOXIDIL  FOR WOMEN) 5 % FOAM Apply 1/2 capful to scalp/hair once daily.   montelukast  (SINGULAIR ) 10 MG tablet Take 1 tablet (10 mg total) by mouth daily.   nystatin  cream (MYCOSTATIN ) Apply 1 Application topically 2 (two) times daily.   ondansetron  (ZOFRAN ) 4 MG tablet Take 1 tablet (4 mg total) by mouth every 8 (eight) hours as needed for nausea or vomiting.   pantoprazole  (PROTONIX ) 40 MG tablet TAKE 1 TABLET BY MOUTH TWICE DAILY BEFORE MEALS.   potassium chloride  SA (KLOR-CON  M) 20 MEQ tablet Take 1 tablet (20 mEq total) by mouth daily.   promethazine  (PHENERGAN ) 25 MG tablet  Take 1 tablet (25 mg total) by mouth every 6 (six) hours as needed for nausea or vomiting.   SUMAtriptan  (IMITREX ) 50 MG tablet TAKE 1 TABLET BY MOUTH TWICE DAILY AS NEEDED FOR MIGRAINE. MAY REPEAT IN 2 HOURS IF HEADACHE PERSISTS OR RECURS.   triamcinolone  ointment (KENALOG ) 0.5 % Apply 1 Application topically 2 (two) times daily.   No facility-administered encounter medications on file as of 12/12/2023.   ALLERGIES: No Known Allergies  VACCINATION STATUS: Immunization History  Administered Date(s) Administered   Influenza,inj,Quad PF,6+ Mos 02/19/2017, 04/07/2022   Moderna Sars-Covid-2 Vaccination 06/25/2019, 07/30/2019, 05/11/2020, 07/29/2020   Tdap 12/08/2022    HPI Destiny Manning is 53 y.o. female who presents today with a medical history as above. she is being seen in follow-up after she was seen in consultation for multinodular goiter requested by Cook, Jayce G, DO.   She was documented to have a benign fine-needle aspiration biopsy of right lobe thyroid  nodule in July 2023.  She was sent for another fine-needle aspiration of left-sided nodule after her last visit ultrasound showed suspicious nodule.  She returns with benign results and thyroid  function test still consistent with euthyroid state.   She has no new complaints.  Her most recent fine-needle aspiration was on December 03, 2023.  She still denies dysphagia, shortness of breath, nor voice change.    she is not on antithyroid medications, nor thyroid  hormone supplements.  She is not a smoker.  Her medical history includes well-controlled hypertension, hyperlipidemia, CHF.  Review of Systems  Constitutional: no recent weight gain/loss, no fatigue, no subjective hyperthermia, no subjective hypothermia Eyes: no blurry vision, no xerophthalmia ENT: no sore throat, no nodules palpated in throat, no dysphagia/odynophagia, no hoarseness   Objective:       12/12/2023    8:27 AM 12/11/2023   10:44 AM 12/03/2023    9:21 AM  Vitals  with BMI  Height 5' 3 5' 3   Weight 137 lbs 3 oz 138 lbs   BMI 24.31 24.45   Systolic 110 116 874  Diastolic 74 68 66  Pulse 72 73     BP 110/74   Pulse 72   Ht 5' 3 (1.6 m)   Wt 137 lb 3.2 oz (62.2 kg)   LMP 07/04/2010   BMI 24.30 kg/m   Wt Readings from Last 3 Encounters:  12/12/23 137 lb 3.2 oz (62.2 kg)  12/11/23 138 lb (62.6 kg)  10/29/23 137 lb (62.1 kg)    Physical Exam  Constitutional:  Body mass index is 24.3 kg/m.,  not in acute distress, normal state of mind Eyes: PERRLA, EOMI, no exophthalmos ENT: moist mucous membranes, + palpable, normal  size thyroid  , no gross cervical lymphadenopathy   CMP ( most recent) CMP     Component Value Date/Time   NA 137 11/22/2023 0946   K 3.4 (L) 11/22/2023 0946   CL 97 11/22/2023 0946   CO2 26 11/22/2023 0946   GLUCOSE 85 11/22/2023 0946   GLUCOSE 102 (H) 05/30/2022 0919   BUN 8 11/22/2023 0946   CREATININE 0.93 11/22/2023 0946   CALCIUM  8.9 11/22/2023 0946   PROT 6.8 11/22/2023 0946   ALBUMIN 4.2 11/22/2023 0946   AST 12 11/22/2023 0946   ALT 6 11/22/2023 0946   ALKPHOS 61 11/22/2023 0946   BILITOT 0.6 11/22/2023 0946   GFRNONAA >60 05/30/2022 0919   GFRAA >60 07/05/2017 0912       Lipid Panel ( most recent) Lipid Panel     Component Value Date/Time   CHOL 143 11/22/2023 0946   TRIG 112 11/22/2023 0946   HDL 52 11/22/2023 0946   CHOLHDL 2.8 11/22/2023 0946   LDLCALC 71 11/22/2023 0946   LABVLDL 20 11/22/2023 0946      Lab Results  Component Value Date   TSH 0.398 (L) 10/19/2023   TSH 0.289 (L) 11/10/2022   TSH 0.480 11/02/2021   TSH 0.504 09/07/2020   FREET4 1.57 10/19/2023   FREET4 1.60 11/10/2022   FREET4 1.63 11/02/2021   FREET4 1.41 09/07/2020     Thyroid  ultrasound on October 13, 2021 IMPRESSION: 1. Large multinodular thyroid  gland most consistent with multinodular goiter. The majority of the nodules do not meet criteria to warrant biopsy or imaging surveillance. However, there are 3  that do meet criteria and they are listed below. 2. Nodule #3 (1.5 cm TI-RADS category 4) in the right medial inferior gland meets criteria to consider fine-needle aspiration biopsy. 3. Nodule #1 and #4 meet criteria for imaging surveillance. Recommend follow-up ultrasound in 1 year.    October 26, 2021, fine-needle aspiration of right inferior lobe nodule. Clinical History: goiter  Specimen Submitted:  A. THYROID , RIGHT INFERIOR LOBE, FINE NEEDLE ASPIRATION:    FINAL MICROSCOPIC DIAGNOSIS:  - Consistent with benign follicular nodule (Bethesda category II)   SPECIMEN ADEQUACY:  Satisfactory for evaluation  Thyroid  ultrasound on October 23, 2022 IMPRESSION: 1. Previously biopsied nodule in the right mid to lower gland demonstrates no interval change. Recommend correlation with prior biopsy results. 2. Previously identified nodules # 1 and # 4 demonstrate no interval change. This exam confirms 1 year of stability. As before, these lesions meet criteria for imaging surveillance. Recommend follow-up ultrasound in 1 year. 3. Nodules # 5 and 7 appear more solid on today's examination. These lesions demonstrate no significant change in size compared to the prior study consistent with  1 year of stability. However, both lesions are now considered TI-RADS category 3 and meet criteria for continued imaging surveillance. Follow-up ultrasound in 1 year.  Fine-needle aspiration biopsy December 03, 2023 FINAL MICROSCOPIC DIAGNOSIS:  - Benign follicular nodule (Bethesda category II)   SPECIMEN ADEQUACY:  Satisfactory for evaluation   Assessment & Plan:   1. Multinodular goiter I reviewed her new and existing thyroid  ultrasound imaging studies as well as her 2 fine-needle aspiration biopsies done nodules on the right and left lobe of her thyroid  all showing benign findings.    -Her previous thyroid  function tests are consistent with euthyroid state.  She will not need thyroid  hormone or  antithyroid intervention at this time.  She was offered simple thyroidectomy for compressive symptoms, however she does not have significant compressive symptoms at this time and she would like to postpone surgical intervention for now.   -She is advised to continue her other medications for hypertension, hyperlipidemia, and CHF. - she is advised to maintain close follow up with Cook, Jayce G, DO for primary care needs.   I spent  18  minutes in the care of the patient today including review of labs from Thyroid  Function, CMP, and other relevant labs ; imaging/biopsy records (current and previous including abstractions from other facilities); face-to-face time discussing  her lab results and symptoms, medications doses, her options of short and long term treatment based on the latest standards of care / guidelines;   and documenting the encounter.  Destiny Manning  participated in the discussions, expressed understanding, and voiced agreement with the above plans.  All questions were answered to her satisfaction. she is encouraged to contact clinic should she have any questions or concerns prior to her return visit.    Follow up plan: Return in about 1 year (around 12/11/2024) for F/U with Pre-visit Labs, Thyroid  / Neck Ultrasound.   Ranny Earl, MD [facility] [address] [place], [place] [address] Phone: [phone]  Fax: [phone]     12/12/2023, 8:39 AM  This note was partially dictated with voice recognition software. Similar sounding words can be transcribed inadequately or may not  be corrected upon review.

## 2023-12-13 ENCOUNTER — Encounter: Payer: Self-pay | Admitting: Nurse Practitioner

## 2023-12-25 ENCOUNTER — Ambulatory Visit: Admitting: Gastroenterology

## 2023-12-31 ENCOUNTER — Other Ambulatory Visit: Payer: Self-pay | Admitting: Family Medicine

## 2023-12-31 DIAGNOSIS — M545 Low back pain, unspecified: Secondary | ICD-10-CM

## 2024-01-01 ENCOUNTER — Other Ambulatory Visit: Payer: Self-pay | Admitting: Internal Medicine

## 2024-01-01 MED ORDER — CARVEDILOL 25 MG PO TABS: 25.0000 mg | ORAL_TABLET | Freq: Two times a day (BID) | ORAL | 3 refills | Status: AC

## 2024-01-02 ENCOUNTER — Other Ambulatory Visit: Payer: Self-pay

## 2024-01-02 MED ORDER — ATORVASTATIN CALCIUM 10 MG PO TABS: 10.0000 mg | ORAL_TABLET | Freq: Every day | ORAL | 0 refills | Status: DC

## 2024-01-16 ENCOUNTER — Ambulatory Visit: Payer: Self-pay

## 2024-01-16 NOTE — Telephone Encounter (Signed)
 Appointment scheduled.

## 2024-01-16 NOTE — Telephone Encounter (Signed)
 FYI Only or Action Required?: Action required by provider: request for appointment.  Patient was last seen in primary care on 12/11/2023 by Mauro Elveria BROCKS, NP.  Called Nurse Triage reporting Otalgia.  Symptoms began several days ago.  Interventions attempted: Nothing.  Symptoms are: unchanged.  Triage Disposition: See Physician Within 24 Hours  Patient/caregiver understands and will follow disposition?: YesCopied from CRM #[id number]. Topic: Clinical - Red Word Triage >> Jan 16, 2024 10:56 AM Winona R wrote: Clogged ears with pain for a few days. Would like to sch an appointment Reason for Disposition  Earache  (Exceptions: Brief ear pain of lasting less than 60 minutes, or earache occurring during air travel.)  Answer Assessment - Initial Assessment Questions Sinus pressure on left side of face with ear pain.      1. LOCATION: Which ear is involved?     Left ear 2. ONSET: When did the ear pain start?      Several days ago  3. SEVERITY: How bad is the pain?  (Scale 1-10; mild, moderate or severe)     3 4. URI SYMPTOMS: Do you have a runny nose or cough?     denies 5. FEVER: Do you have a fever? If Yes, ask: What is your temperature, how was it measured, and when did it start?     Denies 6. CAUSE: Have you been swimming recently?, How often do you use Q-TIPS?, Have you had any recent air travel or scuba diving?     na 7. OTHER SYMPTOMS: Do you have any other symptoms? (e.g., decreased hearing, dizziness, headache, stiff neck, vomiting)     Sinus pressure, migraine  Protocols used: Earache-A-AH

## 2024-01-17 ENCOUNTER — Ambulatory Visit: Admitting: Family Medicine

## 2024-01-17 ENCOUNTER — Encounter: Payer: Self-pay | Admitting: Family Medicine

## 2024-01-17 VITALS — BP 106/68 | HR 86 | Ht 63.0 in | Wt 134.0 lb

## 2024-01-17 DIAGNOSIS — J329 Chronic sinusitis, unspecified: Secondary | ICD-10-CM | POA: Insufficient documentation

## 2024-01-17 DIAGNOSIS — J019 Acute sinusitis, unspecified: Secondary | ICD-10-CM

## 2024-01-17 MED ORDER — AMOXICILLIN-POT CLAVULANATE 875-125 MG PO TABS: 1.0000 | ORAL_TABLET | Freq: Two times a day (BID) | ORAL | 0 refills | Status: AC

## 2024-01-17 NOTE — Progress Notes (Signed)
 Subjective:  Patient ID: Destiny Manning, female    DOB: 08-Jan-1971  Age: 53 y.o. MRN: [id number]  CC:   Chief Complaint  Patient presents with   Ear Pain    Left ear pain with pressure. Pressure in forehead     HPI:  53 year old female presents for evaluation of the above.   5-6 day history of symptoms. Reports ongoing left ear pain and pressure. Has had rhinorrhea, postnasal drip and sinus pain/pressure (left sided) as well. No fever. No relieving factors.   Patient Active Problem List   Diagnosis Date Noted   Sinusitis 01/17/2024   Chronic neck pain 12/09/2022   Hair loss 05/11/2022   History of cardiomyopathy 04/07/2022   Fibromuscular dysplasia 04/07/2022   Atherosclerosis of aorta 04/07/2022   S/P hysterectomy with oophorectomy 01/04/2022   DDD (degenerative disc disease), cervical 09/21/2021   Hyperlipidemia, unspecified 09/21/2021   Migraine 09/21/2021   Chronic right shoulder pain 05/19/2021   Multinodular goiter 09/23/2020   Hypertension 11/16/2016   Asthma in adult, mild persistent, uncomplicated 11/16/2016   Chronic lower back pain 11/16/2016   Gastroesophageal reflux disease 08/13/2012    Social Hx   Social History   Socioeconomic History   Marital status: Single    Spouse name: Not on file   Number of children: 2   Years of education: 16   Highest education level: Not on file  Occupational History   Occupation: [employer]    Comment: Comptroller- works with IVC patients  Tobacco Use   Smoking status: Former    Current packs/day: 0.00    Average packs/day: 0.3 packs/day for 4.0 years (1.0 ttl pk-yrs)    Types: Cigarettes    Start date: 03/14/2005    Quit date: 03/14/2009    Years since quitting: 14.8   Smokeless tobacco: Never  Vaping Use   Vaping status: Never Used  Substance and Sexual Activity   Alcohol use: No   Drug use: No   Sexual activity: Not Currently    Birth control/protection: Surgical    Comment: hyst  Other Topics Concern    Not on file  Social History Narrative   Comptroller at 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      Lives with 2 children- age 79 and 23; she homeschools them   Social Drivers of Corporate investment banker Strain: Low Risk  (01/04/2022)   Overall Financial Resource Strain (CARDIA)    Difficulty of Paying Living Expenses: Not hard at all  Food Insecurity: No Food Insecurity (01/04/2022)   Hunger Vital Sign    Worried About Running Out of Food in the Last Year: Never true    Ran Out of Food in the Last Year: Never true  Transportation Needs: No Transportation Needs (01/04/2022)   PRAPARE - Administrator, Civil Service (Medical): No    Lack of Transportation (Non-Medical): No  Physical Activity: Inactive (01/04/2022)   Exercise Vital Sign    Days of Exercise per Week: 0 days    Minutes of Exercise per Session: 0 min  Stress: No Stress Concern Present (01/04/2022)   [employer] of Occupational Health - Occupational Stress Questionnaire    Feeling of Stress : Not at all  Social Connections: Moderately Isolated (01/04/2022)   Social Connection and Isolation Panel    Frequency of Communication with Friends and Family: More than three times a week    Frequency of Social Gatherings with Friends and Family: Once a week  Attends Religious Services: 1 to 4 times per year    Active Member of Clubs or Organizations: No    Attends Banker Meetings: Never    Marital Status: Never married    Review of Systems Per HPI  Objective:  BP 106/68   Pulse 86   Ht 5' 3 (1.6 m)   Wt 134 lb (60.8 kg)   LMP 07/04/2010   SpO2 99%   BMI 23.74 kg/m      01/17/2024   11:27 AM 12/12/2023    8:27 AM 12/11/2023   10:44 AM  BP/Weight  Systolic BP 106 110 116  Diastolic BP 68 74 68  Wt. (Lbs) 134 137.2 138  BMI 23.74 kg/m2 24.3 kg/m2 24.45 kg/m2    Physical Exam Vitals and nursing note reviewed.  Constitutional:      General: She is not in acute distress.     Appearance: Normal appearance.  HENT:     Head: Normocephalic and atraumatic.     Right Ear: Tympanic membrane normal.     Left Ear: Tympanic membrane normal.     Mouth/Throat:     Pharynx: Oropharynx is clear.  Cardiovascular:     Rate and Rhythm: Normal rate and regular rhythm.  Pulmonary:     Effort: Pulmonary effort is normal.     Breath sounds: Normal breath sounds.  Neurological:     Mental Status: She is alert.  Psychiatric:        Mood and Affect: Mood normal.        Behavior: Behavior normal.     Lab Results  Component Value Date   WBC 6.3 11/22/2023   HGB 14.7 11/22/2023   HCT 44.2 11/22/2023   PLT 293 11/22/2023   GLUCOSE 85 11/22/2023   CHOL 143 11/22/2023   TRIG 112 11/22/2023   HDL 52 11/22/2023   LDLCALC 71 11/22/2023   ALT 6 11/22/2023   AST 12 11/22/2023   NA 137 11/22/2023   K 3.4 (L) 11/22/2023   CL 97 11/22/2023   CREATININE 0.93 11/22/2023   BUN 8 11/22/2023   CO2 26 11/22/2023   TSH 0.398 (L) 10/19/2023     Assessment & Plan:  Acute sinusitis, recurrence not specified, unspecified location Assessment & Plan: Treating with Augmentin .  Orders: -     Amoxicillin -Pot Clavulanate; Take 1 tablet by mouth 2 (two) times daily.  Dispense: 14 tablet; Refill: 0    Follow-up:  Return if symptoms worsen or fail to improve.  Jacqulyn Ahle DO [facility]

## 2024-01-17 NOTE — Assessment & Plan Note (Signed)
 Treating with Augmentin.

## 2024-01-30 NOTE — Progress Notes (Unsigned)
 GI Office Note    Referring Provider: Cook, Jayce G, DO Primary Care Physician:  Cook, Jayce G, DO Primary Gastroenterologist: Lamar HERO.Rourk, MD  Date:  01/31/2024  ID:  Destiny Manning, DOB [birth year], MRN [id number]  Chief Complaint   Chief Complaint  Patient presents with   Follow-up    Follow up. No problems    History of Present Illness  Destiny Manning is a 53 y.o. female with a history of esophageal dysphagia s/p empiric dilations, GERD, chronic constipation, colon polyps, asthma, HLD, HTN, and CHF with peripartum cardiomyopathy with recent EF 60-65% presenting today with no complaints.  Virtual visit August 2023 with current adequately managed on pantoprazole  40 mg once daily and was taking 20 mg of omeprazole at bedtime without improvement.  Having daily GERD symptoms and intermittent nausea.  Being careful with her diet.  Also notes some intermittent dysphagia.  Plan was to increase PPI to twice daily.  Last office visit 08/09/2022 with Josette Centers, PA-C.  Reflux well-controlled with pantoprazole  40 mg twice daily but able to tell if she misses a dose.  Little nausea with breakthrough symptoms.  No significant dysphagia, does well if she eats small bites.  Having daily soft bowel movements taking Amitiza  twice daily.  Advised to schedule colonoscopy, continue PPI twice daily.  Use Tums or famotidine for breakthrough.  Reinforced GERD diet.  Zofran  as needed for nausea and continue Amitiza  8 mcg twice daily.  Follow-up in 1 year.   Colonoscopy July 2024: - Nonbleeding internal hemorrhoids - 6 mm polyp in the descending colon (tubular adenoma) - Exam otherwise normal - Repeat colonoscopy in 5 years - Follow-up OV in 1 year.  Today:  Discussed the use of AI scribe software for clinical note transcription with the patient, who gave verbal consent to proceed.  She experiences difficulty swallowing both solids and liquids, occurring with every meal and drink. She needs to  stretch her neck to help food and liquids pass, noting discomfort as it sits in her esophagus before passing. Will have to sit up very straight as well. She has a history of esophageal dilation, with the last upper endoscopy performed in 2022. She has not undergone esophageal manometry. Various medications have been tried, including Dexilant , pantoprazole , omeprazole, and Pepcid.  She is currently taking pantoprazole  twice daily for acid reflux but continues to experience burning and nausea, particularly in the lower stomach. Previous medications include omeprazole, Dexilant , and Nexium, with Nexium losing effectiveness over time. She has not tried adding Pepcid or famotidine to her regimen. Has occasional nausea as well and when severe Zofran  is helpful.  For constipation, she takes Amitiza  8 mcg twice daily, which is effective as long as doses are not missed. Missing doses results in constipation and associated discomfort. No significant bloating or pain is reported while on Amitiza , and there is no blood or black stools.  She is on government assistance and has had her work hours reduced, impacting her ability to afford medications. She is concerned about the cost of over-the-counter medications and is interested in prescription options that might be covered by insurance.      Wt Readings from Last 6 Encounters:  01/31/24 134 lb 9.6 oz (61.1 kg)  01/17/24 134 lb (60.8 kg)  12/12/23 137 lb 3.2 oz (62.2 kg)  12/11/23 138 lb (62.6 kg)  10/29/23 137 lb (62.1 kg)  10/25/23 135 lb 6.4 oz (61.4 kg)    Body mass index is 23.84 kg/m.  Current Outpatient Medications  Medication Sig Dispense Refill   ADVAIR  DISKUS 500-50 MCG/ACT AEPB Inhale 1 puff into the lungs 2 (two) times daily. in the morning and at bedtime. 60 each 6   albuterol  (PROVENTIL ) (2.5 MG/3ML) 0.083% nebulizer solution Take 3 mLs (2.5 mg total) by nebulization every 6 (six) hours as needed for wheezing or shortness of breath. 75 mL  0   atorvastatin  (LIPITOR) 10 MG tablet Take 1 tablet (10 mg total) by mouth daily. 90 tablet 0   baclofen  (LIORESAL ) 10 MG tablet TAKE 1 TABLET BY MOUTH WITH FOOD OR MILK 3 TIMES DAILY AS NEEDED. 90 tablet 0   Biotin 89999 MCG TABS Take 10,000 mcg by mouth daily.     carvedilol  (COREG ) 25 MG tablet Take 1 tablet (25 mg total) by mouth 2 (two) times daily. 180 tablet 3   cetirizine -pseudoephedrine  (ZYRTEC -D) 5-120 MG tablet Take 1 tablet by mouth 2 (two) times daily as needed for allergies. 60 tablet 2   chlorthalidone  (HYGROTON ) 25 MG tablet Take 0.5 tablets (12.5 mg total) by mouth daily. 45 tablet 2   DERMA-SMOOTHE/FS SCALP 0.01 % OIL Apply 1 Application topically at bedtime.     estradiol  (ESTRACE ) 2 MG tablet TAKE 1 TABLET BY MOUTH ONCE A DAY. 30 tablet 11   fluticasone  (FLONASE ) 50 MCG/ACT nasal spray Place 1 spray into both nostrils daily. 16 g 3   gabapentin  (NEURONTIN ) 800 MG tablet Take 1 tablet (800 mg total) by mouth 3 (three) times daily. 270 tablet 1   losartan  (COZAAR ) 50 MG tablet TAKE ONE TABLET BY MOUTH DAILY 90 tablet 0   lubiprostone  (AMITIZA ) 8 MCG capsule TAKE (1) CAPSULE BY MOUTH TWICE DAILY. 180 capsule 3   Minoxidil  (MINOXIDIL  FOR WOMEN) 5 % FOAM Apply 1/2 capful to scalp/hair once daily. 60 g 3   montelukast  (SINGULAIR ) 10 MG tablet Take 1 tablet (10 mg total) by mouth daily. 90 tablet 1   nystatin  cream (MYCOSTATIN ) Apply 1 Application topically 2 (two) times daily. 30 g 0   ondansetron  (ZOFRAN ) 4 MG tablet Take 1 tablet (4 mg total) by mouth every 8 (eight) hours as needed for nausea or vomiting. 30 tablet 1   pantoprazole  (PROTONIX ) 40 MG tablet TAKE 1 TABLET BY MOUTH TWICE DAILY BEFORE MEALS. 180 tablet 3   potassium chloride  SA (KLOR-CON  M) 20 MEQ tablet Take 1 tablet (20 mEq total) by mouth daily. 90 tablet 3   promethazine  (PHENERGAN ) 25 MG tablet Take 1 tablet (25 mg total) by mouth every 6 (six) hours as needed for nausea or vomiting. 30 tablet 1   SUMAtriptan   (IMITREX ) 50 MG tablet TAKE 1 TABLET BY MOUTH TWICE DAILY AS NEEDED FOR MIGRAINE. MAY REPEAT IN 2 HOURS IF HEADACHE PERSISTS OR RECURS. 20 tablet 2   triamcinolone  ointment (KENALOG ) 0.5 % Apply 1 Application topically 2 (two) times daily. 30 g 0   amoxicillin -clavulanate (AUGMENTIN ) 875-125 MG tablet Take 1 tablet by mouth 2 (two) times daily. (Patient not taking: Reported on 01/31/2024) 14 tablet 0   No current facility-administered medications for this visit.    Past Medical History:  Diagnosis Date   Asthma dx 06/04/07   ventolin , singulair    Blood transfusion without reported diagnosis    CHF (congestive heart failure) (HCC) 03/19/2008   a. 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 normalized by repeat imaging and at 55-60% in 04/2019,   DCM (dilated cardiomyopathy) (HCC) dx 06/06/07   EF 10-15%   Degeneration  of spine    DJD (degenerative joint disease), lumbar 11/16/2016   chronic lumbar pain   GERD (gastroesophageal reflux disease)    takes Nexium Daily   Goiter 09/09/2020   Hyperlipidemia, unspecified 09/21/2021   Hypertension    Sciatica 09/21/2021   Seasonal allergies    takes zyrtec     Past Surgical History:  Procedure Laterality Date   ABDOMINAL HYSTERECTOMY     fibroids; partial- ovaries intact   BIOPSY  02/17/2021   Procedure: BIOPSY;  Surgeon: Shaaron Lamar HERO, MD;  Location: [facility];  Service: Endoscopy;;   BIOPSY THYROID   2025   CERVICAL SPINE SURGERY  2002   [facility], ruptured disc   CHOLECYSTECTOMY  2001   laparoscopic, [facility]   COLONOSCOPY WITH PROPOFOL  N/A 07/12/2017   tubular adenoma (10 mm polyp in cecum), segmental biopsies negative. 5 year surveillance   COLONOSCOPY WITH PROPOFOL  N/A 10/18/2022   Procedure: COLONOSCOPY WITH PROPOFOL ;  Surgeon: Cindie Carlin POUR, DO;  [facility]: [facility];  Service: Endoscopy;  Laterality: N/A;  7:30 am, asa 3   ESOPHAGOGASTRODUODENOSCOPY (EGD) WITH ESOPHAGEAL DILATION N/A 09/05/2012   MFM:Dfjoo  hiatal hernia s/p esophageal dilation with 56 [address].    ESOPHAGOGASTRODUODENOSCOPY (EGD) WITH PROPOFOL  N/A 07/12/2017   normal esophagus s/p dilation, normal duodenal bulb and second portion of duodenum   ESOPHAGOGASTRODUODENOSCOPY (EGD) WITH PROPOFOL  N/A 02/17/2021   Surgeon: Shaaron Lamar HERO, MD;   Normal esophagus s/p dilation, normal stomach and examined duodenum.   LAPAROSCOPIC BILATERAL SALPINGO OOPHERECTOMY Bilateral 12/15/2020   Procedure: LAPAROSCOPIC BILATERAL SALPINGO OOPHORECTOMY;  Surgeon: Jayne Vonn DEL, MD;  Location: [facility];  Service: Gynecology;  Laterality: Bilateral;   MALONEY DILATION N/A 07/12/2017   Procedure: AGAPITO DILATION;  Surgeon: Shaaron Lamar HERO, MD;  [facility];  Service: Endoscopy;  Laterality: N/A;   MALONEY DILATION N/A 02/17/2021   Procedure: AGAPITO DILATION;  Surgeon: Shaaron Lamar HERO, MD;  [facility];  Service: Endoscopy;  Laterality: N/A;   POLYPECTOMY  07/12/2017   Procedure: POLYPECTOMY;  Surgeon: Shaaron Lamar HERO, MD;  [facility];  Service: Endoscopy;;  Cecal polyp (HS)   POLYPECTOMY  10/18/2022   Procedure: POLYPECTOMY;  Surgeon: Cindie Carlin POUR, DO;  [facility];  Service: Endoscopy;;   SPINE SURGERY     TUBAL LIGATION     VAGINAL HYSTERECTOMY  10/19/2010   Procedure: HYSTERECTOMY VAGINAL;  Surgeon: Vonn DEL Jayne, MD;  Location: [facility];  Service: Gynecology;  Laterality: N/A;    Family History  Problem Relation Age of Onset   Diabetes Mother    Hypertension Mother    Congestive Heart Failure Father 69   Arthritis Father    Dementia Maternal Grandmother    Congestive Heart Failure Paternal Grandmother    Congestive Heart Failure Paternal Grandfather    Cancer Maternal Uncle    Anesthesia problems Neg Hx    Hypotension Neg Hx    Pseudochol deficiency Neg Hx    Malignant hyperthermia Neg Hx    Colon cancer Neg Hx    Colon polyps Neg Hx     Allergies as of 01/31/2024   (No Known  Allergies)    Social History   Socioeconomic History   Marital status: Single    Spouse name: Not on file   Number of children: 2   Years of education: 16   Highest education level: Not on file  Occupational History   Occupation: [employer]    Comment: Comptroller- works  with IVC patients  Tobacco Use   Smoking status: Former    Current packs/day: 0.00    Average packs/day: 0.3 packs/day for 4.0 years (1.0 ttl pk-yrs)    Types: Cigarettes    Start date: 03/14/2005    Quit date: 03/14/2009    Years since quitting: 14.8   Smokeless tobacco: Never  Vaping Use   Vaping status: Never Used  Substance and Sexual Activity   Alcohol use: No   Drug use: No   Sexual activity: Not Currently    Birth control/protection: Surgical    Comment: hyst  Other Topics Concern   Not on file  Social History Narrative   Comptroller at 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      Lives with 2 children- age 48 and 50; she homeschools them   Social Drivers of Corporate investment banker Strain: Low Risk  (01/04/2022)   Overall Financial Resource Strain (CARDIA)    Difficulty of Paying Living Expenses: Not hard at all  Food Insecurity: No Food Insecurity (01/04/2022)   Hunger Vital Sign    Worried About Running Out of Food in the Last Year: Never true    Ran Out of Food in the Last Year: Never true  Transportation Needs: No Transportation Needs (01/04/2022)   PRAPARE - Administrator, Civil Service (Medical): No    Lack of Transportation (Non-Medical): No  Physical Activity: Inactive (01/04/2022)   Exercise Vital Sign    Days of Exercise per Week: 0 days    Minutes of Exercise per Session: 0 min  Stress: No Stress Concern Present (01/04/2022)   [employer] of Occupational Health - Occupational Stress Questionnaire    Feeling of Stress : Not at all  Social Connections: Moderately Isolated (01/04/2022)   Social Connection and Isolation Panel    Frequency of Communication with  Friends and Family: More than three times a week    Frequency of Social Gatherings with Friends and Family: Once a week    Attends Religious Services: 1 to 4 times per year    Active Member of [employer] or Organizations: No    Attends Banker Meetings: Never    Marital Status: Never married    Review of Systems   Gen: Denies fever, chills, anorexia. Denies fatigue, weakness, weight loss.  CV: Denies chest pain, palpitations, syncope, peripheral edema, and claudication. Resp: Denies dyspnea at rest, cough, wheezing, coughing up blood, and pleurisy. GI: See HPI Derm: Denies rash, itching, dry skin Psych: Denies depression, anxiety, memory loss, confusion. No homicidal or suicidal ideation.  Heme: Denies bruising, bleeding, and enlarged lymph nodes.  Physical Exam   BP 104/65 (BP Location: Right Arm, Patient Position: Sitting, Cuff Size: Normal)   Pulse 77   Temp 97.6 F (36.4 C) (Temporal)   Ht 5' 3 (1.6 m)   Wt 134 lb 9.6 oz (61.1 kg)   LMP 07/04/2010   BMI 23.84 kg/m   General:   Alert and oriented. No distress noted. Pleasant and cooperative.  Head:  Normocephalic and atraumatic. Eyes:  Conjuctiva clear without scleral icterus. Mouth:  Oral mucosa pink and moist. Good dentition. No lesions. Abdomen:  +BS, soft, non-tender and non-distended. No rebound or guarding. No HSM or masses noted. Rectal: deferred Msk:  Symmetrical without gross deformities. Normal posture. Extremities:  Without edema Neurologic:  Alert and  oriented x4 Psych:  Alert and cooperative. Normal mood and affect.  Assessment & Plan  Destiny Manning is a  53 y.o. female presenting today  for follow-up with reflux, dysphagia, and constipation.    Dysphagia Chronic dysphagia with difficulty swallowing solids and liquids, requiring multiple prior esophageal dilations. No prior esophageal manometry performed. - Trial of Pepcid in addition to pantorazole 40 mg twice daily; repeat upper endoscopy if  symptoms persist. - Consider esophageal manometry with sedation if upper endoscopy does not resolve symptoms. - Other PPI options include Aciphex or lansoprazole. Could try alternative Voquezna as well.   Gastroesophageal reflux disease (GERD) Chronic GERD with persistent symptoms despite pantoprazole  twice daily. Previous trials of multiple PPIs including Dexilant , Prilosec, and Nexium. - Add famotidine (Pepcid) 20 mg midday or evening to current pantoprazole  regimen. - Send prescription for famotidine to check insurance coverage - obtain otc if not covered or too expensive.   Chronic constipation Chronic constipation well-managed with Amitiza  8 mcg twice daily. Symptoms recur if doses are missed, but no significant bloating or pain when compliant.  Intermittent nausea Intermittent nausea associated with GERD symptoms, managed with ondansetron  as needed. - Refill ondansetron  prescription for as-needed use.      Follow up   Follow up 2-3 months.    Charmaine Melia, MSN, FNP-BC, AGACNP-BC [facility]

## 2024-01-31 ENCOUNTER — Ambulatory Visit: Admitting: Gastroenterology

## 2024-01-31 ENCOUNTER — Encounter: Payer: Self-pay | Admitting: Gastroenterology

## 2024-01-31 VITALS — BP 104/65 | HR 77 | Temp 97.6°F | Ht 63.0 in | Wt 134.6 lb

## 2024-01-31 DIAGNOSIS — R110 Nausea: Secondary | ICD-10-CM | POA: Diagnosis not present

## 2024-01-31 DIAGNOSIS — R112 Nausea with vomiting, unspecified: Secondary | ICD-10-CM

## 2024-01-31 DIAGNOSIS — K219 Gastro-esophageal reflux disease without esophagitis: Secondary | ICD-10-CM

## 2024-01-31 DIAGNOSIS — R1310 Dysphagia, unspecified: Secondary | ICD-10-CM | POA: Diagnosis not present

## 2024-01-31 DIAGNOSIS — K5909 Other constipation: Secondary | ICD-10-CM | POA: Diagnosis not present

## 2024-01-31 DIAGNOSIS — R1319 Other dysphagia: Secondary | ICD-10-CM

## 2024-01-31 DIAGNOSIS — K5900 Constipation, unspecified: Secondary | ICD-10-CM

## 2024-01-31 MED ORDER — ONDANSETRON 4 MG PO TBDP: 4.0000 mg | ORAL_TABLET | Freq: Three times a day (TID) | ORAL | 2 refills | Status: AC | PRN

## 2024-01-31 MED ORDER — FAMOTIDINE 20 MG PO TABS: 20.0000 mg | ORAL_TABLET | Freq: Every day | ORAL | 2 refills | Status: AC

## 2024-01-31 MED ORDER — PANTOPRAZOLE SODIUM 40 MG PO TBEC
40.0000 mg | DELAYED_RELEASE_TABLET | Freq: Two times a day (BID) | ORAL | 3 refills | Status: AC
Start: 2024-01-31 — End: ?

## 2024-01-31 NOTE — Patient Instructions (Addendum)
 I have sent in refills of pantoprazole  and Zofran  for you.  I also attempted to send in the famotidine prescription for you, if they do not cover it or the cost is more than the 50 tablets over-the-counter then please just do the 15 tablets over-the-counter.  The famotidine you may take either midday about 30 minutes prior to a meal or 30 minutes to 1 hour prior to bed depending on when you are having more burning symptoms.   Continue the Amitiza  8 mcg twice daily.  We will follow-up in 2-3 months.  If you are still having some difficulty with swallowing at that time we will plan on upper endoscopy with dilation.  If at any point you are eating or drinking and you are not able to keep anything down please go to the ED immediately to be assessed for any food impaction.  If your symptoms significantly worsen prior to our follow-up please let me know and I am more than happy to go ahead and schedule an upper endoscopy with dilation for you.  Continue to follow a GERD diet:  Avoid fried, fatty, greasy, spicy, citrus foods. Avoid caffeine and carbonated beverages. Avoid chocolate. Try eating 4-6 small meals a day rather than 3 large meals. Do not eat within 3 hours of laying down. Prop head of bed up on wood or bricks to create a 6 inch incline.  It was a pleasure to see you today. I want to create trusting relationships with patients. If you receive a survey regarding your visit,  I greatly appreciate you taking time to fill this out on paper or through your MyChart. I value your feedback.  Charmaine Melia, MSN, FNP-BC, AGACNP-BC [facility]

## 2024-02-11 ENCOUNTER — Encounter: Payer: Self-pay | Admitting: Radiology

## 2024-02-19 ENCOUNTER — Encounter: Payer: Self-pay | Admitting: Gastroenterology

## 2024-02-27 ENCOUNTER — Other Ambulatory Visit: Payer: Self-pay | Admitting: Obstetrics & Gynecology

## 2024-02-29 ENCOUNTER — Other Ambulatory Visit: Payer: Self-pay | Admitting: Family Medicine

## 2024-02-29 DIAGNOSIS — G8929 Other chronic pain: Secondary | ICD-10-CM

## 2024-03-20 ENCOUNTER — Other Ambulatory Visit: Payer: Self-pay | Admitting: Internal Medicine

## 2024-03-30 ENCOUNTER — Other Ambulatory Visit: Payer: Self-pay | Admitting: Family Medicine

## 2024-03-30 DIAGNOSIS — G8929 Other chronic pain: Secondary | ICD-10-CM

## 2024-03-31 ENCOUNTER — Other Ambulatory Visit: Payer: Self-pay

## 2024-04-01 MED ORDER — ATORVASTATIN CALCIUM 10 MG PO TABS: 10.0000 mg | ORAL_TABLET | Freq: Every day | ORAL | 1 refills | Status: AC

## 2024-04-28 ENCOUNTER — Ambulatory Visit: Admitting: Adult Health

## 2024-04-29 ENCOUNTER — Ambulatory Visit: Admitting: Nurse Practitioner

## 2024-04-30 ENCOUNTER — Ambulatory Visit: Admitting: Family Medicine

## 2024-05-22 ENCOUNTER — Ambulatory Visit: Admitting: Nurse Practitioner

## 2024-10-15 ENCOUNTER — Other Ambulatory Visit (HOSPITAL_COMMUNITY)

## 2024-10-24 ENCOUNTER — Ambulatory Visit: Admitting: Nurse Practitioner

## 2024-12-11 ENCOUNTER — Ambulatory Visit: Admitting: "Endocrinology
